# Patient Record
Sex: Female | Born: 1965
Health system: Southern US, Community
[De-identification: ages and names within clinical notes are randomized; demographics above are authoritative.]

## PROBLEM LIST (undated history)

## (undated) DIAGNOSIS — D219 Benign neoplasm of connective and other soft tissue, unspecified: Secondary | ICD-10-CM

## (undated) DIAGNOSIS — K581 Irritable bowel syndrome with constipation: Secondary | ICD-10-CM

## (undated) DIAGNOSIS — IMO0001 Reserved for inherently not codable concepts without codable children: Secondary | ICD-10-CM

## (undated) DIAGNOSIS — F329 Major depressive disorder, single episode, unspecified: Secondary | ICD-10-CM

## (undated) DIAGNOSIS — J45991 Cough variant asthma: Secondary | ICD-10-CM

## (undated) DIAGNOSIS — N838 Other noninflammatory disorders of ovary, fallopian tube and broad ligament: Secondary | ICD-10-CM

## (undated) DIAGNOSIS — F411 Generalized anxiety disorder: Secondary | ICD-10-CM

## (undated) DIAGNOSIS — R102 Pelvic and perineal pain unspecified side: Secondary | ICD-10-CM

## (undated) DIAGNOSIS — D860 Sarcoidosis of lung: Secondary | ICD-10-CM

## (undated) DIAGNOSIS — K219 Gastro-esophageal reflux disease without esophagitis: Secondary | ICD-10-CM

## (undated) DIAGNOSIS — K589 Irritable bowel syndrome without diarrhea: Secondary | ICD-10-CM

## (undated) DIAGNOSIS — Z86018 Personal history of other benign neoplasm: Secondary | ICD-10-CM

## (undated) DIAGNOSIS — J45909 Unspecified asthma, uncomplicated: Secondary | ICD-10-CM

## (undated) DIAGNOSIS — F988 Other specified behavioral and emotional disorders with onset usually occurring in childhood and adolescence: Secondary | ICD-10-CM

## (undated) HISTORY — DX: Benign neoplasm of connective and other soft tissue, unspecified: D21.9

## (undated) HISTORY — PX: WISDOM TOOTH EXTRACTION: SHX21

---

## 1998-11-30 ENCOUNTER — Other Ambulatory Visit: Admission: RE | Admit: 1998-11-30 | Discharge: 1998-11-30 | Payer: Self-pay | Admitting: *Deleted

## 1999-01-17 ENCOUNTER — Emergency Department (HOSPITAL_COMMUNITY): Admission: EM | Admit: 1999-01-17 | Discharge: 1999-01-17 | Payer: Self-pay | Admitting: Emergency Medicine

## 1999-01-17 ENCOUNTER — Encounter: Payer: Self-pay | Admitting: Emergency Medicine

## 1999-12-20 ENCOUNTER — Other Ambulatory Visit: Admission: RE | Admit: 1999-12-20 | Discharge: 1999-12-20 | Payer: Self-pay | Admitting: *Deleted

## 2000-01-30 ENCOUNTER — Other Ambulatory Visit: Admission: RE | Admit: 2000-01-30 | Discharge: 2000-01-30 | Payer: Self-pay | Admitting: Obstetrics and Gynecology

## 2000-01-30 ENCOUNTER — Encounter (INDEPENDENT_AMBULATORY_CARE_PROVIDER_SITE_OTHER): Payer: Self-pay

## 2000-05-29 ENCOUNTER — Other Ambulatory Visit: Admission: RE | Admit: 2000-05-29 | Discharge: 2000-05-29 | Payer: Self-pay | Admitting: *Deleted

## 2000-07-03 ENCOUNTER — Encounter: Payer: Self-pay | Admitting: *Deleted

## 2000-07-03 ENCOUNTER — Encounter: Admission: RE | Admit: 2000-07-03 | Discharge: 2000-07-03 | Payer: Self-pay | Admitting: *Deleted

## 2000-11-03 ENCOUNTER — Other Ambulatory Visit: Admission: RE | Admit: 2000-11-03 | Discharge: 2000-11-03 | Payer: Self-pay | Admitting: Obstetrics and Gynecology

## 2006-02-07 ENCOUNTER — Emergency Department (HOSPITAL_COMMUNITY): Admission: EM | Admit: 2006-02-07 | Discharge: 2006-02-07 | Payer: Self-pay | Admitting: Family Medicine

## 2006-09-20 ENCOUNTER — Emergency Department (HOSPITAL_COMMUNITY): Admission: EM | Admit: 2006-09-20 | Discharge: 2006-09-20 | Payer: Self-pay | Admitting: Emergency Medicine

## 2012-04-01 DIAGNOSIS — D860 Sarcoidosis of lung: Secondary | ICD-10-CM

## 2012-04-01 HISTORY — DX: Sarcoidosis of lung: D86.0

## 2012-08-30 DIAGNOSIS — Z87898 Personal history of other specified conditions: Secondary | ICD-10-CM

## 2012-08-30 HISTORY — DX: Personal history of other specified conditions: Z87.898

## 2012-09-25 ENCOUNTER — Emergency Department (HOSPITAL_COMMUNITY)
Admission: EM | Admit: 2012-09-25 | Discharge: 2012-09-25 | Disposition: A | Discharge status: Home / Self Care | Payer: 59 | Attending: Emergency Medicine | Admitting: Emergency Medicine

## 2012-09-25 ENCOUNTER — Encounter (HOSPITAL_COMMUNITY): Payer: Self-pay | Admitting: *Deleted

## 2012-09-25 DIAGNOSIS — R339 Retention of urine, unspecified: Secondary | ICD-10-CM | POA: Insufficient documentation

## 2012-09-25 DIAGNOSIS — Z3202 Encounter for pregnancy test, result negative: Secondary | ICD-10-CM | POA: Insufficient documentation

## 2012-09-25 DIAGNOSIS — Z79899 Other long term (current) drug therapy: Secondary | ICD-10-CM | POA: Insufficient documentation

## 2012-09-25 DIAGNOSIS — J45909 Unspecified asthma, uncomplicated: Secondary | ICD-10-CM | POA: Insufficient documentation

## 2012-09-25 HISTORY — DX: Unspecified asthma, uncomplicated: J45.909

## 2012-09-25 LAB — URINALYSIS, ROUTINE W REFLEX MICROSCOPIC
Bilirubin Urine: NEGATIVE
Ketones, ur: NEGATIVE mg/dL
Leukocytes, UA: NEGATIVE
Nitrite: NEGATIVE
Urobilinogen, UA: 0.2 mg/dL (ref 0.0–1.0)
pH: 6 (ref 5.0–8.0)

## 2012-09-25 NOTE — ED Provider Notes (Signed)
History    CSN: [id number] Arrival date & time 09/25/12  1916  First MD Initiated Contact with Patient 09/25/12 1921     Chief Complaint  Patient presents with  . Urinary Retention   (Consider location/radiation/quality/duration/timing/severity/associated sxs/prior Treatment) HPI Comments: Pt presents with acute onset of urinary retention which started at 4 PM this afternoon when trying to urinate - she has never had this problem in the past, has no new medications and no dysuria / fever / n/v. She has no back pain.  Sx are constant, nothing makes better or worse.  Denies being pregnant - has never been pregnant.  The history is provided by the patient and the spouse.   Past Medical History  Diagnosis Date  . Asthma    History reviewed. No pertinent past surgical history. No family history on file. History  Substance Use Topics  . Smoking status: Never Smoker   . Smokeless tobacco: Not on file  . Alcohol Use: Not on file     Comment: occassional   OB History   Grav Para Term Preterm Abortions TAB SAB Ect Mult Living                 Review of Systems  All other systems reviewed and are negative.    Allergies  Excedrin back &  Home Medications   Current Outpatient Rx  Name  Route  Sig  Dispense  Refill  . albuterol (PROVENTIL HFA) 108 (90 BASE) MCG/ACT inhaler   Inhalation   Inhale 2 puffs into the lungs every 6 (six) hours as needed for wheezing or shortness of breath.         . Calcium Carbonate-Vitamin D (CALCIUM 600 + D PO)   Oral   Take 1 tablet by mouth daily.         . Multiple Vitamin (MULTIVITAMIN WITH MINERALS) TABS   Oral   Take 1 tablet by mouth daily.         . Phenazopyridine HCl (AZO TABS PO)   Oral   Take 1 tablet by mouth daily as needed (FOR URINARY PAIN RELIEF).          BP 118/66  Pulse 79  Temp(Src) 98.7 F (37.1 C) (Oral)  Resp 20  Ht 5\' 6"  (1.676 m)  Wt 125 lb (56.7 kg)  BMI 20.19 kg/m2  SpO2 99%  LMP  09/18/2012 Physical Exam  Nursing note and vitals reviewed. Constitutional: She appears well-developed and well-nourished.  Uncomfortable appearing  HENT:  Head: Normocephalic and atraumatic.  Mouth/Throat: Oropharynx is clear and moist. No oropharyngeal exudate.  Eyes: Conjunctivae and EOM are normal. Pupils are equal, round, and reactive to light. Right eye exhibits no discharge. Left eye exhibits no discharge. No scleral icterus.  Neck: Normal range of motion. Neck supple. No JVD present. No thyromegaly present.  Cardiovascular: Normal rate, regular rhythm, normal heart sounds and intact distal pulses.  Exam reveals no gallop and no friction rub.   No murmur heard. Pulmonary/Chest: Effort normal and breath sounds normal. No respiratory distress. She has no wheezes. She has no rales.  Abdominal: Soft. Bowel sounds are normal. She exhibits distension. She exhibits no mass. There is tenderness.  Pt has distended abdomen which is in the SP area which is ttp but no guarding or peritoneal signs.  Musculoskeletal: Normal range of motion. She exhibits no edema and no tenderness.  Lymphadenopathy:    She has no cervical adenopathy.  Neurological: She is alert. Coordination normal.  Skin: Skin is warm and dry. No rash noted. No erythema.  Psychiatric: She has a normal mood and affect. Her behavior is normal.    ED Course  Procedures (including critical care time) Labs Reviewed  URINALYSIS, ROUTINE W REFLEX MICROSCOPIC - Abnormal; Notable for the following:    Hgb urine dipstick MODERATE (*)    All other components within normal limits  PREGNANCY, URINE  URINE MICROSCOPIC-ADD ON   No results found. 1. Urinary retention     MDM  Acute urinary retention Foley uA Recheck  No obvious source on exam, r/o UTI  of clear Urine returned on foley placement - with chaperone present, perineum was inspected,  No obvious urethral obstruction.  Foley in place - pt given urology f/u  instruction and foley care instrucions, expressed understanding, mild hematuria likely from palcement - nurse X 3 attempts pe nurse report to me.  Vida Roller, MD 09/25/12 2056

## 2012-09-25 NOTE — ED Notes (Signed)
Unable to urinate

## 2012-09-29 ENCOUNTER — Other Ambulatory Visit (HOSPITAL_COMMUNITY): Payer: Self-pay | Admitting: Urology

## 2012-09-29 ENCOUNTER — Ambulatory Visit (HOSPITAL_COMMUNITY)
Admission: RE | Admit: 2012-09-29 | Discharge: 2012-09-29 | Disposition: A | Discharge status: Home / Self Care | Payer: 59 | Source: Ambulatory Visit | Attending: Urology | Admitting: Urology

## 2012-09-29 DIAGNOSIS — R339 Retention of urine, unspecified: Secondary | ICD-10-CM

## 2012-09-29 DIAGNOSIS — R109 Unspecified abdominal pain: Secondary | ICD-10-CM | POA: Insufficient documentation

## 2012-09-29 DIAGNOSIS — D259 Leiomyoma of uterus, unspecified: Secondary | ICD-10-CM | POA: Insufficient documentation

## 2012-09-29 DIAGNOSIS — N83209 Unspecified ovarian cyst, unspecified side: Secondary | ICD-10-CM | POA: Insufficient documentation

## 2012-09-29 DIAGNOSIS — R911 Solitary pulmonary nodule: Secondary | ICD-10-CM | POA: Insufficient documentation

## 2012-09-29 DIAGNOSIS — N1330 Unspecified hydronephrosis: Secondary | ICD-10-CM | POA: Insufficient documentation

## 2012-09-29 MED ORDER — IOHEXOL 300 MG/ML  SOLN
125.0000 mL | Freq: Once | INTRAMUSCULAR | Status: AC | PRN
  Administered 2012-09-29: 125 mL via INTRAVENOUS

## 2012-10-01 ENCOUNTER — Other Ambulatory Visit (HOSPITAL_COMMUNITY)
Admission: RE | Admit: 2012-10-01 | Discharge: 2012-10-01 | Disposition: A | Discharge status: Home / Self Care | Payer: 59 | Source: Ambulatory Visit | Attending: Obstetrics and Gynecology | Admitting: Obstetrics and Gynecology

## 2012-10-01 ENCOUNTER — Ambulatory Visit (INDEPENDENT_AMBULATORY_CARE_PROVIDER_SITE_OTHER): Payer: 59 | Admitting: Obstetrics and Gynecology

## 2012-10-01 ENCOUNTER — Encounter: Payer: Self-pay | Admitting: Obstetrics and Gynecology

## 2012-10-01 DIAGNOSIS — Z01419 Encounter for gynecological examination (general) (routine) without abnormal findings: Secondary | ICD-10-CM

## 2012-10-01 DIAGNOSIS — R339 Retention of urine, unspecified: Secondary | ICD-10-CM

## 2012-10-01 DIAGNOSIS — Z1151 Encounter for screening for human papillomavirus (HPV): Secondary | ICD-10-CM | POA: Insufficient documentation

## 2012-10-01 DIAGNOSIS — N854 Malposition of uterus: Secondary | ICD-10-CM

## 2012-10-01 DIAGNOSIS — D259 Leiomyoma of uterus, unspecified: Secondary | ICD-10-CM

## 2012-10-01 MED ORDER — LORAZEPAM 0.5 MG PO TABS: 0.5000 mg | ORAL_TABLET | Freq: Three times a day (TID) | ORAL | Status: DC

## 2012-10-01 NOTE — Progress Notes (Signed)
   [facility] Visit  Patient name: Kathleen Key MRN [id number]  Date of birth: September 23, 1965  CC & HPI:  Kathleen Key is a 47 y.o. female presenting today for referral for fibroids, urinary retention.Exam shows acute uterine retroversion, with the cervix so anterior as to be producing urinary retention. The uterus is impacted in a severely retroverted position, and I will have her come back later today after taking some pain meds, for an attempt to shift the position of the utereus.  Will use paracervical block and tenaculum.  ROS:  No pap in over 5 yrs. Needed today  or soon  Pertinent History Reviewed:  Medical & Surgical Hx:  Reviewed: Significant for benign Medications: Reviewed & Updated - see associated section Social History: Reviewed -  reports that she has never smoked. She does not have any smokeless tobacco history on file.  Objective Findings:  Vitals: LMP 09/18/2012 LMP 09/18/2012   Physical Examination: General appearance - alert, well appearing, and in no distress, oriented to person, place, and time, in mild to moderate distress and chronically ill appearing Eyes - pupils equal and reactive, extraocular eye movements intact Mouth - mucous membranes moist, pharynx normal without lesions Neck - supple, no significant adenopathy Abdomen - soft, nontender, nondistended, no masses or organomegaly Suprapubic pain from uterine retroversion Pelvic - normal external genitalia, vulva, vagina, cervix, uterus and adnexa, UTERUS: retroverted, enlarged to 12-14 week's size, fixed    Assessment & Plan:   Uterine fibroids, uterine retroversion,  Have pt retur n today 3 pm for attempt at flipping uterus out of retroflweed position Obtain Pap If unsuccessful, leave foley in And schedule surgery

## 2012-10-05 ENCOUNTER — Telehealth: Payer: Self-pay | Admitting: Obstetrics and Gynecology

## 2012-10-06 ENCOUNTER — Telehealth: Payer: Self-pay | Admitting: Obstetrics and Gynecology

## 2012-10-06 ENCOUNTER — Telehealth: Payer: Self-pay | Admitting: *Deleted

## 2012-10-06 NOTE — Telephone Encounter (Signed)
Pt informed per Dr. Emelda Fear okay to return to work. Pt verbalized understanding. Pt does have an appt this week.

## 2012-10-06 NOTE — Telephone Encounter (Signed)
Pt state work # is best # to reach her at.

## 2012-10-06 NOTE — Telephone Encounter (Signed)
Appointment scheduled this week for endometrial biopsy per Dr. Emelda Fear.

## 2012-10-08 ENCOUNTER — Encounter (HOSPITAL_COMMUNITY): Payer: Self-pay

## 2012-10-08 ENCOUNTER — Ambulatory Visit (INDEPENDENT_AMBULATORY_CARE_PROVIDER_SITE_OTHER): Payer: 59 | Admitting: Obstetrics and Gynecology

## 2012-10-08 ENCOUNTER — Encounter (HOSPITAL_COMMUNITY)
Admission: RE | Admit: 2012-10-08 | Discharge: 2012-10-08 | Disposition: A | Discharge status: Home / Self Care | Payer: 59 | Source: Ambulatory Visit | Attending: Obstetrics and Gynecology | Admitting: Obstetrics and Gynecology

## 2012-10-08 ENCOUNTER — Encounter: Payer: Self-pay | Admitting: Obstetrics and Gynecology

## 2012-10-08 VITALS — BP 130/78 | Ht 66.0 in | Wt 123.0 lb

## 2012-10-08 DIAGNOSIS — Z3202 Encounter for pregnancy test, result negative: Secondary | ICD-10-CM

## 2012-10-08 DIAGNOSIS — N854 Malposition of uterus: Secondary | ICD-10-CM

## 2012-10-08 DIAGNOSIS — Z029 Encounter for administrative examinations, unspecified: Secondary | ICD-10-CM

## 2012-10-08 HISTORY — DX: Irritable bowel syndrome without diarrhea: K58.9

## 2012-10-08 LAB — COMPREHENSIVE METABOLIC PANEL
AST: 16 U/L (ref 0–37)
BUN: 14 mg/dL (ref 6–23)
CO2: 28 mEq/L (ref 19–32)
Calcium: 9.2 mg/dL (ref 8.4–10.5)
Chloride: 102 mEq/L (ref 96–112)
Creatinine, Ser: 0.57 mg/dL (ref 0.50–1.10)
GFR calc Af Amer: >90 mL/min (ref >90)
GFR calc non Af Amer: >90 mL/min (ref >90)
Glucose, Bld: 114 mg/dL — ABNORMAL HIGH (ref 70–99)
Total Bilirubin: 0.3 mg/dL (ref 0.3–1.2)

## 2012-10-08 LAB — CBC
HCT: 40.3 % (ref 36.0–46.0)
Hemoglobin: 13.6 g/dL (ref 12.0–15.0)
MCH: 31.8 pg (ref 26.0–34.0)
MCV: 94.2 fL (ref 78.0–100.0)
Platelets: 310 10*3/uL (ref 150–400)
RBC: 4.28 MIL/uL (ref 3.87–5.11)
WBC: 5.8 10*3/uL (ref 4.0–10.5)

## 2012-10-08 LAB — URINALYSIS, ROUTINE W REFLEX MICROSCOPIC
Bilirubin Urine: NEGATIVE
Glucose, UA: NEGATIVE mg/dL
Protein, ur: NEGATIVE mg/dL

## 2012-10-08 LAB — URINE MICROSCOPIC-ADD ON

## 2012-10-08 NOTE — Progress Notes (Signed)
Patient ID: GERTRUE WILLETTE, female   DOB: 05-17-1965, 47 y.o.   MRN: [id number]    [facility] Visit  Patient name: Kathleen Key MRN [id number]  Date of birth: June 24, 1965  CC & HPI:  SHAUNITA SENEY is a 47 y.o. female presenting today for endometrial biopsy and preop Pt here today for Endometrial Biopsy for possible surgery due to Uterine Fibroids.  UNable to complete endometrial biopsy, as the uterus has again resumed it's severely retroverted, retroflexed position, and cervix is inaccessible on speculum exam for endometrial biopsy. ROS:  Still able to void,  Pertinent History Reviewed:  Medical & Surgical Hx:  Reviewed: Significant for cesarean section Medications: Reviewed & Updated - see associated section Social History: Reviewed -  reports that she has never smoked. She does not have any smokeless tobacco history on file.  Objective Findings:  Vitals: BP 130/78  Ht 5\' 6"  (1.676 m)  Wt 123 lb (55.792 kg)  BMI 19.86 kg/m2  LMP 10/08/2012  Physical Examination: General appearance - oriented to person, place, and time, overweight and in mild to moderate distress Mental status - alert, oriented to person, place, and time, normal mood, behavior, speech, dress, motor activity, and thought processes Eyes - pupils equal and reactive, extraocular eye movements intact Neck - supple, no significant adenopathy Chest - clear to auscultation, no wheezes, rales or rhonchi, symmetric air entry Heart - normal rate, regular rhythm, normal S1, S2, no murmurs, rubs, clicks or gallops, S4 present Abdomen - soft, nontender, nondistended, no masses or organomegaly tenderness noted suprapubic Pelvic - VULVA: normal appearing vulva with no masses, tenderness or lesions, VAGINA: normal appearing vagina with normal color and discharge, no lesions, vagina is markedly oriented anteriorly due to the large fibroid uterus that is posteriorly filling the pelvis in a retroverted position, CERVIX: normal  appearing cervix without discharge or lesions, distorted anteriorly unable to do endometrial biopsy due to the marked retroverted uterus and anteverted cervix., UTERUS: enlarged to 12 weeks week's size, ADNEXA: unable to identify any enlargement of uterus, exam limited by uterine retroversion Extremities - peripheral pulses normal, no pedal edema, no clubbing or cyanosis, Homan's sign negative bilaterally   Assessment & Plan:   Uterine retroversion,  Uterine fibroids Plan  Surgery options discussed , will proceed to Total abdominal hysterectomy, with removal of cervix, and bilateral salpingectomy

## 2012-10-08 NOTE — Patient Instructions (Addendum)
Kathleen Key  10/08/2012   Your procedure is scheduled on:  10/13/12  Report to [facility] at 06:15 AM.  Call this number if you have problems the morning of surgery: [phone]   Remember:   Do not eat food or drink liquids after midnight.   Take these medicines the morning of surgery with A SIP OF WATER: Oxycodone and Lorazepam if needed. Use your Albuterol inhaler before coming for surgery.   Do not wear jewelry, make-up or nail polish.  Do not wear lotions, powders, or perfumes.   Do not shave 48 hours prior to surgery. Men may shave face and neck.  Do not bring valuables to the hospital.  [facility] is not responsible for any belongings or valuables.  Contacts, dentures or bridgework may not be worn into surgery.  Leave suitcase in the car. After surgery it may be brought to your room.  For patients admitted to the hospital, checkout time is 11:00 AM the day of discharge.   Patients discharged the day of surgery will not be allowed to drive home.    Special Instructions: Shower using CHG 2 nights before surgery and the night before surgery.  If you shower the day of surgery use CHG.  Use special wash - you have one bottle of CHG for all showers.  You should use approximately 1/3 of the bottle for each shower.   Please read over the following fact sheets that you were given: Pain Booklet, Surgical Site Infection Prevention, Anesthesia Post-op Instructions and Care and Recovery After Surgery    Hysterectomy Information  A hysterectomy is a procedure where your uterus is surgically removed. It will no longer be possible to have menstrual periods or to become pregnant. The tubes and ovaries can be removed (bilateral salpingo-oopherectomy) during this surgery as well.  REASONS FOR A HYSTERECTOMY  Persistent, abnormal bleeding.  Lasting (chronic) pelvic pain or infection.  The lining of the uterus (endometrium) starts growing outside the uterus (endometriosis).  The endometrium  starts growing in the muscle of the uterus (adenomyosis).  The uterus falls down into the vagina (pelvic organ prolapse).  Symptomatic uterine fibroids.  Precancerous cells.  Cervical cancer or uterine cancer. TYPES OF HYSTERECTOMIES  Supracervical hysterectomy. This type removes the top part of the uterus, but not the cervix.  Total hysterectomy. This type removes the uterus and cervix.  Radical hysterectomy. This type removes the uterus, cervix, and the fibrous tissue that holds the uterus in place in the pelvis (parametrium). WAYS A HYSTERECTOMY CAN BE PERFORMED  Abdominal hysterectomy. A large surgical cut (incision) is made in the abdomen. The uterus is removed through this incision.  Vaginal hysterectomy. An incision is made in the vagina. The uterus is removed through this incision. There are no abdominal incisions.  Conventional laparoscopic hysterectomy. A thin, lighted tube with a camera (laparoscope) is inserted into 3 or 4 small incisions in the abdomen. The uterus is cut into small pieces. The small pieces are removed through the incisions, or they are removed through the vagina.  Laparoscopic assisted vaginal hysterectomy (LAVH). Three or four small incisions are made in the abdomen. Part of the surgery is performed laparoscopically and part vaginally. The uterus is removed through the vagina.  Robot-assisted laparoscopic hysterectomy. A laparoscope is inserted into 3 or 4 small incisions in the abdomen. A computer-controlled device is used to give the surgeon a 3D image. This allows for more precise movements of surgical instruments. The uterus is cut into small  pieces and removed through the incisions or removed through the vagina. RISKS OF HYSTERECTOMY   Bleeding and risk of blood transfusion. Tell your caregiver if you do not want to receive any blood products.  Blood clots in the legs or lung.  Infection.  Injury to surrounding organs.  Anesthesia problems or  side effects.  Conversion to an abdominal hysterectomy. WHAT TO EXPECT AFTER A HYSTERECTOMY  You will be given pain medicine.  You will need to have someone with you for the first 3 to 5 days after you go home.  You will need to follow up with your surgeon in 2 to 4 weeks after surgery to evaluate your progress.  You may have early menopause symptoms like hot flashes, night sweats, and insomnia.  If you had a hysterectomy for a problem that was not a cancer or a condition that could lead to cancer, then you no longer need Pap tests. However, even if you no longer need a Pap test, a regular exam is a good idea to make sure no other problems are starting. Document Released: 09/11/2000 Document Revised: 06/10/2011 Document Reviewed: 10/27/2010 [facility] Patient Information 2014 [place], [place].    PATIENT INSTRUCTIONS POST-ANESTHESIA  IMMEDIATELY FOLLOWING SURGERY:  Do not drive or operate machinery for the first twenty four hours after surgery.  Do not make any important decisions for twenty four hours after surgery or while taking narcotic pain medications or sedatives.  If you develop intractable nausea and vomiting or a severe headache please notify your doctor immediately.  FOLLOW-UP:  Please make an appointment with your surgeon as instructed. You do not need to follow up with anesthesia unless specifically instructed to do so.  WOUND CARE INSTRUCTIONS (if applicable):  Keep a dry clean dressing on the anesthesia/puncture wound site if there is drainage.  Once the wound has quit draining you may leave it open to air.  Generally you should leave the bandage intact for twenty four hours unless there is drainage.  If the epidural site drains for more than 36-48 hours please call the anesthesia department.  QUESTIONS?:  Please feel free to call your physician or the hospital operator if you have any questions, and they will be happy to assist you.

## 2012-10-10 LAB — TYPE AND SCREEN: ABO/RH(D): O POS

## 2012-10-12 NOTE — H&P (Signed)
   [facility] Visit  Patient name: Kathleen Key MRN [id number]  Date of birth: 07-Jul-1965  CC & HPI:  Kathleen Key is a 47 y.o. female presenting today for endometrial biopsy and preop Pt here today for Endometrial Biopsy for possible surgery due to Uterine Fibroids.  UNable to complete endometrial biopsy, as the uterus has again resumed it's severely retroverted, retroflexed position, and cervix is inaccessible on speculum exam for endometrial biopsy. ROS:  Still able to void,  Pertinent History Reviewed:  Medical & Surgical Hx:  Reviewed: Significant for cesarean section Medications: Reviewed & Updated - see associated section Social History: Reviewed -  reports that she has never smoked. She does not have any smokeless tobacco history on file.  Objective Findings:  Vitals: BP 130/78  Ht 5\' 6"  (1.676 m)  Wt 123 lb (55.792 kg)  BMI 19.86 kg/m2  LMP 10/08/2012  Physical Examination: General appearance - oriented to person, place, and time, overweight and in mild to moderate distress Mental status - alert, oriented to person, place, and time, normal mood, behavior, speech, dress, motor activity, and thought processes Eyes - pupils equal and reactive, extraocular eye movements intact Neck - supple, no significant adenopathy Chest - clear to auscultation, no wheezes, rales or rhonchi, symmetric air entry Heart - normal rate, regular rhythm, normal S1, S2, no murmurs, rubs, clicks or gallops, S4 present Abdomen - soft, nontender, nondistended, no masses or organomegaly tenderness noted suprapubic Pelvic - VULVA: normal appearing vulva with no masses, tenderness or lesions, VAGINA: normal appearing vagina with normal color and discharge, no lesions, vagina is markedly oriented anteriorly due to the large fibroid uterus that is posteriorly filling the pelvis in a retroverted position, CERVIX: normal appearing cervix without discharge or lesions, distorted anteriorly unable to do  endometrial biopsy due to the marked retroverted uterus and anteverted cervix., UTERUS: enlarged to 12 weeks week's size, ADNEXA: unable to identify any enlargement of uterus, exam limited by uterine retroversion Extremities - peripheral pulses normal, no pedal edema, no clubbing or cyanosis, Homan's sign negative bilaterally   Assessment & Plan:   Uterine retroversion,  Uterine fibroids Plan  Surgery options discussed , will proceed to Total abdominal hysterectomy, with removal of cervix, and bilateral salpingectomy

## 2012-10-13 ENCOUNTER — Encounter (HOSPITAL_COMMUNITY)
Admission: RE | Disposition: A | Discharge status: Home / Self Care | Payer: Self-pay | Source: Ambulatory Visit | Attending: Obstetrics and Gynecology

## 2012-10-13 ENCOUNTER — Encounter (HOSPITAL_COMMUNITY): Payer: Self-pay | Admitting: Anesthesiology

## 2012-10-13 ENCOUNTER — Encounter (HOSPITAL_COMMUNITY): Payer: Self-pay | Admitting: *Deleted

## 2012-10-13 ENCOUNTER — Ambulatory Visit (HOSPITAL_COMMUNITY): Payer: 59 | Admitting: Anesthesiology

## 2012-10-13 ENCOUNTER — Inpatient Hospital Stay (HOSPITAL_COMMUNITY)
Admission: RE | Admit: 2012-10-13 | Discharge: 2012-10-15 | DRG: 743 | Disposition: A | Discharge status: Home / Self Care | Payer: 59 | Source: Ambulatory Visit | Attending: Obstetrics and Gynecology | Admitting: Obstetrics and Gynecology

## 2012-10-13 DIAGNOSIS — N838 Other noninflammatory disorders of ovary, fallopian tube and broad ligament: Secondary | ICD-10-CM

## 2012-10-13 DIAGNOSIS — N809 Endometriosis, unspecified: Secondary | ICD-10-CM | POA: Diagnosis present

## 2012-10-13 DIAGNOSIS — L299 Pruritus, unspecified: Secondary | ICD-10-CM | POA: Diagnosis present

## 2012-10-13 DIAGNOSIS — D251 Intramural leiomyoma of uterus: Secondary | ICD-10-CM

## 2012-10-13 DIAGNOSIS — D259 Leiomyoma of uterus, unspecified: Principal | ICD-10-CM | POA: Diagnosis present

## 2012-10-13 DIAGNOSIS — T40605A Adverse effect of unspecified narcotics, initial encounter: Secondary | ICD-10-CM | POA: Diagnosis present

## 2012-10-13 DIAGNOSIS — R109 Unspecified abdominal pain: Secondary | ICD-10-CM

## 2012-10-13 DIAGNOSIS — T8140XA Infection following a procedure, unspecified, initial encounter: Secondary | ICD-10-CM

## 2012-10-13 DIAGNOSIS — N854 Malposition of uterus: Secondary | ICD-10-CM | POA: Diagnosis present

## 2012-10-13 DIAGNOSIS — N993 Prolapse of vaginal vault after hysterectomy: Secondary | ICD-10-CM

## 2012-10-13 HISTORY — PX: BILATERAL SALPINGECTOMY: SHX5743

## 2012-10-13 HISTORY — PX: ABDOMINAL HYSTERECTOMY: SHX81

## 2012-10-13 SURGERY — HYSTERECTOMY, ABDOMINAL
Anesthesia: General | Site: Abdomen | Wound class: Clean Contaminated

## 2012-10-13 MED ORDER — HYDROMORPHONE 0.3 MG/ML IV SOLN
INTRAVENOUS | Status: DC
  Administered 2012-10-14: 0.9 mg via INTRAVENOUS
  Filled 2012-10-13: qty 25

## 2012-10-13 MED ORDER — SODIUM CHLORIDE 0.9 % IJ SOLN: 9.0000 mL | INTRAMUSCULAR | Status: DC | PRN

## 2012-10-13 MED ORDER — MIDAZOLAM HCL 2 MG/2ML IJ SOLN
INTRAMUSCULAR | Status: AC
  Filled 2012-10-13: qty 2

## 2012-10-13 MED ORDER — NALOXONE HCL 0.4 MG/ML IJ SOLN: 0.4000 mg | INTRAMUSCULAR | Status: DC | PRN

## 2012-10-13 MED ORDER — LORAZEPAM 0.5 MG PO TABS: 0.5000 mg | ORAL_TABLET | Freq: Three times a day (TID) | ORAL | Status: DC

## 2012-10-13 MED ORDER — ROCURONIUM BROMIDE 50 MG/5ML IV SOLN
INTRAVENOUS | Status: AC
  Filled 2012-10-13: qty 1

## 2012-10-13 MED ORDER — LORAZEPAM 0.5 MG PO TABS
0.5000 mg | ORAL_TABLET | Freq: Three times a day (TID) | ORAL | Status: DC | PRN
  Administered 2012-10-14: 0.5 mg via ORAL
  Filled 2012-10-13: qty 1

## 2012-10-13 MED ORDER — SCOPOLAMINE 1 MG/3DAYS TD PT72
1.0000 | MEDICATED_PATCH | TRANSDERMAL | Status: DC
  Administered 2012-10-13: 1.5 mg via TRANSDERMAL

## 2012-10-13 MED ORDER — GLYCOPYRROLATE 0.2 MG/ML IJ SOLN
INTRAMUSCULAR | Status: DC | PRN
  Administered 2012-10-13: .5 mg via INTRAVENOUS

## 2012-10-13 MED ORDER — DEXAMETHASONE SODIUM PHOSPHATE 4 MG/ML IJ SOLN
INTRAMUSCULAR | Status: AC
  Filled 2012-10-13: qty 1

## 2012-10-13 MED ORDER — DEXAMETHASONE SODIUM PHOSPHATE 4 MG/ML IJ SOLN
4.0000 mg | Freq: Once | INTRAMUSCULAR | Status: AC
  Administered 2012-10-13: 4 mg via INTRAVENOUS

## 2012-10-13 MED ORDER — FENTANYL CITRATE 0.05 MG/ML IJ SOLN
25.0000 ug | INTRAMUSCULAR | Status: DC | PRN
  Administered 2012-10-13: 50 ug via INTRAVENOUS
  Administered 2012-10-13 (×2): 25 ug via INTRAVENOUS
  Administered 2012-10-13: 50 ug via INTRAVENOUS
  Administered 2012-10-13: 25 ug via INTRAVENOUS

## 2012-10-13 MED ORDER — TRAMADOL HCL 50 MG PO TABS
50.0000 mg | ORAL_TABLET | Freq: Four times a day (QID) | ORAL | Status: DC | PRN
  Administered 2012-10-13 – 2012-10-15 (×6): 50 mg via ORAL
  Filled 2012-10-13 (×6): qty 1

## 2012-10-13 MED ORDER — DIPHENHYDRAMINE HCL 12.5 MG/5ML PO ELIX
12.5000 mg | ORAL_SOLUTION | Freq: Four times a day (QID) | ORAL | Status: DC | PRN
  Administered 2012-10-14: 12.5 mg via ORAL
  Filled 2012-10-13: qty 5

## 2012-10-13 MED ORDER — ONDANSETRON HCL 4 MG/2ML IJ SOLN
INTRAMUSCULAR | Status: AC
  Filled 2012-10-13: qty 2

## 2012-10-13 MED ORDER — ONDANSETRON HCL 4 MG/2ML IJ SOLN: 4.0000 mg | Freq: Four times a day (QID) | INTRAMUSCULAR | Status: DC | PRN

## 2012-10-13 MED ORDER — KETOROLAC TROMETHAMINE 30 MG/ML IJ SOLN
30.0000 mg | Freq: Once | INTRAMUSCULAR | Status: DC
  Filled 2012-10-13: qty 1

## 2012-10-13 MED ORDER — GLYCOPYRROLATE 0.2 MG/ML IJ SOLN
INTRAMUSCULAR | Status: AC
  Filled 2012-10-13: qty 2

## 2012-10-13 MED ORDER — CEFAZOLIN SODIUM-DEXTROSE 2-3 GM-% IV SOLR
2.0000 g | INTRAVENOUS | Status: AC
  Administered 2012-10-13: 2 g via INTRAVENOUS

## 2012-10-13 MED ORDER — HYDROMORPHONE 0.3 MG/ML IV SOLN
INTRAVENOUS | Status: DC
  Administered 2012-10-13: 13:00:00 via INTRAVENOUS

## 2012-10-13 MED ORDER — SUFENTANIL CITRATE 50 MCG/ML IV SOLN
INTRAVENOUS | Status: DC | PRN
  Administered 2012-10-13: 5 ug via INTRAVENOUS
  Administered 2012-10-13: 10 ug via INTRAVENOUS
  Administered 2012-10-13: 5 ug via INTRAVENOUS
  Administered 2012-10-13: 15 ug via INTRAVENOUS
  Administered 2012-10-13: 5 ug via INTRAVENOUS
  Administered 2012-10-13: 10 ug via INTRAVENOUS

## 2012-10-13 MED ORDER — SODIUM CHLORIDE 0.9 % IV SOLN
INTRAVENOUS | Status: DC
  Administered 2012-10-13: 1000 mL via INTRAVENOUS
  Administered 2012-10-13: 09:00:00 via INTRAVENOUS

## 2012-10-13 MED ORDER — ONDANSETRON HCL 4 MG/2ML IJ SOLN
4.0000 mg | Freq: Once | INTRAMUSCULAR | Status: AC | PRN
  Administered 2012-10-13: 4 mg via INTRAVENOUS

## 2012-10-13 MED ORDER — MIDAZOLAM HCL 5 MG/5ML IJ SOLN
INTRAMUSCULAR | Status: DC | PRN
  Administered 2012-10-13: 2 mg via INTRAVENOUS

## 2012-10-13 MED ORDER — PROMETHAZINE HCL 25 MG/ML IJ SOLN
INTRAMUSCULAR | Status: AC
  Filled 2012-10-13: qty 1

## 2012-10-13 MED ORDER — ALBUTEROL SULFATE HFA 108 (90 BASE) MCG/ACT IN AERS: 2.0000 | INHALATION_SPRAY | Freq: Four times a day (QID) | RESPIRATORY_TRACT | Status: DC | PRN

## 2012-10-13 MED ORDER — KETOROLAC TROMETHAMINE 30 MG/ML IJ SOLN
30.0000 mg | Freq: Four times a day (QID) | INTRAMUSCULAR | Status: AC
  Administered 2012-10-13 – 2012-10-14 (×3): 30 mg via INTRAVENOUS
  Filled 2012-10-13 (×3): qty 1

## 2012-10-13 MED ORDER — DIPHENHYDRAMINE HCL 50 MG/ML IJ SOLN: 12.5000 mg | Freq: Four times a day (QID) | INTRAMUSCULAR | Status: DC | PRN

## 2012-10-13 MED ORDER — SCOPOLAMINE 1 MG/3DAYS TD PT72
MEDICATED_PATCH | TRANSDERMAL | Status: AC
  Filled 2012-10-13: qty 1

## 2012-10-13 MED ORDER — BUPIVACAINE LIPOSOME 1.3 % IJ SUSP
20.0000 mL | Freq: Once | INTRAMUSCULAR | Status: AC
  Administered 2012-10-13: 20 mL
  Filled 2012-10-13: qty 20

## 2012-10-13 MED ORDER — DIPHENHYDRAMINE HCL 50 MG/ML IJ SOLN
12.5000 mg | Freq: Four times a day (QID) | INTRAMUSCULAR | Status: DC | PRN
  Administered 2012-10-13 – 2012-10-14 (×2): 12.5 mg via INTRAVENOUS
  Filled 2012-10-13 (×3): qty 1

## 2012-10-13 MED ORDER — 0.9 % SODIUM CHLORIDE (POUR BTL) OPTIME
TOPICAL | Status: DC | PRN
  Administered 2012-10-13: 2000 mL

## 2012-10-13 MED ORDER — FENTANYL CITRATE 0.05 MG/ML IJ SOLN
INTRAMUSCULAR | Status: AC
  Filled 2012-10-13: qty 2

## 2012-10-13 MED ORDER — CEFAZOLIN SODIUM-DEXTROSE 2-3 GM-% IV SOLR
INTRAVENOUS | Status: AC
  Filled 2012-10-13: qty 50

## 2012-10-13 MED ORDER — KETOROLAC TROMETHAMINE 30 MG/ML IJ SOLN: 30.0000 mg | Freq: Four times a day (QID) | INTRAMUSCULAR | Status: AC

## 2012-10-13 MED ORDER — HYDROMORPHONE 0.3 MG/ML IV SOLN: INTRAVENOUS | Status: DC

## 2012-10-13 MED ORDER — SODIUM CHLORIDE 0.9 % IJ SOLN
INTRAMUSCULAR | Status: AC
  Filled 2012-10-13: qty 10

## 2012-10-13 MED ORDER — PROMETHAZINE HCL 25 MG/ML IJ SOLN
12.5000 mg | INTRAMUSCULAR | Status: DC | PRN
  Administered 2012-10-13: 12.5 mg via INTRAVENOUS

## 2012-10-13 MED ORDER — SUFENTANIL CITRATE 50 MCG/ML IV SOLN
INTRAVENOUS | Status: AC
  Filled 2012-10-13: qty 1

## 2012-10-13 MED ORDER — ONDANSETRON HCL 4 MG/2ML IJ SOLN
4.0000 mg | Freq: Once | INTRAMUSCULAR | Status: AC
  Administered 2012-10-13: 4 mg via INTRAVENOUS

## 2012-10-13 MED ORDER — DOCUSATE SODIUM 100 MG PO CAPS
100.0000 mg | ORAL_CAPSULE | Freq: Two times a day (BID) | ORAL | Status: DC
  Administered 2012-10-13 – 2012-10-15 (×5): 100 mg via ORAL
  Filled 2012-10-13 (×5): qty 1

## 2012-10-13 MED ORDER — ZOLPIDEM TARTRATE 5 MG PO TABS: 5.0000 mg | ORAL_TABLET | Freq: Every evening | ORAL | Status: DC | PRN

## 2012-10-13 MED ORDER — ROCURONIUM BROMIDE 100 MG/10ML IV SOLN
INTRAVENOUS | Status: DC | PRN
  Administered 2012-10-13: 10 mg via INTRAVENOUS
  Administered 2012-10-13: 40 mg via INTRAVENOUS

## 2012-10-13 MED ORDER — PANTOPRAZOLE SODIUM 40 MG IV SOLR
40.0000 mg | Freq: Every day | INTRAVENOUS | Status: DC
  Administered 2012-10-13: 40 mg via INTRAVENOUS
  Filled 2012-10-13: qty 40

## 2012-10-13 MED ORDER — NEOSTIGMINE METHYLSULFATE 1 MG/ML IJ SOLN
INTRAMUSCULAR | Status: DC | PRN
  Administered 2012-10-13: 2 mg via INTRAVENOUS

## 2012-10-13 MED ORDER — MIDAZOLAM HCL 2 MG/2ML IJ SOLN
1.0000 mg | INTRAMUSCULAR | Status: DC | PRN
  Administered 2012-10-13 (×2): 2 mg via INTRAVENOUS

## 2012-10-13 MED ORDER — HYDROMORPHONE 0.3 MG/ML IV SOLN
INTRAVENOUS | Status: AC
  Filled 2012-10-13: qty 25

## 2012-10-13 MED ORDER — KETOROLAC TROMETHAMINE 30 MG/ML IJ SOLN
INTRAMUSCULAR | Status: AC
  Filled 2012-10-13: qty 1

## 2012-10-13 MED ORDER — PROPOFOL 10 MG/ML IV EMUL
INTRAVENOUS | Status: AC
  Filled 2012-10-13: qty 20

## 2012-10-13 MED ORDER — KCL IN DEXTROSE-NACL 20-5-0.45 MEQ/L-%-% IV SOLN
INTRAVENOUS | Status: DC
  Administered 2012-10-13 – 2012-10-14 (×3): via INTRAVENOUS

## 2012-10-13 MED ORDER — ONDANSETRON HCL 4 MG PO TABS: 4.0000 mg | ORAL_TABLET | Freq: Four times a day (QID) | ORAL | Status: DC | PRN

## 2012-10-13 MED ORDER — PROPOFOL 10 MG/ML IV BOLUS
INTRAVENOUS | Status: DC | PRN
  Administered 2012-10-13: 140 mg via INTRAVENOUS

## 2012-10-13 MED ORDER — DIPHENHYDRAMINE HCL 12.5 MG/5ML PO ELIX: 12.5000 mg | ORAL_SOLUTION | Freq: Four times a day (QID) | ORAL | Status: DC | PRN

## 2012-10-13 SURGICAL SUPPLY — 40 items
BAG HAMPER (MISCELLANEOUS) ×3 IMPLANT
BENZOIN TINCTURE PRP APPL 2/3 (GAUZE/BANDAGES/DRESSINGS) ×3 IMPLANT
CLOTH BEACON ORANGE TIMEOUT ST (SAFETY) ×3 IMPLANT
COVER LIGHT HANDLE STERIS (MISCELLANEOUS) ×6 IMPLANT
DRAPE WARM FLUID 44X44 (DRAPE) ×3 IMPLANT
DRESSING TELFA 8X3 (GAUZE/BANDAGES/DRESSINGS) ×3 IMPLANT
ELECT REM PT RETURN 9FT ADLT (ELECTROSURGICAL) ×3
ELECTRODE REM PT RTRN 9FT ADLT (ELECTROSURGICAL) ×2 IMPLANT
FORMALIN 10 PREFIL 480ML (MISCELLANEOUS) ×3 IMPLANT
GLOVE BIOGEL PI IND STRL 7.0 (GLOVE) ×8 IMPLANT
GLOVE BIOGEL PI INDICATOR 7.0 (GLOVE) ×4
GLOVE ECLIPSE 9.0 STRL (GLOVE) ×3 IMPLANT
GLOVE EXAM NITRILE LRG STRL (GLOVE) ×3 IMPLANT
GLOVE INDICATOR STER SZ 9 (GLOVE) ×3 IMPLANT
GLOVE SS BIOGEL STRL SZ 6.5 (GLOVE) ×4 IMPLANT
GLOVE SUPERSENSE BIOGEL SZ 6.5 (GLOVE) ×2
GOWN STRL REIN 3XL LVL4 (GOWN DISPOSABLE) ×3 IMPLANT
GOWN STRL REIN XL XLG (GOWN DISPOSABLE) ×9 IMPLANT
INST SET MAJOR GENERAL (KITS) ×3 IMPLANT
KIT ROOM TURNOVER APOR (KITS) ×3 IMPLANT
MANIFOLD NEPTUNE II (INSTRUMENTS) ×3 IMPLANT
NEEDLE HYPO 18GX1.5 BLUNT FILL (NEEDLE) ×3 IMPLANT
NEEDLE HYPO 21X1.5 SAFETY (NEEDLE) ×3 IMPLANT
NS IRRIG 1000ML POUR BTL (IV SOLUTION) ×6 IMPLANT
PACK ABDOMINAL MAJOR (CUSTOM PROCEDURE TRAY) ×3 IMPLANT
RETRACTOR WND ALEXIS 25 LRG (MISCELLANEOUS) ×2 IMPLANT
RTRCTR WOUND ALEXIS 25CM LRG (MISCELLANEOUS) ×3
SET BASIN LINEN APH (SET/KITS/TRAYS/PACK) ×3 IMPLANT
SOL PREP PROV IODINE SCRUB 4OZ (MISCELLANEOUS) ×3 IMPLANT
SPONGE GAUZE 4X4 12PLY (GAUZE/BANDAGES/DRESSINGS) ×3 IMPLANT
STRIP CLOSURE SKIN 1/2X4 (GAUZE/BANDAGES/DRESSINGS) ×3 IMPLANT
SUT CHROMIC 0 CT 1 (SUTURE) ×36 IMPLANT
SUT CHROMIC 2 0 CT 1 (SUTURE) ×3 IMPLANT
SUT PLAIN CT 1/2CIR 2-0 27IN (SUTURE) ×6 IMPLANT
SUT VIC AB 0 CT1 27 (SUTURE) ×1
SUT VIC AB 0 CT1 27XBRD ANTBC (SUTURE) ×2 IMPLANT
SUT VICRYL 4 0 KS 27 (SUTURE) ×6 IMPLANT
SYR 20CC LL (SYRINGE) ×3 IMPLANT
TOWEL BLUE STERILE X RAY DET (MISCELLANEOUS) ×3 IMPLANT
TRAY FOLEY CATH 16FR SILVER (SET/KITS/TRAYS/PACK) ×3 IMPLANT

## 2012-10-13 NOTE — Anesthesia Preprocedure Evaluation (Signed)
Anesthesia Evaluation  Patient identified by MRN, date of birth, ID band Patient awake    Reviewed: Allergy & Precautions, H&P , NPO status , Patient's Chart, lab work & pertinent test results  Airway Mallampati: I TM Distance: >3 FB Neck ROM: Full    Dental  (+) Teeth Intact   Pulmonary asthma (rarely needs inhaler) ,  breath sounds clear to auscultation        Cardiovascular negative cardio ROS  Rhythm:Regular Rate:Normal     Neuro/Psych    GI/Hepatic GERD-  Medicated,  Endo/Other    Renal/GU      Musculoskeletal   Abdominal   Peds  Hematology   Anesthesia Other Findings   Reproductive/Obstetrics                           Anesthesia Physical Anesthesia Plan  ASA: II  Anesthesia Plan: General   Post-op Pain Management:    Induction: Intravenous, Rapid sequence and Cricoid pressure planned  Airway Management Planned: Oral ETT  Additional Equipment:   Intra-op Plan:   Post-operative Plan: Extubation in OR  Informed Consent: I have reviewed the patients History and Physical, chart, labs and discussed the procedure including the risks, benefits and alternatives for the proposed anesthesia with the patient or authorized representative who has indicated his/her understanding and acceptance.     Plan Discussed with:   Anesthesia Plan Comments:         Anesthesia Quick Evaluation

## 2012-10-13 NOTE — Brief Op Note (Signed)
10/13/2012  10:15 AM  PATIENT:  Kathleen Key  47 y.o. female  PRE-OPERATIVE DIAGNOSIS:  uterine fibroids 14 week size  POST-OPERATIVE DIAGNOSIS:  uterine fibroids 14-16 weeks  PROCEDURE:  Procedure(s): HYSTERECTOMY ABDOMINAL (N/A) BILATERAL SALPINGECTOMY (Bilateral)  SURGEON:  Surgeon(s) and Role:    * Tilda Burrow, MD - Primary  PHYSICIAN ASSISTANT:   ASSISTANTS: holly protzek RN   ANESTHESIA:   local and general   EBL:  Total I/O In: 2300 [I.V.:2300] Out: 175 [Urine:75; Blood:100]  BLOOD ADMINISTERED:none  DRAINS: Urinary Catheter (Foley)   LOCAL MEDICATIONS USED:  Amount: 20 ml  Exparel  SPECIMEN:  Source of Specimen:  Uterus, cervix, bilateral tubes, with right tube marked with suture  DISPOSITION OF SPECIMEN:  PATHOLOGY  COUNTS:  YES  TOURNIQUET:  * No tourniquets in log *  DICTATION: .Dragon Dictation  PLAN OF CARE: Admit to inpatient   PATIENT DISPOSITION:  PACU - hemodynamically stable.   Delay start of Pharmacological VTE agent (>24hrs) due to surgical blood loss or risk of bleeding: not applicable

## 2012-10-13 NOTE — Anesthesia Postprocedure Evaluation (Signed)
  Anesthesia Post-op Note  Patient: Kathleen Key  Procedure(s) Performed: Procedure(s): HYSTERECTOMY ABDOMINAL (N/A) BILATERAL SALPINGECTOMY (Bilateral)  Patient Location: PACU  Anesthesia Type:General  Level of Consciousness: awake, alert , oriented and patient cooperative  Airway and Oxygen Therapy: Patient Spontanous Breathing  Post-op Pain: 4 /10, mild  Post-op Assessment: Post-op Vital signs reviewed, Patient's Cardiovascular Status Stable, Respiratory Function Stable, Patent Airway, No signs of Nausea or vomiting and Pain level controlled  Post-op Vital Signs: Reviewed and stable  Complications: No apparent anesthesia complications

## 2012-10-13 NOTE — Progress Notes (Signed)
Patient heart rate drop to 47,and respiratory rate 7,patient is on Dilaudid PCA, Dr Emelda Fear notified.Orders received,and given will continue to monitor patient.Family at bedside.No c/o pain or discomfort noted at this time.

## 2012-10-13 NOTE — Plan of Care (Signed)
Problem: Phase I Progression Outcomes Goal: Pain controlled with appropriate interventions Outcome: Completed/Met Date Met:  10/13/12 Patient on PCA dilaudid dose only.

## 2012-10-13 NOTE — Interval H&P Note (Signed)
History and Physical Interval Note:  10/13/2012 7:27 AM  Kathleen Key  has presented today for surgery, with the diagnosis of uterine fibroids  The various methods of treatment have been discussed with the patient and family. After consideration of risks, benefits and other options for treatment, the patient has consented to  Procedure(s): HYSTERECTOMY ABDOMINAL (N/A) BILATERAL SALPINGECTOMY (Bilateral) as a surgical intervention .  The patient's history has been reviewed, patient examined, no change in status, stable for surgery.  I have reviewed the patient's chart and labs.  Questions were answered to the patient's satisfaction.     Kathleen Key

## 2012-10-13 NOTE — Op Note (Signed)
  PATIENT:  Kathleen Key  47 y.o. female  PRE-OPERATIVE DIAGNOSIS:  uterine fibroids 14 week size  POST-OPERATIVE DIAGNOSIS:  uterine fibroids 14-16 weeks  PROCEDURE:  Procedure(s): HYSTERECTOMY ABDOMINAL (N/A) BILATERAL SALPINGECTOMY (Bilateral)  SURGEON:  Surgeon(s) and Role:    * Tilda Burrow, MD - Primary  Details of procedure: Patient was taken to the operating room prepped and draped for lower abdominal surgery with Foley catheter in place. Timeout was conducted and surgical procedure confirmed by operative team. Ancef was administered 2 g intravenously. Pfannenstiel type incision was opened into the abdominal cavity. It was noted the bladder was very high on the anterior abdominal wall, and the uterus was retroverted retroflexed and impacted into the pelvis. We were essentially looking at the cervix. Uterus could be flipped anterior into the incision and exteriorized. Round ligaments were taken down with 0 chromic suture transected and bladder flap developed. Utero-ovarian ligament fallopian tube and upper broad ligament could be isolated clamped cut and suture ligated using Kelly clamps 0 chromic suture. Uterine vessels were skeletonized at this point, and doubly clamped with curved Heaney clamp on the left side, Kelly clamp placed to control backbleeding, transected and doubly ligated with 0 chromic. Upper cardinal ligament was then clamped with straight Heaney clamp, transected and suture ligated. The opposite side was treated similarly. the uterine fundus was amputated off of the lower uterine segment. Cervix could be palpated, and we marched down the sides of the cardinal ligaments clamping cutting and suture ligating until a stab incision could be made into the anterior cervicovaginal fornix and the cervix amputated off of the vaginal cuff, being careful to leave as much of the paracervical ring and supporting tissues as possible. On the posterior cuff the vagina was quite mobile but  there was a thick cuff of paracervical ring tissues present. A small area of endometriosis have been noted on the back of the lower uterine segment and this was removed with the lower uterine segment specimen. Due  the laxity of the vaginal apex support, a Coker clamp was placed on the posterior rim of paracervical tissues, and 2 figure-of-eight sutures placed in a sagittal fashion on the edge of the vaginal cuff in such a way reduce the distance between the insertions of the uterosacral ligaments and this had been effective creating a pouch in the vaginal apex and improving vaginal cuff support. Aldridge stitch was placed at each lateral vaginal angle and then the remainder of the cuff closed with figure-of-eight sutures of 0 chromic. Irrigation confirmed hemostasis. Peritoneum was loosely reapproximated over the cuff and the denuded pedicles. Throughout the case we had inspected the retroperitoneum and identified the ureters and they were well out of the surgical field. Active peristalsis was noted with no change in ureteral peristalsis or diameter. Urine remained clear. Anterior peritoneum was then closed after removal or laparotomy equipment and correct sponge count. The fascia was closed using running 0 Vicryl. Subcutaneous Heaney tissues were reapproximated using interrupted 20 plain x5 sutures then subcuticular 4-0 Vicryl skin closure performed and Exparel injected x40 cc, 20 cc of a solution and 20 cc of saline Steri-Strips were applied and patient went to recovery in stable condition

## 2012-10-13 NOTE — Anesthesia Procedure Notes (Signed)
Procedure Name: Intubation Date/Time: 10/13/2012 7:50 AM Performed by: Despina Hidden Pre-anesthesia Checklist: Emergency Drugs available, Suction available, Patient being monitored and Patient identified Patient Re-evaluated:Patient Re-evaluated prior to inductionOxygen Delivery Method: Circle system utilized Preoxygenation: Pre-oxygenation with 100% oxygen Intubation Type: IV induction and Cricoid Pressure applied Ventilation: Mask ventilation without difficulty and Oral airway inserted - appropriate to patient size Laryngoscope Size: 3 and Mac Grade View: Grade I Tube type: Oral Tube size: 7.0 mm Number of attempts: 1 Airway Equipment and Method: Stylet Placement Confirmation: ETT inserted through vocal cords under direct vision,  positive ETCO2 and breath sounds checked- equal and bilateral Secured at: 21 cm Tube secured with: Tape Dental Injury: Teeth and Oropharynx as per pre-operative assessment

## 2012-10-13 NOTE — Transfer of Care (Signed)
Immediate Anesthesia Transfer of Care Note  Patient: Kathleen Key  Procedure(s) Performed: Procedure(s): HYSTERECTOMY ABDOMINAL (N/A) BILATERAL SALPINGECTOMY (Bilateral)  Patient Location: PACU  Anesthesia Type:General  Level of Consciousness: awake and patient cooperative  Airway & Oxygen Therapy: Patient Spontanous Breathing and Patient connected to face mask oxygen  Post-op Assessment: Report given to PACU RN, Post -op Vital signs reviewed and stable and Patient moving all extremities  Post vital signs: Reviewed and stable  Complications: No apparent anesthesia complications

## 2012-10-14 ENCOUNTER — Encounter (HOSPITAL_COMMUNITY): Payer: Self-pay | Admitting: Obstetrics and Gynecology

## 2012-10-14 LAB — CBC
HCT: 33 % — ABNORMAL LOW (ref 36.0–46.0)
Hemoglobin: 11.1 g/dL — ABNORMAL LOW (ref 12.0–15.0)
MCH: 32 pg (ref 26.0–34.0)
MCHC: 33.6 g/dL (ref 30.0–36.0)
MCV: 95.1 fL (ref 78.0–100.0)
Platelets: 265 10*3/uL (ref 150–400)
RBC: 3.47 MIL/uL — ABNORMAL LOW (ref 3.87–5.11)
RDW: 13.4 % (ref 11.5–15.5)
WBC: 8.3 10*3/uL (ref 4.0–10.5)

## 2012-10-14 LAB — BASIC METABOLIC PANEL
BUN: 6 mg/dL (ref 6–23)
CO2: 27 mEq/L (ref 19–32)
Calcium: 7.9 mg/dL — ABNORMAL LOW (ref 8.4–10.5)
Chloride: 106 mEq/L (ref 96–112)
Creatinine, Ser: 0.56 mg/dL (ref 0.50–1.10)
GFR calc Af Amer: >90 mL/min (ref >90)
GFR calc non Af Amer: >90 mL/min (ref >90)
Glucose, Bld: 115 mg/dL — ABNORMAL HIGH (ref 70–99)
Potassium: 3.8 mEq/L (ref 3.5–5.1)
Sodium: 137 mEq/L (ref 135–145)

## 2012-10-14 MED ORDER — PANTOPRAZOLE SODIUM 40 MG PO TBEC
40.0000 mg | DELAYED_RELEASE_TABLET | Freq: Every day | ORAL | Status: DC
  Administered 2012-10-14: 40 mg via ORAL
  Filled 2012-10-14: qty 1

## 2012-10-14 MED ORDER — METHYLPREDNISOLONE SODIUM SUCC 125 MG IJ SOLR
125.0000 mg | Freq: Once | INTRAMUSCULAR | Status: AC
  Administered 2012-10-14: 125 mg via INTRAVENOUS
  Filled 2012-10-14: qty 2

## 2012-10-14 MED ORDER — HYDROMORPHONE HCL PF 1 MG/ML IJ SOLN
1.0000 mg | INTRAMUSCULAR | Status: DC | PRN
  Administered 2012-10-14 – 2012-10-15 (×2): 1 mg via INTRAVENOUS
  Filled 2012-10-14 (×3): qty 1

## 2012-10-14 NOTE — Anesthesia Postprocedure Evaluation (Signed)
  Anesthesia Post-op Note  Patient: Kathleen Key  Procedure(s) Performed: Procedure(s): HYSTERECTOMY ABDOMINAL (N/A) BILATERAL SALPINGECTOMY (Bilateral)  Patient Location:Room 219  Anesthesia Type:General  Level of Consciousness: awake, alert , oriented and patient cooperative  Airway and Oxygen Therapy: Patient Spontanous Breathing  Post-op Pain: mild  Post-op Assessment: Post-op Vital signs reviewed, Patient's Cardiovascular Status Stable, Respiratory Function Stable, Patent Airway, No signs of Nausea or vomiting, Adequate PO intake and Pain level controlled  Post-op Vital Signs: Reviewed and stable  Complications: No apparent anesthesia complications

## 2012-10-14 NOTE — Progress Notes (Signed)
Patient complaining of itching.  No rash or problems breathing noted.  Benadryl and lotion given.  Will continue to monitor

## 2012-10-14 NOTE — Progress Notes (Signed)
Patient believes that the Toradol may be causing her itching.  She has a allergy to Excedrin and does not take Asprin due to her asthma.  AM dose of Toradol not given.  Will continue to monitor.

## 2012-10-14 NOTE — Progress Notes (Signed)
Ambulated in hallway without difficulty x's 1 assist. Patient says " I feel sore".Over all patient did very well. Family at bedside.

## 2012-10-14 NOTE — Progress Notes (Signed)
UR chart review completed.  

## 2012-10-14 NOTE — Plan of Care (Signed)
Problem: Phase I Progression Outcomes Goal: Other Phase I Outcomes/Goals Outcome: Adequate for Discharge Dressing change to surgical site, old bloody drainage noted steri strips intact,no redness noted.

## 2012-10-14 NOTE — Progress Notes (Signed)
The patient is receiving Protonix by the intravenous route.  Based on criteria approved by the Pharmacy and Therapeutics Committee and the Medical Executive Committee, the medication is being converted to the equivalent oral dose form.  These criteria include: -No Active GI bleeding -Able to tolerate diet of full liquids (or better) or tube feeding OR able to tolerate other medications by the oral or enteral route  If you have any questions about this conversion, please contact the Pharmacy Department (ext [phone]).  Thank you.  Kathleen Key, [facility] 10/14/2012 11:34 AM

## 2012-10-14 NOTE — Progress Notes (Signed)
1 Day Post-Op Procedure(s) (LRB): HYSTERECTOMY ABDOMINAL (N/A) BILATERAL SALPINGECTOMY (Bilateral)  Subjective: Patient reports minimal pain, c/o itching, not sure if PCA vs other meds. I suspect PCA .    Objective: I have reviewed patient's vital signs and labs. CBC    Component Value Date/Time   WBC 8.3 10/14/2012 0514   RBC 3.47* 10/14/2012 0514   HGB 11.1* 10/14/2012 0514   HCT 33.0* 10/14/2012 0514   PLT 265 10/14/2012 0514   MCV 95.1 10/14/2012 0514   MCH 32.0 10/14/2012 0514   MCHC 33.6 10/14/2012 0514   RDW 13.4 10/14/2012 0514      General: alert and pruritic GI: soft, non-tender; bowel sounds normal; no masses,  no organomegaly and incision: clean, dry, intact and dressing with light drainage, s/p local anesthetic.  Assessment: s/p Procedure(s): HYSTERECTOMY ABDOMINAL (N/A) BILATERAL SALPINGECTOMY (Bilateral): stable, progressing well and pruritis due to Dilaudid PCA likely  Plan: Advance diet Discontinue IV fluids d/c foley, add benadryl  LOS: 1 day    Kizzy Olafson V 10/14/2012, 11:00 AM

## 2012-10-15 ENCOUNTER — Other Ambulatory Visit: Payer: Self-pay | Admitting: *Deleted

## 2012-10-15 ENCOUNTER — Telehealth: Payer: Self-pay | Admitting: *Deleted

## 2012-10-15 DIAGNOSIS — T8140XA Infection following a procedure, unspecified, initial encounter: Secondary | ICD-10-CM

## 2012-10-15 MED ORDER — FLUCONAZOLE 150 MG PO TABS: 150.0000 mg | ORAL_TABLET | Freq: Once | ORAL | Status: DC

## 2012-10-15 NOTE — Progress Notes (Signed)
Patient discharged home today instructions were given on follow up visits,and discharge medications. Instructions given on Hysterectomy after care along with a handout,patient ,and family verbalized understanding. Vital signs stable.Patient to pick up  prescriptions from pharmacy of choice. Dressing changed to surgical site prior to discharge.Accompanied by staff to an awaiting vehicle.

## 2012-10-15 NOTE — Telephone Encounter (Signed)
Verified prescription received at [facility] per Dr. Emelda Fear verbal order.

## 2012-10-15 NOTE — Discharge Summary (Signed)
Gynecology Physician Discharge Summary  Patient ID: Kathleen Key MRN: [id number] DOB/AGE: 08/13/1965 47 y.o.  Admit date: 10/13/2012 Discharge date: 10/15/2012  Preoperative Diagnoses: Uterine fibroids 14 weeks acute uterine uterine retroflexion  Procedures: Procedure(s) (LRB): HYSTERECTOMY ABDOMINAL (N/A) BILATERAL SALPINGECTOMY (Bilateral)  Consults: None  Significant Diagnostic Studies: Preoperative hemoglobin   Recent Labs Lab 10/08/12 1430 10/14/12 0514  WBC 5.8 8.3  HGB 13.6 11.1*  HCT 40.3 33.0*  PLT 310 265     Hospital Course:  Kathleen Key is a 47 y.o. No obstetric history on file.  admitted for scheduled surgery.  She underwent the procedures as mentioned above, her operation was uncomplicated. For further details about surgery, please refer to the operative report. Patient had an uncomplicated postoperative course. By time of discharge, her pain was controlled on oral pain medications; she was ambulating, voiding without difficulty, tolerating regular diet and passing flatus. She was deemed stable for discharge to home.   pathology revealed benign fibroid tumors Disch arge Exam: Blood pressure 128/81, pulse 58, temperature 97.4 F (36.3 C), temperature source Oral, resp. rate 18, height 5\' 6"  (1.676 m), weight 123 lb (55.792 kg), last menstrual period 10/08/2012, SpO2 99.00%. General appearance: alert, cooperative and mild distress GI: soft, non-tender; bowel sounds normal; no masses,  no organomegaly  Discharged Condition: good  Disposition: 01-Home or Self Care  Discharge Orders   Future Appointments Provider Department Dept Phone   10/28/2012 11:00 AM Tilda Burrow, MD [facility] [phone]   Future Orders Complete By Expires     Call MD for:  severe uncontrolled pain  As directed     Call MD for:  temperature >100.4  As directed     Diet - low sodium heart healthy  As directed     Discharge instructions  As directed     Comments:      General Gynecological Post-Operative Instructions You may expect to feel dizzy, weak, and drowsy for as long as 24 hours after receiving the medicine that made you sleep (anesthetic). The following information pertains to your recovery period for the first 24 hours following surgery.  Do not drive a car, ride a bicycle, participate in physical activities, or take public transportation until you are done taking narcotic pain medicines or as directed by your caregiver.  Do not drink alcohol or take tranquilizers.  Do not take medicine that has not been prescribed by your caregiver.  Do not sign important papers or make important decisions while on narcotic pain medicines.  Have a responsible person with you.  CARE OF INCISION  Keep incision clean and dry. Take showers instead of baths until your caregiver gives you permission to take baths. Check with your caregiver if you have tubes coming from the wound site.  Avoid heavy lifting (more than 10 pounds/4.5 kilograms), pushing, or pulling x 2wk.  Avoid activities that may risk injury to your surgical site.  Only take over-the-counter or prescription medicines for pain, discomfort, or fever as directed by your caregiver. Do not take aspirin. It can make you bleed. Take medicines (antibiotics) that kill germs as directed.  Call the office or go to the MAU if:  You feel sick to your stomach (nauseous).  You start to throw up (vomit).  You have trouble eating or drinking.  You have an oral temperature above 100.4.  You have constipation that is not helped by adjusting diet or increasing fluid intake. Pain medicines are a common cause of constipation.  SEEK IMMEDIATE MEDICAL CARE IF:  You have persistent dizziness.  You have difficulty breathing or a congested sounding (croupy) cough.  You have an oral temperature above 102.5, not controlled by medicine.  There is increasing pain or tenderness near or in the surgical site.  ExitCare Patient  Information 2011 [place], [place].    Increase activity slowly  As directed     Leave dressing on - Keep it clean, dry, and intact until clinic visit  As directed     Scheduling Instructions:      May remove strips in one week if they are beginning to come lose. May shower with strips in place.    Sexual Activity Restrictions  As directed     Comments:      None x 6 wk        Medication List         docusate sodium 100 MG capsule  Commonly known as:  COLACE  Take 100 mg by mouth daily. 3 capsules once daily     fexofenadine 180 MG tablet  Commonly known as:  ALLEGRA  Take 180 mg by mouth as needed.     FIBER LAXATIVE PO  Take by mouth daily. 3 capsules once daily     ibuprofen 200 MG tablet  Commonly known as:  ADVIL,MOTRIN  Take 200 mg by mouth as needed for pain.     LORazepam 0.5 MG tablet  Commonly known as:  ATIVAN  Take 1 tablet (0.5 mg total) by mouth every 8 (eight) hours.     multivitamin with minerals Tabs  Take 1 tablet by mouth daily.     oxyCODONE-acetaminophen 5-325 MG per tablet  Commonly known as:  PERCOCET/ROXICET  Take 1 tablet by mouth every 4 (four) hours as needed for pain.     PROVENTIL HFA 108 (90 BASE) MCG/ACT inhaler  Generic drug:  albuterol  Inhale 2 puffs into the lungs every 6 (six) hours as needed for wheezing or shortness of breath.     SOLUBLE FIBER/PROBIOTICS PO  Take 1 tablet by mouth daily.         Signed:  Burnard Bunting

## 2012-10-15 NOTE — Care Management Note (Signed)
    Page 1 of 1   10/15/2012     2:30:18 PM   CARE MANAGEMENT NOTE 10/15/2012  Patient:  Kathleen Key, Kathleen Key   Account Number:  [id number]  Date Initiated:  10/15/2012  Documentation initiated by:  Sharrie Rothman  Subjective/Objective Assessment:   Pt admitted from home s/p abd hysterectomy. Pt lives with her husband and will return home at discharge. Pt has family that will be staying with her for a few days at discharge while husband is working.     Action/Plan:   No CM needs noted. Pt for discharge today.   Anticipated DC Date:  10/15/2012   Anticipated DC Plan:  HOME/SELF CARE      DC Planning Services  CM consult      Choice offered to / List presented to:             Status of service:  Completed, signed off Medicare Important Message given?   (If response is "NO", the following Medicare IM given date fields will be blank) Date Medicare IM given:   Date Additional Medicare IM given:    Discharge Disposition:  HOME/SELF CARE  Per UR Regulation:    If discussed at Long Length of Stay Meetings, dates discussed:    Comments:  10/15/12 1055 Arlyss Queen, RN BSN CM

## 2012-10-16 ENCOUNTER — Telehealth: Payer: Self-pay | Admitting: Obstetrics and Gynecology

## 2012-10-16 NOTE — Telephone Encounter (Signed)
Spoke with pt. Had abd hyst on Tuesday. On 2 different pain meds, Vicodin and Toradol. Pt wanted to know if she could take Ibuprofen in between. Dr. Despina Hidden said no, that would be doubling up on Ibuprofen. Incision not draining. Can she take bandage off and leave off. Dr. Despina Hidden said yes. Pt voiced understanding. JSY

## 2012-10-17 ENCOUNTER — Encounter (HOSPITAL_COMMUNITY): Payer: Self-pay | Admitting: *Deleted

## 2012-10-17 ENCOUNTER — Emergency Department (HOSPITAL_COMMUNITY)
Admission: EM | Admit: 2012-10-17 | Discharge: 2012-10-17 | Disposition: A | Discharge status: Home / Self Care | Payer: 59 | Attending: Emergency Medicine | Admitting: Emergency Medicine

## 2012-10-17 DIAGNOSIS — Z9079 Acquired absence of other genital organ(s): Secondary | ICD-10-CM | POA: Insufficient documentation

## 2012-10-17 DIAGNOSIS — Z90710 Acquired absence of both cervix and uterus: Secondary | ICD-10-CM | POA: Insufficient documentation

## 2012-10-17 DIAGNOSIS — Z8742 Personal history of other diseases of the female genital tract: Secondary | ICD-10-CM | POA: Insufficient documentation

## 2012-10-17 DIAGNOSIS — R109 Unspecified abdominal pain: Secondary | ICD-10-CM

## 2012-10-17 DIAGNOSIS — R112 Nausea with vomiting, unspecified: Secondary | ICD-10-CM | POA: Insufficient documentation

## 2012-10-17 DIAGNOSIS — G8918 Other acute postprocedural pain: Secondary | ICD-10-CM | POA: Insufficient documentation

## 2012-10-17 DIAGNOSIS — Z8719 Personal history of other diseases of the digestive system: Secondary | ICD-10-CM | POA: Insufficient documentation

## 2012-10-17 DIAGNOSIS — R1084 Generalized abdominal pain: Secondary | ICD-10-CM | POA: Insufficient documentation

## 2012-10-17 DIAGNOSIS — K5900 Constipation, unspecified: Secondary | ICD-10-CM | POA: Insufficient documentation

## 2012-10-17 DIAGNOSIS — Z79899 Other long term (current) drug therapy: Secondary | ICD-10-CM | POA: Insufficient documentation

## 2012-10-17 DIAGNOSIS — J45909 Unspecified asthma, uncomplicated: Secondary | ICD-10-CM | POA: Insufficient documentation

## 2012-10-17 MED ORDER — POLYETHYLENE GLYCOL 3350 17 G PO PACK: 17.0000 g | PACK | Freq: Three times a day (TID) | ORAL | Status: DC | PRN

## 2012-10-17 MED ORDER — FLEET ENEMA 7-19 GM/118ML RE ENEM: 1.0000 | ENEMA | RECTAL | Status: DC | PRN

## 2012-10-17 NOTE — ED Notes (Signed)
Pt states she is having lower abdominal pain and 1 episode of vomiting and hasn't had a BM since Tuesday. Pt had a hysterectomy on Tuesday. Pt is concerned because she vomited on her stitches while sitting on the toilet.

## 2012-10-17 NOTE — ED Notes (Signed)
Initial assessment with patient is consistent with triage notes. Pt states she has a HX of constipation and the pain is similar, pt is worried about the integrity of the hysterectomy incision from the surgery on the 15th. No BM since 15th.

## 2012-10-17 NOTE — ED Notes (Signed)
Pt alert & oriented x4, stable gait. Patient given discharge instructions, paperwork & prescription(s). Patient  instructed to stop at the registration desk to finish any additional paperwork. Patient verbalized understanding. Pt left department w/ no further questions. 

## 2012-10-17 NOTE — ED Provider Notes (Signed)
History  This chart was scribed for Raeford Razor, MD, by Candelaria Stagers, ED Scribe. This patient was seen in room APA18/AP[facility]18 and the patient's care was started at 7:44 PM  CSN: [id number] Arrival date & time 10/17/12  [name]  First MD Initiated Contact with Patient 10/17/12 1908     Chief Complaint  Patient presents with  . Abdominal Pain    The history is provided by the patient. No language interpreter was used.   HPI Comments: Kathleen Key is a 47 y.o. female who presents to the Emergency Department complaining of unchanged abdominal pain following a hysterectomy that was done five days ago.  Pt was released from the hospital three days ago.  She reports constipation since the surgery as well.  She is also experiencing nausea and vomiting.  Pt denies vaginal bleeding or dysuria.  Pt has taken oxycodone with some relief of pain.  She has taken colace, miralax, probiotic with no relief of constipation.   Past Medical History  Diagnosis Date  . Asthma   . Fibroids     uterine  . IBS (irritable bowel syndrome)    Past Surgical History  Procedure Laterality Date  . Wisdom tooth extraction      Dr. Keturah Barre office-[facility], [place], [place]  . Abdominal hysterectomy N/A 10/13/2012    Procedure: HYSTERECTOMY ABDOMINAL;  Surgeon: Tilda Burrow, MD;  Location: [facility];  Service: Gynecology;  Laterality: N/A;  . Bilateral salpingectomy Bilateral 10/13/2012    Procedure: BILATERAL SALPINGECTOMY;  Surgeon: Tilda Burrow, MD;  Location: [facility];  Service: Gynecology;  Laterality: Bilateral;   Family History  Problem Relation Age of Onset  . Heart disease Mother     leaky valve  . Hypertension Father     pacemaker  . Fibromyalgia Sister   . Neuropathy Sister    History  Substance Use Topics  . Smoking status: Never Smoker   . Smokeless tobacco: Not on file  . Alcohol Use: Yes     Comment: occassional   OB History   Grav Para Term Preterm Abortions TAB SAB Ect  Mult Living                 Review of Systems  Constitutional: Negative for fever.  Gastrointestinal: Positive for nausea, vomiting, abdominal pain and constipation.  All other systems reviewed and are negative.    Allergies  Dilaudid; Excedrin back &; and Percocet  Home Medications   Current Outpatient Rx  Name  Route  Sig  Dispense  Refill  . albuterol (PROVENTIL HFA) 108 (90 BASE) MCG/ACT inhaler   Inhalation   Inhale 2 puffs into the lungs every 6 (six) hours as needed for wheezing or shortness of breath.         . Calcium Polycarbophil (FIBER LAXATIVE PO)   Oral   Take by mouth daily. 3 capsules once daily         . docusate sodium (COLACE) 100 MG capsule   Oral   Take 100 mg by mouth daily. 3 capsules once daily         . fexofenadine (ALLEGRA) 180 MG tablet   Oral   Take 180 mg by mouth as needed.         . fluconazole (DIFLUCAN) 150 MG tablet   Oral   Take 1 tablet (150 mg total) by mouth once.   2 tablet   0     Take 1 tablet now and one in 3 days.   [name]  ibuprofen (ADVIL,MOTRIN) 200 MG tablet   Oral   Take 200 mg by mouth as needed for pain.         [name] LORazepam (ATIVAN) 0.5 MG tablet   Oral   Take 1 tablet (0.5 mg total) by mouth every 8 (eight) hours.   20 tablet   0   . Multiple Vitamin (MULTIVITAMIN WITH MINERALS) TABS   Oral   Take 1 tablet by mouth daily.         [name] oxyCODONE-acetaminophen (PERCOCET/ROXICET) 5-325 MG per tablet   Oral   Take 1 tablet by mouth every 4 (four) hours as needed for pain.         . Probiotic Product (SOLUBLE FIBER/PROBIOTICS PO)   Oral   Take 1 tablet by mouth daily.          BP 125/74  Pulse 64  Temp(Src) 97.6 F (36.4 C) (Oral)  Resp 17  Ht 5\' 6"  (1.676 m)  Wt 123 lb (55.792 kg)  BMI 19.86 kg/m2  SpO2 100%  LMP 10/08/2012 Physical Exam  Nursing note and vitals reviewed. Constitutional: She is oriented to person, place, and time. She appears well-developed and well-nourished. No  distress.  HENT:  Head: Normocephalic and atraumatic.  Eyes: EOM are normal.  Neck: Neck supple. No tracheal deviation present.  Cardiovascular: Normal rate.   Pulmonary/Chest: Effort normal. No respiratory distress.  Abdominal: She exhibits no distension. There is tenderness (diffuse tenderness appropriate for surgery).  Sutures clean dry and intact.   Musculoskeletal: Normal range of motion.  Neurological: She is alert and oriented to person, place, and time.  Skin: Skin is warm and dry.  Psychiatric: She has a normal mood and affect. Her behavior is normal.    ED Course  Procedures   DIAGNOSTIC STUDIES: Oxygen Saturation is 100% on room air, normal by my interpretation.    COORDINATION OF CARE:  7:50 PM Discussed course of care with pt which includes continued use of miralax and percocet.  Pt understands and agrees.   Labs Reviewed - No data to display No results found. 1. Abdominal pain   2. Constipated     MDM  47 year old female with pain and constipation after recent surgery. Constipation is likely multifactorial because of the surgery itself, medication use and decreased mobility/ambulation. Patient's abdominal/incisional tenderness is appropriate given the timing of her recent surgery. No concerning skin changes noted. Discussed bowel regimen. Return precautions discussed. Followup with her Dr Emelda Fear otherwise.  I personally preformed the services scribed in my presence. The recorded information has been reviewed is accurate. Raeford Razor, MD.    Raeford Razor, MD 10/22/12 2151

## 2012-10-19 ENCOUNTER — Telehealth: Payer: Self-pay | Admitting: *Deleted

## 2012-10-19 MED ORDER — METOCLOPRAMIDE HCL 10 MG PO TABS: 10.0000 mg | ORAL_TABLET | Freq: Three times a day (TID) | ORAL | Status: DC

## 2012-10-19 NOTE — Telephone Encounter (Signed)
Pt calling requesting something for nausea, states had 2 BM yesterday and one this am. Per Dr. Emelda Fear will call in Reglan for nausea, if pt has vomiting to call office to be seen. Pt verbalized understanding.

## 2012-10-20 ENCOUNTER — Ambulatory Visit (INDEPENDENT_AMBULATORY_CARE_PROVIDER_SITE_OTHER): Payer: 59 | Admitting: Obstetrics and Gynecology

## 2012-10-20 ENCOUNTER — Telehealth: Payer: Self-pay | Admitting: Obstetrics and Gynecology

## 2012-10-20 ENCOUNTER — Encounter: Payer: Self-pay | Admitting: Obstetrics and Gynecology

## 2012-10-20 VITALS — BP 130/80 | HR 72 | Temp 97.5°F | Resp 32 | Wt 123.0 lb

## 2012-10-20 DIAGNOSIS — T814XXA Infection following a procedure, initial encounter: Secondary | ICD-10-CM

## 2012-10-20 DIAGNOSIS — R339 Retention of urine, unspecified: Secondary | ICD-10-CM

## 2012-10-20 DIAGNOSIS — Z9889 Other specified postprocedural states: Secondary | ICD-10-CM

## 2012-10-20 DIAGNOSIS — G8918 Other acute postprocedural pain: Secondary | ICD-10-CM

## 2012-10-20 MED ORDER — CIPROFLOXACIN HCL 500 MG PO TABS: 500.0000 mg | ORAL_TABLET | Freq: Two times a day (BID) | ORAL | Status: DC

## 2012-10-20 NOTE — Telephone Encounter (Signed)
Pt had hysterectomy abdominal on 10/13/2012, c/o yellowish discharge from incision site and in "a lot of pain." Pt told to be here at 1:30 to see Dr. Despina Hidden. Dr. Emelda Fear in surgery. Pt verbalized understanding.

## 2012-10-20 NOTE — Patient Instructions (Addendum)
Restart the Toradol Begin the Cipro for possible subclinical infection  F/u 3 days.

## 2012-10-20 NOTE — Progress Notes (Signed)
Patient ID: Kathleen Key, female   DOB: Apr 26, 1965, 47 y.o.   MRN: [id number] Pt work in today due to N/V, dizziness, "alot of abdominal pain." Pt states noticed yellowish drainage from incision site.  Pt had a Abdominal Hysterectomy 10/13/12.

## 2012-10-20 NOTE — Progress Notes (Addendum)
Subjective:     Kathleen Key is a 47 y.o. female who presents to the clinic 1 weeks status post TAH , bilateral salpingectomy for fibroids, 569 gm uterus, benign, and benign salpingectomy bilateral. Diet:       regular with difficulty. Bowel function is: abnormal with patient seen in ED at [facility] on saturday, 4 days postop, for constipation, where she was added Miralax on top of Colace she  was already taking. Pt became nauseated after Miralax, . Pain:     Pt trying to avoid analgesics, and is significantly uncomfortable.  She has had BM x 2 today, and is concerned over the degree of pain, particularly on the patients right side, and the small amount of drainage from the right edge of the incision.  She denies fevers at home. She has not been taking her Toradol for pain due to a fear it might make the constipation worse. Pt is to restart the PO toradol asap.  Review of Systems Pertinent items are noted in HPI.    Objective:    BP 130/80  Wt 123 lb (55.792 kg)  BMI 19.86 kg/m2  LMP 10/08/2012 General:  alert, cooperative, appears stated age, distracted and moderate distress  Abdomen: soft, bowel sounds active, no abnormal masses,   Incision:   healing well, well approximated, no dehiscence, incision well approximated with a 4 mm area at the right edge of the incision that is slightly erythematous, like a partial separation , but no expressible purulence       Pelvic:  Light amount of discharge, slight purulence seemingly wnl for 7 day postop.    Cuff is inctact. Moderately tender, without any swelling to suggest abscess..    Assessment:    Postoperative course complicated by nausea , inadequate pain control without use of toradol, mild constipation over weekend., now resolved. Operative findings again reviewed. Pathology report discussed.    Plan:    1. Continue any current medications. 2. Wound care discussed. 3. Activity restrictions: no climbing running, no driving and no  lifting more than 10 pounds 4. Anticipated return to work: not applicable. 5. Follow up: 3 days for  Recheck, of incision 6. Will check cbc, esr, bmp 7 will restart toradol 8. Will start Cipro 500 bid to r/o supclinical infection..   Labs reviewed with patient. Pt has BM this am in response to dulcolax and feels somewhat improved. Will continue Cipro , though risk of infection seems low. CBC    Component Value Date/Time   WBC 5.4 10/20/2012 1410   RBC 4.41 10/20/2012 1410   HGB 13.6 10/20/2012 1410   HCT 41.8 10/20/2012 1410   PLT 358 10/20/2012 1410   MCV 94.8 10/20/2012 1410   MCH 30.8 10/20/2012 1410   MCHC 32.5 10/20/2012 1410   RDW 13.5 10/20/2012 1410   LYMPHSABS 1.3 10/20/2012 1410   MONOABS 0.6 10/20/2012 1410   EOSABS 0.1 10/20/2012 1410   BASOSABS 0.1 10/20/2012 1410

## 2012-10-21 LAB — BASIC METABOLIC PANEL WITH GFR
Chloride: 100 mEq/L (ref 96–112)
Creat: 0.61 mg/dL (ref 0.50–1.10)
GFR, Est African American: >89 mL/min
GFR, Est Non African American: >89 mL/min

## 2012-10-21 LAB — CBC WITH DIFFERENTIAL/PLATELET
Basophils Absolute: 0.1 10*3/uL (ref 0.0–0.1)
Eosinophils Absolute: 0.1 10*3/uL (ref 0.0–0.7)
Lymphocytes Relative: 24 % (ref 12–46)
Lymphs Abs: 1.3 10*3/uL (ref 0.7–4.0)
MCH: 30.8 pg (ref 26.0–34.0)
Neutrophils Relative %: 63 % (ref 43–77)
Platelets: 358 10*3/uL (ref 150–400)
RBC: 4.41 MIL/uL (ref 3.87–5.11)
WBC: 5.4 10*3/uL (ref 4.0–10.5)

## 2012-10-21 LAB — SEDIMENTATION RATE: Sed Rate: 1 mm/hr (ref 0–22)

## 2012-10-23 ENCOUNTER — Other Ambulatory Visit: Payer: Self-pay | Admitting: Obstetrics and Gynecology

## 2012-10-23 ENCOUNTER — Ambulatory Visit: Payer: 59 | Admitting: Obstetrics and Gynecology

## 2012-10-26 ENCOUNTER — Telehealth: Payer: Self-pay | Admitting: *Deleted

## 2012-10-26 NOTE — Telephone Encounter (Signed)
Pt states "feeling much better" continues to take hydrocodone and tramadol for pain. Requesting something for yeast infection.

## 2012-10-27 NOTE — Telephone Encounter (Signed)
She'll call after hours nurse last night and has a problem taken care of by oral Diflucan per hour nurse protocol

## 2012-10-28 ENCOUNTER — Ambulatory Visit (INDEPENDENT_AMBULATORY_CARE_PROVIDER_SITE_OTHER): Payer: 59 | Admitting: Obstetrics and Gynecology

## 2012-10-28 ENCOUNTER — Telehealth: Payer: Self-pay | Admitting: Obstetrics and Gynecology

## 2012-10-28 ENCOUNTER — Encounter: Payer: Self-pay | Admitting: Obstetrics and Gynecology

## 2012-10-28 VITALS — BP 104/60 | Ht 66.0 in | Wt 116.0 lb

## 2012-10-28 DIAGNOSIS — Z09 Encounter for follow-up examination after completed treatment for conditions other than malignant neoplasm: Secondary | ICD-10-CM

## 2012-10-28 NOTE — Progress Notes (Signed)
Subjective:     Kathleen Key is a 47 y.o. female who presents to the clinic 2 weeks status post total abdominal hysterectomy for fibroids. Diet:       regular without difficulty. Bowel function is: normal. Pain:     Pain is controlled with current analgesics. Medications being used: prescription NSAID's including hydrocodone 1/2 pill and narcotic analgesics including .[name]    Review of Systems     Objective:    BP 104/60  Ht 5\' 6"  (1.676 m)  Wt 116 lb (52.617 kg)  BMI 18.73 kg/m2  LMP 10/08/2012 General:  alert and cooperative  Abdomen: soft, bowel sounds active, non-tender  Incision:   healing well, no drainage, no erythema, no hernia, no seroma, no swelling, no dehiscence, incision well approximated       Pelvic: defer    Assessment:    Doing well postoperatively. Operative findings again reviewed. Pathology report discussed.    Plan:    1. Continue any current medications. 2. Wound care discussed. 3. Activity restrictions: rtw at Aug 13 at 4 wk 4. Anticipated return to work: 2-3 weeks. 5. Follow up: 4 week for  Final chk.

## 2012-10-28 NOTE — Patient Instructions (Signed)
May RTW Aug 13

## 2012-10-30 ENCOUNTER — Telehealth: Payer: Self-pay | Admitting: Obstetrics and Gynecology

## 2012-11-04 ENCOUNTER — Encounter: Payer: Self-pay | Admitting: Obstetrics and Gynecology

## 2012-11-25 ENCOUNTER — Ambulatory Visit (INDEPENDENT_AMBULATORY_CARE_PROVIDER_SITE_OTHER): Payer: 59 | Admitting: Obstetrics and Gynecology

## 2012-11-25 ENCOUNTER — Encounter: Payer: Self-pay | Admitting: Obstetrics and Gynecology

## 2012-11-25 VITALS — BP 110/70 | Ht 66.0 in | Wt 121.0 lb

## 2012-11-25 DIAGNOSIS — Z9889 Other specified postprocedural states: Secondary | ICD-10-CM

## 2012-11-25 NOTE — Patient Instructions (Signed)
Thank you for letting us serve.

## 2012-11-25 NOTE — Progress Notes (Signed)
Subjective:     Kathleen Key is a 47 y.o. female who presents to the clinic 6 weeks status post total abdominal hysterectomy for fibroids. Diet:       regular without difficulty. Bowel function is: requires benefiber. Pain:     Pain is controlled with current analgesics. Medications being used: ibuprofen (OTC).    Review of Systems Pertinent items are noted in HPI.    Objective:    BP 110/70  Ht 5\' 6"  (1.676 m)  Wt 121 lb (54.885 kg)  BMI 19.54 kg/m2  LMP 10/08/2012 General:  alert, cooperative and no distress  Abdomen: soft, bowel sounds active, non-tender  Incision:   healing well, no drainage, no erythema, no hernia, no seroma, no swelling, no dehiscence, incision well approximated       Pelvic: cuff granulation 2 mm excised from cuff under topical spray anesth.    Assessment:    Doing well postoperatively. Operative findings again reviewed. Pathology report discussed.    Plan:    1. Continue any current medications. 2. Wound care discussed. 3. Activity restrictions: full activities , including sex,(cautiously) 4. Anticipated return to work: not applicable. 5. Follow up: prn year. for  probs.

## 2012-12-02 ENCOUNTER — Other Ambulatory Visit: Payer: Self-pay | Admitting: Obstetrics and Gynecology

## 2012-12-02 ENCOUNTER — Telehealth: Payer: Self-pay | Admitting: Obstetrics and Gynecology

## 2012-12-02 ENCOUNTER — Ambulatory Visit (INDEPENDENT_AMBULATORY_CARE_PROVIDER_SITE_OTHER): Payer: 59 | Admitting: Internal Medicine

## 2012-12-02 ENCOUNTER — Encounter: Payer: Self-pay | Admitting: Internal Medicine

## 2012-12-02 VITALS — BP 114/76 | HR 87 | Ht 66.0 in | Wt 121.0 lb

## 2012-12-02 DIAGNOSIS — R918 Other nonspecific abnormal finding of lung field: Secondary | ICD-10-CM

## 2012-12-02 NOTE — Progress Notes (Signed)
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 Hosp this summer for hysterectomy/ fibroids. CT abd/pelvis noted multiple small L lung nodules. No TB exposure and PPD always negative. No prior history of lung nodules. Father had smoked. Some history of asthma and bronchitis without pneumonia. No routine cough, phlegm, night sweats, swollen glands. CT abd/pelvis 09/29/12 IMPRESSION:  1. Significantly enlarged, fibroid uterus compressing the right  ureter, causing minimal right hydronephrosis.  2. 2.2 cm right ovarian dermoid and adjacent 3.2 cm cyst.  3. Multiple small, noncalcified nodules in the left lower lobe. If  the patient is at high risk for bronchogenic carcinoma, follow-up  chest CT at 6-12 months is recommended. If the patient is at low  risk for bronchogenic carcinoma, follow-up chest CT at 12 months is  recommended. This recommendation follows the consensus statement:  Guidelines for Management of Small Pulmonary Nodules Detected on CT  Scans: A Statement from the [employer] as published in  Radiology 2005; 237:395-400.  Original Report Authenticated By: Beckie Salts, M.D.  Prior to Admission medications   Medication Sig Start Date End Date Taking? Authorizing Provider  Calcium Polycarbophil (FIBER LAXATIVE PO) Take by mouth daily. 3 capsules once daily   Yes Historical Provider, MD  docusate sodium (COLACE) 100 MG capsule Take 200 mg by mouth daily.    Yes Historical Provider, MD  fexofenadine-pseudoephedrine (ALLEGRA-D) 60-120 MG per tablet Take 1 tablet by mouth 2 (two) times daily.   Yes Historical Provider, MD  ibuprofen (ADVIL,MOTRIN) 200 MG tablet Take 200 mg by mouth as needed for pain.   Yes Historical Provider, MD  Magnesium 500 MG CAPS Take 1 capsule by mouth 2 (two) times daily.   Yes Historical Provider, MD  Multiple Vitamin (MULTIVITAMIN WITH MINERALS) TABS Take 1 tablet by mouth daily.   Yes Historical Provider, MD   Probiotic Product (SOLUBLE FIBER/PROBIOTICS PO) Take 1 tablet by mouth daily.   Yes Historical Provider, MD  albuterol (PROVENTIL HFA) 108 (90 BASE) MCG/ACT inhaler Inhale 2 puffs into the lungs every 6 (six) hours as needed for wheezing or shortness of breath.    Historical Provider, MD  HYDROcodone-acetaminophen (NORCO) 7.5-325 MG per tablet Take 1 tablet by mouth every 6 (six) hours as needed for pain.    Historical Provider, MD  LORazepam (ATIVAN) 0.5 MG tablet TAKE 1 TABLET EVERY 8 HOURS 12/02/12   Tilda Burrow, MD  metoCLOPramide (REGLAN) 10 MG tablet Take 1 tablet (10 mg total) by mouth 3 (three) times daily before meals. 10/19/12   Tilda Burrow, MD  polyethylene glycol [facility] / Ethelene Hal) packet Take 17 g by mouth 3 (three) times daily as needed. 10/17/12   Raeford Razor, MD   Past Medical History  Diagnosis Date  . Asthma   . Fibroids     uterine  . IBS (irritable bowel syndrome)    Past Surgical History  Procedure Laterality Date  . Wisdom tooth extraction      Dr. Keturah Barre office-[facility], [place], [place]  . Abdominal hysterectomy N/A 10/13/2012    Procedure: HYSTERECTOMY ABDOMINAL;  Surgeon: Tilda Burrow, MD;  Location: [facility];  Service: Gynecology;  Laterality: N/A;  . Bilateral salpingectomy Bilateral 10/13/2012    Procedure: BILATERAL SALPINGECTOMY;  Surgeon: Tilda Burrow, MD;  Location: [facility];  Service: Gynecology;  Laterality: Bilateral;   Family History  Problem Relation Age of Onset  . Heart disease Mother     leaky valve  .  Hypertension Father     pacemaker  . Fibromyalgia Sister   . Neuropathy Sister    History   Social History  . Marital Status: Married    Spouse Name: N/A    Number of Children: N/A  . Years of Education: N/A   Occupational History  . Not on file.   Social History Main Topics  . Smoking status: Never Smoker   . Smokeless tobacco: Never Used  . Alcohol Use: Yes     Comment: occassional  . Drug Use: No  .  Sexual Activity: Not Currently    Birth Control/ Protection: Surgical   Other Topics Concern  . Not on file   Social History Narrative  . No narrative on file   ROS-see HPI Constitutional:   No-   weight loss, night sweats, fevers, chills, fatigue, lassitude. HEENT:   + headaches, difficulty swallowing, tooth/dental problems, sore throat,      + sneezing, itching, ear ache, nasal congestion, post nasal drip,  CV:  No-   chest pain, orthopnea, PND, swelling in lower extremities, anasarca, dizziness, palpitations Resp: + shortness of breath with exertion or at rest.              No-   productive cough,  + non-productive cough,  No- coughing up of blood.              No-   change in color of mucus.  No- wheezing.   Skin: No-   rash or lesions. GI:  No-   heartburn, indigestion, abdominal pain, nausea, vomiting, diarrhea,                 change in bowel habits, loss of appetite GU: No-   dysuria, change in color of urine, no urgency or frequency.  No- flank pain. MS:  No-   joint pain or swelling.  No- decreased range of motion.  No- back pain. Neuro-     nothing unusual Psych:  No- change in mood or affect. No depression + anxiety.  No memory loss.  OBJ- Physical Exam General- Alert, Oriented, Affect-appropriate, Distress- +crying-father had CVA 2 days ago Skin- rash-none, lesions- none, excoriation- none Lymphadenopathy- none Head- atraumatic            Eyes- Gross vision intact, PERRLA, conjunctivae and secretions clear            Ears- Hearing, canals-normal            Nose- Clear, no-Septal dev, mucus, polyps, erosion, perforation             Throat- Mallampati II , mucosa clear , drainage- none, tonsils- atrophic Neck- flexible , trachea midline, no stridor , thyroid nl, carotid no bruit Chest - symmetrical excursion , unlabored           Heart/CV- RRR , no murmur , no gallop  , no rub, nl s1 s2                           - JVD- none , edema- none, stasis changes- none, varices-  none           Lung- clear to P&A, wheeze- none, cough- none , dullness-none, rub- none           Chest wall-  Abd- tender-no, distended-no, bowel sounds-present, HSM- no Br/ Gen/ Rectal- Not done, not indicated Extrem- cyanosis- none, clubbing, none, atrophy- none, strength- nl Neuro- grossly intact to observation

## 2012-12-02 NOTE — Telephone Encounter (Signed)
Left message x 1. JSY 

## 2012-12-02 NOTE — Telephone Encounter (Signed)
Spoke with pt. Pt is requesting Lorazepam. Pt states you have prescribed this before for her. Please send to [facility]. Thanks!!!!

## 2012-12-02 NOTE — Patient Instructions (Addendum)
I think these are benign lung nodules, but we will want to follow them.   Plan- CXR in 3 months. See me with f/u non-contrast Chest CT in 6 months We will order these for you

## 2012-12-09 ENCOUNTER — Encounter: Payer: Self-pay | Admitting: Internal Medicine

## 2012-12-09 DIAGNOSIS — D869 Sarcoidosis, unspecified: Secondary | ICD-10-CM | POA: Insufficient documentation

## 2012-12-09 NOTE — Assessment & Plan Note (Signed)
Nonspecific small nodules are likely old granulomas.  Plan-chest x-ray 3 months, CT chest in 6 months

## 2013-05-28 ENCOUNTER — Telehealth: Payer: Self-pay | Admitting: Internal Medicine

## 2013-05-28 NOTE — Telephone Encounter (Signed)
Spoke with Jakeline. She reports she had message from [facility] that they need medical records from [place]. I called rose from CT and she is not showing they need anything from [place]. Pt is aware. Nothing further needed

## 2013-06-01 ENCOUNTER — Ambulatory Visit (INDEPENDENT_AMBULATORY_CARE_PROVIDER_SITE_OTHER)
Admission: RE | Admit: 2013-06-01 | Discharge: 2013-06-01 | Disposition: A | Discharge status: Home / Self Care | Payer: 59 | Source: Ambulatory Visit | Attending: Internal Medicine | Admitting: Internal Medicine

## 2013-06-01 ENCOUNTER — Telehealth: Payer: Self-pay | Admitting: Internal Medicine

## 2013-06-01 ENCOUNTER — Other Ambulatory Visit: Payer: Self-pay | Admitting: Internal Medicine

## 2013-06-01 DIAGNOSIS — R918 Other nonspecific abnormal finding of lung field: Secondary | ICD-10-CM

## 2013-06-01 NOTE — Telephone Encounter (Signed)
Landi asked I review her CT chest today, done for f/u of lung nodules noted at CT abd in Sept, 2014. Bilateral small nodules are seen, and note of a RUL infiltrate. She denies acute illness, fever, purulent. Imp - some of nodules appear to have central calcification, indicating chronic stability. There are probably nonspecific granulomas. Plan- lab for ACE level and Quant T B gold.

## 2013-06-02 ENCOUNTER — Other Ambulatory Visit: Payer: 59

## 2013-06-02 DIAGNOSIS — R918 Other nonspecific abnormal finding of lung field: Secondary | ICD-10-CM

## 2013-06-03 LAB — ANGIOTENSIN CONVERTING ENZYME: Angiotensin-Converting Enzyme: 76 U/L — ABNORMAL HIGH (ref 8–52)

## 2013-06-04 ENCOUNTER — Other Ambulatory Visit: Payer: Self-pay | Admitting: Internal Medicine

## 2013-06-04 DIAGNOSIS — R918 Other nonspecific abnormal finding of lung field: Secondary | ICD-10-CM

## 2013-06-07 LAB — QUANTIFERON TB GOLD ASSAY (BLOOD)
INTERFERON GAMMA RELEASE ASSAY: NEGATIVE
QUANTIFERON TB AG MINUS NIL: 0 [IU]/mL
Quantiferon Nil Value: 0.03 IU/mL
TB AG VALUE: 0.02 [IU]/mL

## 2013-06-08 ENCOUNTER — Telehealth: Payer: Self-pay | Admitting: Internal Medicine

## 2013-06-08 NOTE — Telephone Encounter (Signed)
Kathleen Key had CT abdomen 11/2012, showing incidental lung nodules in lower zones. F/U CT chest done 05/2012 was reviewed with her, showing scattered lung nodules, some with central calcification. She is asymptomatic- specifically w/o weight loss, routine cough, enlarged nodes, fever. Labs now available indicate Quantiferon TB Gold assay negative for TB exposure, ACE level elevated 76, consistent with sarcoid.  Imp- Asymptomatic sarcoid with lung nodules.  Plan- Follow at long intervals. She is comfortable with a conservative approach.

## 2013-11-29 ENCOUNTER — Ambulatory Visit (INDEPENDENT_AMBULATORY_CARE_PROVIDER_SITE_OTHER): Payer: 59 | Admitting: Obstetrics and Gynecology

## 2013-11-29 ENCOUNTER — Encounter: Payer: Self-pay | Admitting: Obstetrics and Gynecology

## 2013-11-29 VITALS — BP 118/78 | Ht 66.0 in | Wt 125.6 lb

## 2013-11-29 DIAGNOSIS — Z01419 Encounter for gynecological examination (general) (routine) without abnormal findings: Secondary | ICD-10-CM | POA: Insufficient documentation

## 2013-11-29 NOTE — Patient Instructions (Addendum)
Lubricants:  ° °[place]  °Feminease  °Gynemoistrin  ° °

## 2013-11-29 NOTE — Progress Notes (Signed)
This chart was scribed by Ludger Nutting, Medical Scribe, for Dr. Mallory Shirk on 11/29/13 at 9:11 AM. This chart was reviewed by Dr. Mallory Shirk for accuracy.  Assessment:  Annual Gyn Exam  s/p hyst with removal of cervix Plan:  1. return q 2 yr. Til 50 for gyn check 2.    Annual mammogram advised Subjective:  Kathleen Key is a 48 y.o. female No obstetric history on file. who presents for annual exam. Patient's last menstrual period was 10/08/2012. The patient has no complaints today. She denies family history of breast, cervical, or ovarian cancer.   The following portions of the patient's history were reviewed and updated as appropriate: allergies, current medications, past family history, past medical history, past social history, past surgical history and problem list. Past Medical History  Diagnosis Date  . Asthma   . Fibroids     uterine  . IBS (irritable bowel syndrome)     Past Surgical History  Procedure Laterality Date  . Wisdom tooth extraction      Dr. Georgina Snell office-[facility], [place], [place]  . Abdominal hysterectomy N/A 10/13/2012    Procedure: HYSTERECTOMY ABDOMINAL;  Surgeon: Jonnie Kind, MD;  Location: [facility];  Service: Gynecology;  Laterality: N/A;  . Bilateral salpingectomy Bilateral 10/13/2012    Procedure: BILATERAL SALPINGECTOMY;  Surgeon: Jonnie Kind, MD;  Location: [facility];  Service: Gynecology;  Laterality: Bilateral;    Current outpatient prescriptions:albuterol (PROVENTIL HFA) 108 (90 BASE) MCG/ACT inhaler, Inhale 2 puffs into the lungs every 6 (six) hours as needed for wheezing or shortness of breath., Disp: , Rfl: ;  B Complex Vitamins (B-COMPLEX/B-12) TABS, Take 1 tablet by mouth daily., Disp: , Rfl: ;  Biotin 10 MG TABS, Take 1 tablet by mouth daily., Disp: , Rfl:  Calcium Polycarbophil (FIBER LAXATIVE PO), Take by mouth daily. 3 capsules once daily, Disp: , Rfl: ;  docusate sodium (COLACE) 100 MG capsule, Take 200 mg by mouth daily. ,  Disp: , Rfl: ;  fexofenadine-pseudoephedrine (ALLEGRA-D) 60-120 MG per tablet, Take 1 tablet by mouth 2 (two) times daily., Disp: , Rfl: ;  ibuprofen (ADVIL,MOTRIN) 200 MG tablet, Take 200 mg by mouth as needed for pain., Disp: , Rfl:  polyethylene glycol (MIRALAX / GLYCOLAX) packet, Take 17 g by mouth 3 (three) times daily as needed., Disp: 14 each, Rfl: 0;  Probiotic Product (SOLUBLE FIBER/PROBIOTICS PO), Take 1 tablet by mouth daily., Disp: , Rfl: ;  HYDROcodone-acetaminophen (NORCO) 7.5-325 MG per tablet, Take 1 tablet by mouth every 6 (six) hours as needed for pain., Disp: , Rfl: ;  LORazepam (ATIVAN) 0.5 MG tablet, TAKE 1 TABLET EVERY 8 HOURS, Disp: 20 tablet, Rfl: 1 Magnesium 500 MG CAPS, Take 1 capsule by mouth 2 (two) times daily., Disp: , Rfl: ;  metoCLOPramide (REGLAN) 10 MG tablet, Take 1 tablet (10 mg total) by mouth 3 (three) times daily before meals., Disp: 20 tablet, Rfl: 0;  Multiple Vitamin (MULTIVITAMIN WITH MINERALS) TABS, Take 1 tablet by mouth daily., Disp: , Rfl:   Review of Systems Constitutional: negative Gastrointestinal: negative except constipation, unchanged with Murelax.  Genitourinary: negative  Objective:  BP 118/78  Ht 5\' 6"  (1.676 m)  Wt 125 lb 9.6 oz (56.972 kg)  BMI 20.28 kg/m2  LMP 10/08/2012   BMI: Body mass index is 20.28 kg/(m^2).  General Appearance: Alert, appropriate appearance for age. No acute distress HEENT: Grossly normal Neck / Thyroid:  Cardiovascular: RRR; normal S1, S2, no murmur Lungs: CTA bilaterally  Back: No CVAT Breast Exam: No dimpling, nipple retraction or discharge. No masses or nodes., Normal to inspection, Normal breast tissue bilaterally and No masses or nodes.No dimpling, nipple retraction or discharge. Gastrointestinal: Soft, non-tender, no masses or organomegaly Pelvic Exam: External genitalia: normal general appearance Vaginal: normal mucosa without prolapse or lesions, normal without tenderness, induration or masses and good  support.  Cervix: removed surgically Adnexa: normal bimanual exam Uterus: removed surgically Rectovaginal: normal rectal, no masses and guaiac negative stool obtained Lymphatic Exam: Non-palpable nodes in neck, clavicular, axillary, or inguinal regions  Skin: no rash or abnormalities Neurologic: Normal gait and speech, no tremor  Psychiatric: Alert and oriented, appropriate affect.  Urinalysis:Not done  Mallory Shirk. MD Pgr [phone] 9:24 AM

## 2013-12-02 ENCOUNTER — Ambulatory Visit (INDEPENDENT_AMBULATORY_CARE_PROVIDER_SITE_OTHER)
Admission: RE | Admit: 2013-12-02 | Discharge: 2013-12-02 | Disposition: A | Discharge status: Home / Self Care | Payer: 59 | Source: Ambulatory Visit | Attending: Internal Medicine | Admitting: Internal Medicine

## 2013-12-02 DIAGNOSIS — R918 Other nonspecific abnormal finding of lung field: Secondary | ICD-10-CM

## 2014-02-11 ENCOUNTER — Telehealth: Payer: Self-pay | Admitting: Internal Medicine

## 2014-02-11 MED ORDER — PANTOPRAZOLE SODIUM 40 MG PO TBEC: 40.0000 mg | DELAYED_RELEASE_TABLET | Freq: Every day | ORAL | Status: DC

## 2014-02-11 NOTE — Telephone Encounter (Signed)
Pt aware of Rx sent to pharmacy

## 2014-02-17 ENCOUNTER — Telehealth: Payer: Self-pay | Admitting: Internal Medicine

## 2014-02-17 MED ORDER — BENZONATATE 200 MG PO CAPS: 200.0000 mg | ORAL_CAPSULE | Freq: Three times a day (TID) | ORAL | Status: DC | PRN

## 2014-02-17 MED ORDER — AZITHROMYCIN 250 MG PO TABS: ORAL_TABLET | ORAL | Status: DC

## 2014-02-17 NOTE — Telephone Encounter (Signed)
URI- given Z pak and tessalon

## 2014-02-18 ENCOUNTER — Other Ambulatory Visit: Payer: Self-pay | Admitting: Internal Medicine

## 2014-02-18 MED ORDER — PROMETHAZINE-CODEINE 6.25-10 MG/5ML PO SYRP: ORAL_SOLUTION | ORAL | Status: DC

## 2014-02-23 ENCOUNTER — Encounter: Payer: Self-pay | Admitting: Internal Medicine

## 2014-02-23 ENCOUNTER — Ambulatory Visit (INDEPENDENT_AMBULATORY_CARE_PROVIDER_SITE_OTHER): Payer: 59 | Admitting: Internal Medicine

## 2014-02-23 ENCOUNTER — Encounter (INDEPENDENT_AMBULATORY_CARE_PROVIDER_SITE_OTHER): Payer: Self-pay

## 2014-02-23 VITALS — BP 116/60 | HR 67 | Ht 65.0 in | Wt 122.6 lb

## 2014-02-23 DIAGNOSIS — R918 Other nonspecific abnormal finding of lung field: Secondary | ICD-10-CM

## 2014-02-23 DIAGNOSIS — J45991 Cough variant asthma: Secondary | ICD-10-CM | POA: Insufficient documentation

## 2014-02-23 NOTE — Assessment & Plan Note (Signed)
Favor the nodules and elevated ACE level to indicate Sarcoid. Atypical AFB or some malignancy seen as less likely. Partial improvement favors an inflammatory process. Plan- repeat CT chest 1 year- 2016

## 2014-02-23 NOTE — Assessment & Plan Note (Signed)
Normal spirometry, improvement with bronchodilator. Plan- maintenance inhaler that she is encouraged to use routinely:    Breo samples for trial, instead of Advair

## 2014-02-23 NOTE — Patient Instructions (Addendum)
Office spirometry    Dx asthma mild intermittent complicated- done  Sample x 2 Breo Ellipta 100    1 puff then rinse mouth, once daily- maintenance  Ok to use otc cough syrup like Delsym, decongestant like Sudafed-PE, throat lozenges  Ok to use the Dymista regularly 1-2 puffs each nostril once daily at bedtime

## 2014-02-23 NOTE — Progress Notes (Signed)
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 Hosp this summer for hysterectomy/ fibroids. CT abd/pelvis noted multiple small L lung nodules. No TB exposure and PPD always negative. No prior history of lung nodules. Father had smoked. Some history of asthma and bronchitis without pneumonia. No routine cough, phlegm, night sweats, swollen glands. CT abd/pelvis 09/29/12 IMPRESSION:  1. Significantly enlarged, fibroid uterus compressing the right  ureter, causing minimal right hydronephrosis.  2. 2.2 cm right ovarian dermoid and adjacent 3.2 cm cyst.  3. Multiple small, noncalcified nodules in the left lower lobe. If  the patient is at high risk for bronchogenic carcinoma, follow-up  chest CT at 6-12 months is recommended. If the patient is at low  risk for bronchogenic carcinoma, follow-up chest CT at 12 months is  recommended. This recommendation follows the consensus statement:  Guidelines for Management of Small Pulmonary Nodules Detected on CT  Scans: A Statement from the [place] as published in  Radiology 2005; 237:395-400.  Original Report Authenticated By: Claudie Revering, M.D.  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 FOLLOWS FOR: has had more flare ups recently with cough and wheezing like symptoms Recent cold- nasal congestion, dry cough. Usually no wheeze. Not using Advair regularly.Rescue inhaler does help. Minimal SOB, but she gets anxious when cough persists. Has had frequent reflux but controlled now w Protonix. ACE level 76 9/ 2014 c/w sarcoid Lung nodules somewhat improved on last CT, with f/u pending in one year. Office spirometry 02/23/14-  ROS-see HPI Constitutional:   No-   weight loss, night sweats, fevers, chills, fatigue, lassitude. HEENT:   + headaches, difficulty swallowing, tooth/dental problems, sore throat,      No-  sneezing, itching, ear ache, +nasal congestion, post nasal drip,  CV:  No-   chest pain, orthopnea, PND, swelling in lower extremities, anasarca, dizziness, palpitations Resp: + shortness of breath with exertion or at rest.              No-   productive cough,  + non-productive cough,  No- coughing up of blood.              No-   change in color of mucus.  No- wheezing.   Skin: No-   rash or lesions. GI:  No-   heartburn, indigestion, abdominal pain, nausea, vomiting,  GU:  MS:  No c/o Neuro-     nothing unusual Psych:  No- change in mood or affect. No depression + anxiety.  No memory loss.  OBJ- Physical Exam General- Alert, Oriented, Affect-appropriate, Distress- none Skin- rash-none, lesions- none, excoriation- none Lymphadenopathy- none Head- atraumatic            Eyes- Gross vision intact, PERRLA, conjunctivae and secretions clear            Ears- Hearing, canals-normal            Nose- Clear, no-Septal dev, mucus, polyps, erosion, perforation             Throat- Mallampati II , mucosa clear , drainage- none, tonsils- atrophic Neck- flexible , trachea midline, no stridor , thyroid nl, carotid no bruit Chest - symmetrical excursion , unlabored           Heart/CV- RRR , no murmur , no gallop  , no rub, nl s1 s2                           -  JVD- none , edema- none, stasis changes- none, varices- none           Lung- clear to P&A, wheeze- none, cough+dry , dullness-none, rub- none           Chest wall-  Abd-  Br/ Gen/ Rectal- Not done, not indicated Extrem- cyanosis- none, clubbing, none, atrophy- none, strength- nl Neuro- grossly intact to observation

## 2014-11-23 ENCOUNTER — Other Ambulatory Visit: Payer: Self-pay | Admitting: Pulmonary Disease

## 2014-11-23 DIAGNOSIS — R0600 Dyspnea, unspecified: Secondary | ICD-10-CM

## 2015-01-04 ENCOUNTER — Other Ambulatory Visit: Payer: Self-pay

## 2015-01-04 MED ORDER — PROMETHAZINE-CODEINE 6.25-10 MG/5ML PO SYRP: 5.0000 mL | ORAL_SOLUTION | Freq: Four times a day (QID) | ORAL | Status: DC | PRN

## 2015-05-09 ENCOUNTER — Other Ambulatory Visit: Payer: Self-pay | Admitting: Internal Medicine

## 2015-05-09 DIAGNOSIS — J45909 Unspecified asthma, uncomplicated: Secondary | ICD-10-CM | POA: Diagnosis not present

## 2015-05-09 DIAGNOSIS — Z0000 Encounter for general adult medical examination without abnormal findings: Secondary | ICD-10-CM | POA: Diagnosis not present

## 2015-05-09 DIAGNOSIS — Z23 Encounter for immunization: Secondary | ICD-10-CM | POA: Diagnosis not present

## 2015-05-09 DIAGNOSIS — N6320 Unspecified lump in the left breast, unspecified quadrant: Secondary | ICD-10-CM

## 2015-05-09 DIAGNOSIS — M545 Low back pain: Secondary | ICD-10-CM | POA: Diagnosis not present

## 2015-05-09 DIAGNOSIS — K589 Irritable bowel syndrome without diarrhea: Secondary | ICD-10-CM | POA: Diagnosis not present

## 2015-05-09 DIAGNOSIS — K219 Gastro-esophageal reflux disease without esophagitis: Secondary | ICD-10-CM | POA: Diagnosis not present

## 2015-05-17 ENCOUNTER — Other Ambulatory Visit: Payer: 59

## 2015-05-31 ENCOUNTER — Ambulatory Visit
Admission: RE | Admit: 2015-05-31 | Discharge: 2015-05-31 | Disposition: A | Discharge status: Home / Self Care | Payer: 59 | Source: Ambulatory Visit | Attending: Internal Medicine | Admitting: Internal Medicine

## 2015-05-31 ENCOUNTER — Other Ambulatory Visit: Payer: Self-pay | Admitting: Internal Medicine

## 2015-05-31 DIAGNOSIS — N6489 Other specified disorders of breast: Secondary | ICD-10-CM | POA: Diagnosis not present

## 2015-05-31 DIAGNOSIS — N6320 Unspecified lump in the left breast, unspecified quadrant: Secondary | ICD-10-CM

## 2015-05-31 DIAGNOSIS — R928 Other abnormal and inconclusive findings on diagnostic imaging of breast: Secondary | ICD-10-CM

## 2015-05-31 HISTORY — PX: BREAST EXCISIONAL BIOPSY: SUR124

## 2015-06-07 ENCOUNTER — Ambulatory Visit
Admission: RE | Admit: 2015-06-07 | Discharge: 2015-06-07 | Disposition: A | Discharge status: Home / Self Care | Payer: 59 | Source: Ambulatory Visit | Attending: Internal Medicine | Admitting: Internal Medicine

## 2015-06-07 ENCOUNTER — Other Ambulatory Visit: Payer: Self-pay | Admitting: Internal Medicine

## 2015-06-07 DIAGNOSIS — R921 Mammographic calcification found on diagnostic imaging of breast: Secondary | ICD-10-CM | POA: Diagnosis not present

## 2015-06-07 DIAGNOSIS — R928 Other abnormal and inconclusive findings on diagnostic imaging of breast: Secondary | ICD-10-CM

## 2015-06-07 DIAGNOSIS — D242 Benign neoplasm of left breast: Secondary | ICD-10-CM | POA: Diagnosis not present

## 2015-06-14 ENCOUNTER — Telehealth: Payer: Self-pay | Admitting: Internal Medicine

## 2015-06-14 DIAGNOSIS — R918 Other nonspecific abnormal finding of lung field: Secondary | ICD-10-CM

## 2015-06-14 NOTE — Telephone Encounter (Signed)
Cough onset after eating. Suspects possibly a reflux event. Takes protonix and probiotic. Treated with neb xop. Chest is clear with quiet breath sounds, slight cough on deep breath. Known lung nodules being followed for possible MAIC Plan- neb done. Continue routine meds and reflux precautions.  Schedule f/u non-contrast CT for dx lung nodules

## 2015-06-14 NOTE — Addendum Note (Signed)
Addended by: Clayborne Dana C on: 06/14/2015 12:56 PM   Modules accepted: Orders

## 2015-06-21 MED FILL — LORazepam 0.5 MG TABS: 0.5 | 7 days supply | Qty: 30 | Fill #0

## 2015-06-28 ENCOUNTER — Other Ambulatory Visit: Payer: Self-pay | Admitting: General Surgery

## 2015-06-28 DIAGNOSIS — D242 Benign neoplasm of left breast: Secondary | ICD-10-CM | POA: Diagnosis not present

## 2015-07-03 ENCOUNTER — Ambulatory Visit (INDEPENDENT_AMBULATORY_CARE_PROVIDER_SITE_OTHER)
Admission: RE | Admit: 2015-07-03 | Discharge: 2015-07-03 | Disposition: A | Discharge status: Home / Self Care | Payer: 59 | Source: Ambulatory Visit | Attending: Internal Medicine | Admitting: Internal Medicine

## 2015-07-03 DIAGNOSIS — R918 Other nonspecific abnormal finding of lung field: Secondary | ICD-10-CM | POA: Diagnosis not present

## 2015-07-03 DIAGNOSIS — D869 Sarcoidosis, unspecified: Secondary | ICD-10-CM

## 2015-07-04 ENCOUNTER — Telehealth: Payer: Self-pay | Admitting: Internal Medicine

## 2015-07-04 ENCOUNTER — Other Ambulatory Visit: Payer: Self-pay | Admitting: General Surgery

## 2015-07-04 DIAGNOSIS — D242 Benign neoplasm of left breast: Secondary | ICD-10-CM

## 2015-07-04 DIAGNOSIS — D869 Sarcoidosis, unspecified: Secondary | ICD-10-CM

## 2015-07-04 MED ORDER — PREDNISONE 10 MG PO TABS: ORAL_TABLET | ORAL | Status: DC

## 2015-07-04 NOTE — Telephone Encounter (Signed)
I called Dr Lyndon Code, Pathologist, to confirm the breast nodule is definitely not sarcoid in the breast.

## 2015-07-04 NOTE — Assessment & Plan Note (Signed)
See phone note

## 2015-07-04 NOTE — Telephone Encounter (Signed)
CT chest shows progressive upper lobe reticular-nodular disease c/w sarcoid. ACE was elevated previously. Discussed prednisone and side-effects. Hx prior mood changes with prednisone for asthma. We are deliberately starting low to see how she does.  Plan- 15 mg daily x 7 days. Then 10 mg daily.   Rx sent to [facility] with me in one month w CXR and ACE

## 2015-07-11 ENCOUNTER — Telehealth: Payer: Self-pay | Admitting: Internal Medicine

## 2015-07-11 MED ORDER — HYDROXYCHLOROQUINE SULFATE 200 MG PO TABS: 400.0000 mg | ORAL_TABLET | Freq: Every day | ORAL | Status: DC

## 2015-07-11 NOTE — Telephone Encounter (Signed)
She again expresses concern that prednisone will cause unacceptable mood changes despit access to ativan. Discussed alternatives- suggest trial first of plaquenil. Monitor progression in hopes of spontaneous remission eventually.  Printed Rx Plaquenil 200 mg, # 60,  2 daily, ref x 2       She will start after lumpectomy.

## 2015-07-20 ENCOUNTER — Encounter (HOSPITAL_BASED_OUTPATIENT_CLINIC_OR_DEPARTMENT_OTHER): Payer: Self-pay | Admitting: *Deleted

## 2015-07-27 ENCOUNTER — Ambulatory Visit
Admission: RE | Admit: 2015-07-27 | Discharge: 2015-07-27 | Disposition: A | Discharge status: Home / Self Care | Payer: 59 | Source: Ambulatory Visit | Attending: General Surgery | Admitting: General Surgery

## 2015-07-27 DIAGNOSIS — D242 Benign neoplasm of left breast: Secondary | ICD-10-CM | POA: Diagnosis not present

## 2015-07-28 ENCOUNTER — Ambulatory Visit
Admission: RE | Admit: 2015-07-28 | Discharge: 2015-07-28 | Disposition: A | Discharge status: Home / Self Care | Payer: 59 | Source: Ambulatory Visit | Attending: General Surgery | Admitting: General Surgery

## 2015-07-28 ENCOUNTER — Ambulatory Visit (HOSPITAL_BASED_OUTPATIENT_CLINIC_OR_DEPARTMENT_OTHER): Payer: 59 | Admitting: Anesthesiology

## 2015-07-28 ENCOUNTER — Ambulatory Visit (HOSPITAL_BASED_OUTPATIENT_CLINIC_OR_DEPARTMENT_OTHER)
Admission: RE | Admit: 2015-07-28 | Discharge: 2015-07-28 | Disposition: A | Discharge status: Home / Self Care | Payer: 59 | Source: Ambulatory Visit | Attending: General Surgery | Admitting: General Surgery

## 2015-07-28 ENCOUNTER — Encounter (HOSPITAL_BASED_OUTPATIENT_CLINIC_OR_DEPARTMENT_OTHER): Payer: Self-pay | Admitting: *Deleted

## 2015-07-28 ENCOUNTER — Encounter (HOSPITAL_BASED_OUTPATIENT_CLINIC_OR_DEPARTMENT_OTHER)
Admission: RE | Disposition: A | Discharge status: Home / Self Care | Payer: Self-pay | Source: Ambulatory Visit | Attending: General Surgery

## 2015-07-28 DIAGNOSIS — J45909 Unspecified asthma, uncomplicated: Secondary | ICD-10-CM | POA: Diagnosis not present

## 2015-07-28 DIAGNOSIS — Z791 Long term (current) use of non-steroidal anti-inflammatories (NSAID): Secondary | ICD-10-CM | POA: Diagnosis not present

## 2015-07-28 DIAGNOSIS — D242 Benign neoplasm of left breast: Secondary | ICD-10-CM | POA: Insufficient documentation

## 2015-07-28 DIAGNOSIS — Z79899 Other long term (current) drug therapy: Secondary | ICD-10-CM | POA: Diagnosis not present

## 2015-07-28 HISTORY — DX: Reserved for inherently not codable concepts without codable children: IMO0001

## 2015-07-28 HISTORY — PX: BREAST LUMPECTOMY WITH RADIOACTIVE SEED LOCALIZATION: SHX6424

## 2015-07-28 HISTORY — DX: Sarcoidosis of lung: D86.0

## 2015-07-28 SURGERY — BREAST LUMPECTOMY WITH RADIOACTIVE SEED LOCALIZATION
Anesthesia: General | Site: Breast | Laterality: Left

## 2015-07-28 MED ORDER — MIDAZOLAM HCL 2 MG/2ML IJ SOLN
1.0000 mg | INTRAMUSCULAR | Status: DC | PRN
  Administered 2015-07-28: 2 mg via INTRAVENOUS

## 2015-07-28 MED ORDER — LIDOCAINE HCL (CARDIAC) 20 MG/ML IV SOLN
INTRAVENOUS | Status: DC | PRN
  Administered 2015-07-28: 50 mg via INTRAVENOUS

## 2015-07-28 MED ORDER — PROPOFOL 10 MG/ML IV BOLUS
INTRAVENOUS | Status: DC | PRN
  Administered 2015-07-28: 140 mg via INTRAVENOUS

## 2015-07-28 MED ORDER — BUPIVACAINE-EPINEPHRINE (PF) 0.25% -1:200000 IJ SOLN
INTRAMUSCULAR | Status: DC | PRN
  Administered 2015-07-28: 10 mL

## 2015-07-28 MED ORDER — HYDROCODONE-ACETAMINOPHEN 5-325 MG PO TABS: 1.0000 | ORAL_TABLET | Freq: Four times a day (QID) | ORAL | Status: DC | PRN

## 2015-07-28 MED ORDER — LACTATED RINGERS IV SOLN
INTRAVENOUS | Status: DC
  Administered 2015-07-28 (×2): via INTRAVENOUS

## 2015-07-28 MED ORDER — FENTANYL CITRATE (PF) 100 MCG/2ML IJ SOLN
50.0000 ug | INTRAMUSCULAR | Status: DC | PRN
  Administered 2015-07-28: 100 ug via INTRAVENOUS
  Administered 2015-07-28: 50 ug via INTRAVENOUS

## 2015-07-28 MED ORDER — GLYCOPYRROLATE 0.2 MG/ML IJ SOLN
0.2000 mg | Freq: Once | INTRAMUSCULAR | Status: AC | PRN
  Administered 2015-07-28: 0.2 mg via INTRAVENOUS

## 2015-07-28 MED ORDER — MIDAZOLAM HCL 2 MG/2ML IJ SOLN
INTRAMUSCULAR | Status: AC
  Filled 2015-07-28: qty 2

## 2015-07-28 MED ORDER — ONDANSETRON HCL 4 MG/2ML IJ SOLN
INTRAMUSCULAR | Status: DC | PRN
  Administered 2015-07-28: 4 mg via INTRAVENOUS

## 2015-07-28 MED ORDER — FENTANYL CITRATE (PF) 100 MCG/2ML IJ SOLN: 25.0000 ug | INTRAMUSCULAR | Status: DC | PRN

## 2015-07-28 MED ORDER — SCOPOLAMINE 1 MG/3DAYS TD PT72: 1.0000 | MEDICATED_PATCH | Freq: Once | TRANSDERMAL | Status: DC | PRN

## 2015-07-28 MED ORDER — ONDANSETRON HCL 4 MG/2ML IJ SOLN
INTRAMUSCULAR | Status: AC
Start: 2015-07-28 — End: 2015-07-28
  Filled 2015-07-28: qty 2

## 2015-07-28 MED ORDER — PROPOFOL 10 MG/ML IV BOLUS
INTRAVENOUS | Status: AC
Start: 2015-07-28 — End: 2015-07-28
  Filled 2015-07-28: qty 20

## 2015-07-28 MED ORDER — CHLORHEXIDINE GLUCONATE 4 % EX LIQD: 1.0000 "application " | Freq: Once | CUTANEOUS | Status: DC

## 2015-07-28 MED ORDER — FENTANYL CITRATE (PF) 100 MCG/2ML IJ SOLN
INTRAMUSCULAR | Status: AC
  Filled 2015-07-28: qty 2

## 2015-07-28 MED ORDER — CEFAZOLIN SODIUM-DEXTROSE 2-4 GM/100ML-% IV SOLN
INTRAVENOUS | Status: AC
  Filled 2015-07-28: qty 100

## 2015-07-28 MED ORDER — DEXAMETHASONE SODIUM PHOSPHATE 4 MG/ML IJ SOLN
INTRAMUSCULAR | Status: DC | PRN
  Administered 2015-07-28: 10 mg via INTRAVENOUS

## 2015-07-28 MED ORDER — DEXAMETHASONE SODIUM PHOSPHATE 10 MG/ML IJ SOLN
INTRAMUSCULAR | Status: AC
Start: 2015-07-28 — End: 2015-07-28
  Filled 2015-07-28: qty 1

## 2015-07-28 MED ORDER — CEFAZOLIN SODIUM-DEXTROSE 2-4 GM/100ML-% IV SOLN
2.0000 g | INTRAVENOUS | Status: AC
  Administered 2015-07-28: 2 g via INTRAVENOUS

## 2015-07-28 MED ORDER — GLYCOPYRROLATE 0.2 MG/ML IV SOSY
PREFILLED_SYRINGE | INTRAVENOUS | Status: AC
  Filled 2015-07-28: qty 3

## 2015-07-28 SURGICAL SUPPLY — 42 items
APPLIER CLIP 9.375 MED OPEN (MISCELLANEOUS)
BLADE SURG 15 STRL LF DISP TIS (BLADE) ×1 IMPLANT
BLADE SURG 15 STRL SS (BLADE) ×2
CANISTER SUC SOCK COL 7IN (MISCELLANEOUS) IMPLANT
CANISTER SUCT 1200ML W/VALVE (MISCELLANEOUS) IMPLANT
CHLORAPREP W/TINT 26ML (MISCELLANEOUS) ×3 IMPLANT
CLIP APPLIE 9.375 MED OPEN (MISCELLANEOUS) IMPLANT
COVER BACK TABLE 60X90IN (DRAPES) ×3 IMPLANT
COVER MAYO STAND STRL (DRAPES) ×3 IMPLANT
COVER PROBE W GEL 5X96 (DRAPES) ×3 IMPLANT
DECANTER SPIKE VIAL GLASS SM (MISCELLANEOUS) IMPLANT
DEVICE DUBIN W/COMP PLATE 8390 (MISCELLANEOUS) ×3 IMPLANT
DRAPE LAPAROSCOPIC ABDOMINAL (DRAPES) ×3 IMPLANT
DRAPE UTILITY XL STRL (DRAPES) ×3 IMPLANT
ELECT COATED BLADE 2.86 ST (ELECTRODE) ×3 IMPLANT
ELECT REM PT RETURN 9FT ADLT (ELECTROSURGICAL) ×3
ELECTRODE REM PT RTRN 9FT ADLT (ELECTROSURGICAL) ×1 IMPLANT
GLOVE BIO SURGEON STRL SZ 6.5 (GLOVE) ×2 IMPLANT
GLOVE BIO SURGEON STRL SZ7.5 (GLOVE) ×6 IMPLANT
GLOVE BIO SURGEONS STRL SZ 6.5 (GLOVE) ×1
GLOVE BIOGEL PI IND STRL 7.0 (GLOVE) ×1 IMPLANT
GLOVE BIOGEL PI INDICATOR 7.0 (GLOVE) ×2
GLOVE EXAM NITRILE EXT CUFF MD (GLOVE) ×3 IMPLANT
GOWN STRL REUS W/ TWL LRG LVL3 (GOWN DISPOSABLE) ×2 IMPLANT
GOWN STRL REUS W/TWL LRG LVL3 (GOWN DISPOSABLE) ×4
KIT MARKER MARGIN INK (KITS) ×3 IMPLANT
LIQUID BAND (GAUZE/BANDAGES/DRESSINGS) ×3 IMPLANT
NEEDLE HYPO 25X1 1.5 SAFETY (NEEDLE) ×3 IMPLANT
NS IRRIG 1000ML POUR BTL (IV SOLUTION) ×3 IMPLANT
PACK BASIN DAY SURGERY FS (CUSTOM PROCEDURE TRAY) ×3 IMPLANT
PENCIL BUTTON HOLSTER BLD 10FT (ELECTRODE) ×3 IMPLANT
SLEEVE SCD COMPRESS KNEE MED (MISCELLANEOUS) ×3 IMPLANT
SPONGE LAP 18X18 X RAY DECT (DISPOSABLE) ×3 IMPLANT
SUT MON AB 4-0 PC3 18 (SUTURE) ×3 IMPLANT
SUT SILK 2 0 SH (SUTURE) IMPLANT
SUT VICRYL 3-0 CR8 SH (SUTURE) ×3 IMPLANT
SYR CONTROL 10ML LL (SYRINGE) ×3 IMPLANT
TOWEL OR 17X24 6PK STRL BLUE (TOWEL DISPOSABLE) ×3 IMPLANT
TOWEL OR NON WOVEN STRL DISP B (DISPOSABLE) IMPLANT
TUBE CONNECTING 20'X1/4 (TUBING)
TUBE CONNECTING 20X1/4 (TUBING) IMPLANT
YANKAUER SUCT BULB TIP NO VENT (SUCTIONS) IMPLANT

## 2015-07-28 NOTE — H&P (Signed)
Kathleen Key  Location: [facility] Patient #: [id number] DOB: 10-08-1965 Married / Language: English / Race: White Female   History of Present Illness Patient words: breast ca.  The patient is a 50 year old female who presents with a breast mass. We are asked to see the patient in consultation by Dr. Lovey Newcomer to evaluate her for some abnormal calcifications in the lateral left breast. The patient is a 50 year old white female who recently went for a routine screening mammogram. At that time she was found to have a 3 cm area of abnormal calcification in the outer left breast. This was biopsied and came back as an intraductal papilloma with calcification. She denied any breast pain or discharge from the nipple.   Other Problems  Asthma Other disease, cancer, significant illness  Past Surgical History  Breast Biopsy Left. Hysterectomy (not due to cancer) - Partial  Diagnostic Studies History  Colonoscopy never Mammogram within last year  Medication History Albuterol Sulfate HFA (108 (90 Base)MCG/ACT Aerosol Soln, Inhalation) Active. Biotin (1MG  Capsule, Oral) Active. Multivitamin Adult (Oral) Active. Naproxen Sodium (220MG  Capsule, Oral) Active. Probiotic & Acidophilus Ex St (Oral) Active. Pantoprazole Sodium (40MG  Tablet DR, Oral) Active. Medications Reconciled  Social History Alcohol use Occasional alcohol use. No caffeine use No drug use Tobacco use Never smoker.  Family History  Arthritis Father, Mother, Sister. Cerebrovascular Accident Father. Heart Disease Father. Heart disease in female family member before age 46 Heart disease in female family member before age 46 Hypertension Father, Mother. Respiratory Condition Father.  Pregnancy / Birth History  Age at menarche 41 years. Gravida 0 Irregular periods Para 0    Review of Systems  General Not Present- Appetite Loss, Chills, Fatigue, Fever, Night Sweats,  Weight Gain and Weight Loss. Skin Not Present- Change in Wart/Mole, Dryness, Hives, Jaundice, New Lesions, Non-Healing Wounds, Rash and Ulcer. HEENT Not Present- Earache, Hearing Loss, Hoarseness, Nose Bleed, Oral Ulcers, Ringing in the Ears, Seasonal Allergies, Sinus Pain, Sore Throat, Visual Disturbances, Wears glasses/contact lenses and Yellow Eyes. Breast Present- Breast Pain. Not Present- Breast Mass, Nipple Discharge and Skin Changes. Cardiovascular Not Present- Chest Pain, Difficulty Breathing Lying Down, Leg Cramps, Palpitations, Rapid Heart Rate, Shortness of Breath and Swelling of Extremities. Gastrointestinal Present- Abdominal Pain, Constipation and Indigestion. Not Present- Bloating, Bloody Stool, Change in Bowel Habits, Chronic diarrhea, Difficulty Swallowing, Excessive gas, Gets full quickly at meals, Hemorrhoids, Nausea, Rectal Pain and Vomiting. Female Genitourinary Not Present- Frequency, Nocturia, Painful Urination, Pelvic Pain and Urgency. Musculoskeletal Not Present- Back Pain, Joint Pain, Joint Stiffness, Muscle Pain, Muscle Weakness and Swelling of Extremities. Neurological Not Present- Decreased Memory, Fainting, Headaches, Numbness, Seizures, Tingling, Tremor, Trouble walking and Weakness. Psychiatric Not Present- Anxiety, Bipolar, Change in Sleep Pattern, Depression, Fearful and Frequent crying. Endocrine Not Present- Cold Intolerance, Excessive Hunger, Hair Changes, Heat Intolerance, Hot flashes and New Diabetes. Hematology Not Present- Easy Bruising, Excessive bleeding, Gland problems, HIV and Persistent Infections.  Vitals Weight: 119 lb Height: 65in Body Surface Area: 1.59 m Body Mass Index: 19.8 kg/m  Temp.: 97.49F(Temporal)  Pulse: 81 (Regular)  BP: 120/75 (Sitting, Left Arm, Standard)       Physical Exam General Mental Status-Alert. General Appearance-Consistent with stated age. Hydration-Well hydrated. Voice-Normal.  Head and  Neck Head-normocephalic, atraumatic with no lesions or palpable masses. Trachea-midline. Thyroid Gland Characteristics - normal size and consistency.  Eye Eyeball - Bilateral-Extraocular movements intact. Sclera/Conjunctiva - Bilateral-No scleral icterus.  Chest and Lung Exam Chest and lung exam reveals -quiet,  even and easy respiratory effort with no use of accessory muscles and on auscultation, normal breath sounds, no adventitious sounds and normal vocal resonance. Inspection Chest Wall - Normal. Back - normal.  Breast Note: There is a palpable firmness in the 12:00 subareolar left breast which corresponds to the needle entry site. There is no other palpable mass in either breast. There is no palpable axillary, supraclavicular, or cervical lymphadenopathy except for one small mobile palpable node in the left axilla   Cardiovascular Cardiovascular examination reveals -normal heart sounds, regular rate and rhythm with no murmurs and normal pedal pulses bilaterally.  Abdomen Inspection Inspection of the abdomen reveals - No Hernias. Skin - Scar - no surgical scars. Palpation/Percussion Palpation and Percussion of the abdomen reveal - Soft, Non Tender, No Rebound tenderness, No Rigidity (guarding) and No hepatosplenomegaly. Auscultation Auscultation of the abdomen reveals - Bowel sounds normal.  Neurologic Neurologic evaluation reveals -alert and oriented x 3 with no impairment of recent or remote memory. Mental Status-Normal.  Musculoskeletal Normal Exam - Left-Upper Extremity Strength Normal and Lower Extremity Strength Normal. Normal Exam - Right-Upper Extremity Strength Normal and Lower Extremity Strength Normal.  Lymphatic Head & Neck  General Head & Neck Lymphatics: Bilateral - Description - Normal. Axillary  General Axillary Region: Bilateral - Description - Normal. Tenderness - Non Tender. Femoral & Inguinal  Generalized Femoral & Inguinal  Lymphatics: Bilateral - Description - Normal. Tenderness - Non Tender.    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 This was biopsied and came back as an intraductal papilloma. Because of its abnormal appearance the recommendation is to have this area excised. I have discussed with her in detail the risks and benefits of the operation to do this as well as some of the technical aspects and she understands and wishes to proceed. I will plan for a left breast radioactive seed localized lumpectomy Current Plans Pt Education - Breast Diseases: discussed with patient and provided information.   Signed by Luella Cook, MD

## 2015-07-28 NOTE — Anesthesia Procedure Notes (Signed)
Procedure Name: LMA Insertion Date/Time: 07/28/2015 1:28 PM Performed by: Lyndee Leo Pre-anesthesia Checklist: Patient identified, Emergency Drugs available, Suction available and Patient being monitored Patient Re-evaluated:Patient Re-evaluated prior to inductionOxygen Delivery Method: Circle System Utilized Preoxygenation: Pre-oxygenation with 100% oxygen Intubation Type: IV induction Ventilation: Mask ventilation without difficulty LMA: LMA inserted LMA Size: 3.0 Number of attempts: 1 Airway Equipment and Method: Bite block Placement Confirmation: positive ETCO2 Tube secured with: Tape Dental Injury: Teeth and Oropharynx as per pre-operative assessment

## 2015-07-28 NOTE — Op Note (Signed)
07/28/2015  2:12 PM  PATIENT:  [name]  50 y.o. female  PRE-OPERATIVE DIAGNOSIS:  Left breast papilloma   POST-OPERATIVE DIAGNOSIS:  Left breast papilloma   PROCEDURE:  Procedure(s): LEFT BREAST LUMPECTOMY WITH RADIOACTIVE SEED LOCALIZATION (Left)  SURGEON:  Surgeon(s) and Role:    * Jovita Kussmaul, MD - Primary  PHYSICIAN ASSISTANT:   ASSISTANTS: none   ANESTHESIA:   general  EBL:  Total I/O In: 1000 [I.V.:1000] Out: -   BLOOD ADMINISTERED:none  DRAINS: none   LOCAL MEDICATIONS USED:  MARCAINE     SPECIMEN:  Source of Specimen:  left breast tissue  DISPOSITION OF SPECIMEN:  PATHOLOGY  COUNTS:  YES  TOURNIQUET:  * No tourniquets in log *  DICTATION: .Dragon Dictation   After informed consent was obtained the patient was brought to the operating room and placed in the supine position on the operating room table. After adequate induction of general anesthesia the patient's left breast was prepped with ChloraPrep, allowed to dry, and draped in usual sterile manner. A timeout was performed. Previously an I-125 seed was placed in the outer subareolar region of the left breast to mark an area of an intraductal papilloma. The neoprobe was sent to I-125. The area of radioactivity was readily identified at the edge of the areola in the upper outer aspect of the left breast. A curvilinear incision was made with a 15 blade knife at the edge of the areola. The incision was carried through the skin and subcutaneous tissue sharply with electrocautery. While checking the area of the radioactive seed frequently with the neoprobe a circular portion of breast tissue was excised sharply around the radioactive seed. Once the tissue was removed it was oriented with the appropriate paint color. A specimen radiograph was obtained that showed the clip in seed to be near the center of the specimen. The specimen was then sent to pathology for further evaluation. Hemostasis was achieved using the  Bovie electrocautery. The wound was infiltrated with quarter percent Marcaine and irrigated with saline. The deep layer of the wound was closed with interrupted 3-0 Vicryl stitches. The skin was then closed with interrupted 4-0 Monocryl subcuticular stitches. Dermabond dressings were applied. The patient tolerated the procedure well. At the end of the case all needle sponge and instrument counts were correct. The patient was then awakened and taken to recovery in stable condition.  PLAN OF CARE: Discharge to home after PACU  PATIENT DISPOSITION:  PACU - hemodynamically stable.   Delay start of Pharmacological VTE agent (>24hrs) due to surgical blood loss or risk of bleeding: not applicable

## 2015-07-28 NOTE — Discharge Instructions (Signed)

## 2015-07-28 NOTE — Anesthesia Postprocedure Evaluation (Signed)
Anesthesia Post Note  Patient: Kathleen Key  Procedure(s) Performed: Procedure(s) (LRB): LEFT BREAST LUMPECTOMY WITH RADIOACTIVE SEED LOCALIZATION (Left)  Patient location during evaluation: PACU Anesthesia Type: General Level of consciousness: awake Pain management: pain level controlled Vital Signs Assessment: post-procedure vital signs reviewed and stable Respiratory status: spontaneous breathing Cardiovascular status: stable Anesthetic complications: no    Last Vitals:  Filed Vitals:   07/28/15 1445 07/28/15 1515  BP: 122/71 121/96  Pulse: 119 64  Temp:  36.1 C  Resp: 19 14    Last Pain:  Filed Vitals:   07/28/15 1516  PainSc: 3                  EDWARDS,Roarke Marciano

## 2015-07-28 NOTE — Interval H&P Note (Signed)
History and Physical Interval Note:  07/28/2015 1:12 PM  Kathleen Key  has presented today for surgery, with the diagnosis of Left breast papilloma   The various methods of treatment have been discussed with the patient and family. After consideration of risks, benefits and other options for treatment, the patient has consented to  Procedure(s): LEFT BREAST LUMPECTOMY WITH RADIOACTIVE SEED LOCALIZATION (Left) as a surgical intervention .  The patient's history has been reviewed, patient examined, no change in status, stable for surgery.  I have reviewed the patient's chart and labs.  Questions were answered to the patient's satisfaction.     TOTH III,PAUL S

## 2015-07-28 NOTE — Anesthesia Preprocedure Evaluation (Signed)
Anesthesia Evaluation  Patient identified by MRN, date of birth, ID band Patient awake    Reviewed: Allergy & Precautions, NPO status , Patient's Chart, lab work & pertinent test results  Airway Mallampati: II  TM Distance: >3 FB Neck ROM: Full    Dental   Pulmonary shortness of breath, asthma ,    breath sounds clear to auscultation       Cardiovascular negative cardio ROS   Rhythm:Regular Rate:Normal     Neuro/Psych    GI/Hepatic negative GI ROS, Neg liver ROS,   Endo/Other  negative endocrine ROS  Renal/GU negative Renal ROS     Musculoskeletal   Abdominal   Peds  Hematology   Anesthesia Other Findings   Reproductive/Obstetrics                             Anesthesia Physical Anesthesia Plan  ASA: II  Anesthesia Plan: General   Post-op Pain Management:    Induction: Intravenous  Airway Management Planned: LMA  Additional Equipment:   Intra-op Plan:   Post-operative Plan: Extubation in OR  Informed Consent: I have reviewed the patients History and Physical, chart, labs and discussed the procedure including the risks, benefits and alternatives for the proposed anesthesia with the patient or authorized representative who has indicated his/her understanding and acceptance.   Dental advisory given  Plan Discussed with: CRNA and Anesthesiologist  Anesthesia Plan Comments:         Anesthesia Quick Evaluation

## 2015-07-28 NOTE — Transfer of Care (Signed)
Immediate Anesthesia Transfer of Care Note  Patient: Kathleen Key  Procedure(s) Performed: Procedure(s): LEFT BREAST LUMPECTOMY WITH RADIOACTIVE SEED LOCALIZATION (Left)  Patient Location: PACU  Anesthesia Type:General  Level of Consciousness: awake, sedated and patient cooperative  Airway & Oxygen Therapy: Patient Spontanous Breathing and Patient connected to face mask oxygen  Post-op Assessment: Report given to RN and Post -op Vital signs reviewed and stable  Post vital signs: Reviewed and stable  Last Vitals:  Filed Vitals:   07/28/15 1153  BP: 119/68  Pulse: 71  Temp: 36.7 C  Resp: 18    Last Pain: There were no vitals filed for this visit.    Patients Stated Pain Goal: 0 ([phone])  Complications: No apparent anesthesia complications

## 2015-07-31 ENCOUNTER — Encounter (HOSPITAL_BASED_OUTPATIENT_CLINIC_OR_DEPARTMENT_OTHER): Payer: Self-pay | Admitting: General Surgery

## 2015-09-05 ENCOUNTER — Encounter: Payer: Self-pay | Admitting: Internal Medicine

## 2015-09-05 NOTE — Progress Notes (Signed)
09/05/2015-documentation note. CT scan had suggested slowly progressive sarcoid changes in chest. No biopsy. She was reluctant to take prednisone because of potential for mood changes. She has now successfully completed lumpectomy for benign nodule in breast and is going to start Plaquenil 400 mg daily.

## 2015-09-07 ENCOUNTER — Telehealth: Payer: Self-pay | Admitting: Internal Medicine

## 2015-09-07 MED ORDER — LEVALBUTEROL HCL 0.63 MG/3ML IN NEBU: 0.6300 mg | INHALATION_SOLUTION | Freq: Four times a day (QID) | RESPIRATORY_TRACT | Status: DC | PRN

## 2015-09-07 MED ORDER — HYDROXYCHLOROQUINE SULFATE 200 MG PO TABS: ORAL_TABLET | ORAL | Status: DC

## 2015-09-07 MED FILL — LEVALBUTEROL 0.63 MG/3 ML S: 0.63 | 5 days supply | Qty: 72 | Fill #0

## 2015-09-07 MED FILL — HYDROXYCHLOROQUINE 200 MG T: 200 | 30 days supply | Qty: 60 | Fill #0

## 2015-09-07 NOTE — Telephone Encounter (Signed)
Needs plaquenil script rewritten for Rx of presumptive sarcoid- lung nodules and elevated ACE  Needs script for neb solution. Intolerant albuterol stimulation, asks xopenex

## 2015-10-25 ENCOUNTER — Telehealth: Payer: Self-pay | Admitting: Internal Medicine

## 2015-10-25 MED ORDER — FLUTICASONE FUROATE-VILANTEROL 100-25 MCG/INH IN AEPB: 1.0000 | INHALATION_SPRAY | Freq: Every day | RESPIRATORY_TRACT | 11 refills | Status: DC

## 2015-10-25 MED ORDER — FLUTICASONE FUROATE-VILANTEROL 100-25 MCG/INH IN AEPB: 1.0000 | INHALATION_SPRAY | Freq: Every day | RESPIRATORY_TRACT | 0 refills | Status: DC

## 2015-10-25 MED ORDER — FLUTICASONE FUROATE-VILANTEROL 100-25 MCG/INH IN AEPB: 1.0000 | INHALATION_SPRAY | Freq: Every day | RESPIRATORY_TRACT | Status: DC

## 2015-10-25 MED FILL — BREO ELLIPTA 100-25 MCG INH: 100-25 | 30 days supply | Qty: 60 | Fill #0

## 2015-10-25 NOTE — Telephone Encounter (Signed)
Rx printed for patient- I called RX in to [facility] patient pharmacy and updated Rx in EPIC.

## 2015-10-25 NOTE — Addendum Note (Signed)
Addended by: Clayborne Dana C on: 10/25/2015 10:54 AM   Modules accepted: Orders

## 2015-10-25 NOTE — Telephone Encounter (Signed)
Per CY-okay to give 1 sample of Breo and send 1 year Rx to pharmacy for patient. Pt is aware and requests the Rx be sent to [facility]. Nothing more needed at this time.

## 2015-12-13 MED FILL — BREO ELLIPTA 100-25 MCG INH: 100-25 | 30 days supply | Qty: 60 | Fill #1

## 2016-01-03 MED FILL — HYDROXYCHLOROQUINE 200 MG T: 200 | 30 days supply | Qty: 60 | Fill #1

## 2016-01-19 MED FILL — BREO ELLIPTA 100-25 MCG INH: 100-25 | 30 days supply | Qty: 60 | Fill #2

## 2016-03-01 MED FILL — BREO ELLIPTA 100-25 MCG INH: 100-25 | 30 days supply | Qty: 60 | Fill #3

## 2016-03-20 MED FILL — HYDROXYCHLOROQUINE 200 MG T: 200 | 30 days supply | Qty: 60 | Fill #2

## 2016-04-03 MED FILL — BREO ELLIPTA 100-25 MCG INH: 100-25 | 30 days supply | Qty: 60 | Fill #4

## 2016-05-09 MED FILL — BREO ELLIPTA 100-25 MCG INH: 100-25 | 30 days supply | Qty: 60 | Fill #5

## 2016-05-22 MED FILL — HYDROXYCHLOROQUINE 200 MG T: 200 | 30 days supply | Qty: 60 | Fill #3

## 2016-07-02 MED FILL — BREO ELLIPTA 100-25 MCG INH: 100-25 | 30 days supply | Qty: 60 | Fill #6

## 2016-07-24 MED FILL — HYDROXYCHLOROQUINE 200 MG T: 200 | 30 days supply | Qty: 60 | Fill #4

## 2016-08-07 ENCOUNTER — Other Ambulatory Visit (INDEPENDENT_AMBULATORY_CARE_PROVIDER_SITE_OTHER): Payer: 59

## 2016-08-07 ENCOUNTER — Ambulatory Visit (INDEPENDENT_AMBULATORY_CARE_PROVIDER_SITE_OTHER): Payer: 59 | Admitting: Internal Medicine

## 2016-08-07 ENCOUNTER — Encounter: Payer: Self-pay | Admitting: Internal Medicine

## 2016-08-07 VITALS — BP 110/70 | HR 79 | Ht 64.0 in | Wt 120.0 lb

## 2016-08-07 DIAGNOSIS — J45909 Unspecified asthma, uncomplicated: Secondary | ICD-10-CM

## 2016-08-07 DIAGNOSIS — R911 Solitary pulmonary nodule: Secondary | ICD-10-CM

## 2016-08-07 DIAGNOSIS — D869 Sarcoidosis, unspecified: Secondary | ICD-10-CM

## 2016-08-07 DIAGNOSIS — J45991 Cough variant asthma: Secondary | ICD-10-CM

## 2016-08-07 LAB — CBC WITH DIFFERENTIAL/PLATELET
BASOS ABS: 0 10*3/uL (ref 0.0–0.1)
Basophils Relative: 0.8 % (ref 0.0–3.0)
EOS ABS: 0.1 10*3/uL (ref 0.0–0.7)
Eosinophils Relative: 1.2 % (ref 0.0–5.0)
HCT: 41 % (ref 36.0–46.0)
Hemoglobin: 13.7 g/dL (ref 12.0–15.0)
Lymphocytes Relative: 22.8 % (ref 12.0–46.0)
Lymphs Abs: 1.1 10*3/uL (ref 0.7–4.0)
MCHC: 33.5 g/dL (ref 30.0–36.0)
MCV: 96.4 fl (ref 78.0–100.0)
MONO ABS: 0.4 10*3/uL (ref 0.1–1.0)
Monocytes Relative: 8.7 % (ref 3.0–12.0)
NEUTROS ABS: 3.3 10*3/uL (ref 1.4–7.7)
NEUTROS PCT: 66.5 % (ref 43.0–77.0)
PLATELETS: 300 10*3/uL (ref 150.0–400.0)
RBC: 4.25 Mil/uL (ref 3.87–5.11)
RDW: 13.4 % (ref 11.5–15.5)
WBC: 5 10*3/uL (ref 4.0–10.5)

## 2016-08-07 LAB — BASIC METABOLIC PANEL
BUN: 12 mg/dL (ref 6–23)
CHLORIDE: 102 meq/L (ref 96–112)
CO2: 32 mEq/L (ref 19–32)
CREATININE: 0.68 mg/dL (ref 0.40–1.20)
Calcium: 9.3 mg/dL (ref 8.4–10.5)
GFR: 97.71 mL/min (ref >60.00)
Glucose, Bld: 93 mg/dL (ref 70–99)
Potassium: 3.5 mEq/L (ref 3.5–5.1)
Sodium: 138 mEq/L (ref 135–145)

## 2016-08-07 LAB — HEPATIC FUNCTION PANEL
ALK PHOS: 40 U/L (ref 39–117)
ALT: 31 U/L (ref 0–35)
AST: 24 U/L (ref 0–37)
Albumin: 4.2 g/dL (ref 3.5–5.2)
BILIRUBIN DIRECT: 0.1 mg/dL (ref 0.0–0.3)
Total Bilirubin: 0.5 mg/dL (ref 0.2–1.2)
Total Protein: 7.1 g/dL (ref 6.0–8.3)

## 2016-08-07 LAB — ANGIOTENSIN CONVERTING ENZYME: Angiotensin-Converting Enzyme: 81 U/L — ABNORMAL HIGH (ref 9–67)

## 2016-08-07 LAB — NITRIC OXIDE: Nitric Oxide: UNDETERMINED

## 2016-08-07 NOTE — Assessment & Plan Note (Addendum)
Coughing paroxysms produced by using Breo, most often triggered by irritant exposures-strong odors. Often an anxiety component suggesting hyper ventilation. Some relief from nebulizer treatments. Plan

## 2016-08-07 NOTE — Patient Instructions (Signed)
Order- FENO-- dx allergic asthma  Ok to stop plaquenil now to see if that changes how you feel  Order- schedule PFT  Order- lab- CBC w diff, BMET, Hepatic profile, ACE level   Dx sarcoid  Order- schedule CT chest no contrast   Dx lung nodules, sarcoid

## 2016-08-07 NOTE — Assessment & Plan Note (Addendum)
Multiple small nodules originally incidental finding on CT of abdomen subsequently confirmed on CT chest. TB assay negative. Elevated ACE favors sarcoid diagnosis but atypical AFB could also look like this. She has been intolerant of steroid therapy because of marked mood changes so a trial of Plaquenil was initiated. She now questions whether her Plaquenil might be causing her to feel "lifeless" and easily tired. Plan-reassess with labs including update of ACE level, CT chest for comparison. Consider under what circumstances to recommend biopsy/bronchoscopy. Stop Plaquenil for observation to see if this changes how she feels systemically.

## 2016-08-07 NOTE — Progress Notes (Addendum)
09/05/2015-documentation note. CT scan had suggested slowly progressive sarcoid changes in chest. No biopsy. She was reluctant to take prednisone because of potential for mood changes. She has now successfully completed lumpectomy for benign nodule in L breast and is going to start Plaquenil 400 mg daily. ACE 06/02/13 76( 8-52)   Quant TB Gold 06/02/13- 0.2 neg CT chest 07/03/15-Upper lung zone predominant pattern appear perilymphatic nodularity, with irregular consolidation in the posterior segment right upper lobe. Findings appear somewhat progressive from prior exams. Sarcoid is favored.  08/07/16- 34 yoF never smoker followed for lung nodules, asthma/cough, sarcoid based on ACE-no bx FOLLOWS FOR: Pt is having SOB and wheezing as well. Has not had CXR or CT with labs in 1-2 years. Has been on Plaquenil 200 mg every morning for the past year for sarcoid. Marked  mood sensitivity to prednisone. She complains of feeling "lifeless"-comes and goes but more often than not.May be worse on work days, but has at home as well.  Sinus headache today. Increased dyspnea on exertion in the last few days also noted some times at rest. Rare use of rescue inhaler. Daily Breo 100 does help. She remains very sensitive to strong odors which trigger cough and chest tightness with anxiety component. Denies fever, occasional sweats (? Perimenopausal), occasional palpitation. Denies pain, rash, adenopathy. FENO 08/07/16- She was unable to perform- unable to maintain stable airflow  09/11/16-addendum-CT chest 07/2016 showed some improvement in small lung nodules, nothing worse. Still consistent with sarcoid. She had dropped off Plaquenil and will remain off. We planned PFT which so far has not been done.  Prior to Admission medications   Medication Sig Start Date End Date Taking? Authorizing Provider  albuterol (PROVENTIL HFA) 108 (90 BASE) MCG/ACT inhaler Inhale 2 puffs into the lungs every 4 (four) hours as needed.   Yes  [provider]  B Complex Vitamins (B-COMPLEX/B-12) TABS Take 1 tablet by mouth daily.   Yes [provider]  Biotin 10 MG TABS Take 1 tablet by mouth daily.   Yes [provider]  docusate sodium (COLACE) 100 MG capsule Take 200 mg by mouth daily.    Yes [provider]  fluticasone furoate-vilanterol (BREO ELLIPTA) 100-25 MCG/INH AEPB Inhale 1 puff into the lungs daily. 10/25/15  Yes [name] D, MD  hydroxychloroquine (PLAQUENIL) 200 MG tablet 1 twice daaily 09/07/15  Yes Io Dieujuste, Kasandra Knudsen, MD  levalbuterol (XOPENEX) 0.63 MG/3ML nebulizer solution Take 3 mLs (0.63 mg total) by nebulization every 6 (six) hours as needed for wheezing or shortness of breath. 09/07/15  Yes Tanda Morrissey, Tarri Fuller D, MD  LORazepam (ATIVAN) 0.5 MG tablet Take 0.5 mg by mouth every 8 (eight) hours.   Yes [provider]  Magnesium 400 MG CAPS Take by mouth.   Yes [provider]  Melatonin 3 MG CAPS Take by mouth.   Yes [provider]  Multiple Vitamin (MULTIVITAMIN WITH MINERALS) TABS Take 1 tablet by mouth daily.   Yes [provider]  Naproxen Sodium (ALEVE) 220 MG CAPS Take by mouth.   Yes [provider]  polyethylene glycol (MIRALAX / GLYCOLAX) packet Take 17 g by mouth 3 (three) times daily as needed. 10/17/12  Yes Virgel Manifold, MD  Probiotic Product (SOLUBLE FIBER/PROBIOTICS PO) Take 1 tablet by mouth daily.   Yes [provider]  pseudoephedrine (SUDAFED) 30 MG tablet Take 30 mg by mouth every 4 (four) hours as needed for congestion.   Yes [provider]   Past Medical  History:  Diagnosis Date  . Asthma   . Fibroids    uterine  . IBS (irritable bowel syndrome)   . Pulmonary sarcoidosis ([place])   . Shortness of breath dyspnea   soc ROS-see HPI    "+" = pos Constitutional:    weight loss, night sweats, fevers, chills, fatigue, lassitude. HEENT:    headaches, difficulty swallowing, tooth/dental problems, sore  throat,       sneezing, itching, ear ache, +nasal congestion, post nasal drip, snoring CV:    chest pain, orthopnea, PND, swelling in lower extremities, anasarca,                                                         dizziness, palpitations Resp:  + shortness of breath with exertion or at rest.                productive cough,  + non-productive cough, coughing up of blood.              change in color of mucus.  wheezing.   Skin:    rash or lesions. GI:  No-   heartburn, indigestion, abdominal pain, nausea, vomiting, diarrhea,                 change in bowel habits, loss of appetite GU: dysuria, change in color of urine, no urgency or frequency.   flank pain. MS:   joint pain, stiffness, decreased range of motion, back pain. Neuro-     nothing unusual Psych:  change in mood or affect.  depression or anxiety.   memory loss.  OBJ- Physical Exam General- Alert, Oriented, Affect-appropriate, Distress- none acute, + slender Skin- rash-none, lesions- none, excoriation- none Lymphadenopathy- none Head- atraumatic            Eyes- Gross vision intact, PERRLA, conjunctivae and secretions clear            Ears- Hearing, canals-normal            Nose- Clear, no-Septal dev, mucus, polyps, erosion, perforation             Throat- Mallampati II , mucosa clear , drainage- none, tonsils- atrophic Neck- flexible , trachea midline, no stridor , thyroid nl, carotid no bruit Chest - symmetrical excursion , unlabored           Heart/CV- RRR , no murmur , no gallop  , no rub, nl s1 s2                           - JVD- none , edema- none, stasis changes- none, varices- none           Lung- clear to P&A, wheeze- none, cough+ dry, dullness-none, rub- none           Chest wall-  Abd- No HSM Br/ Gen/ Rectal- Not done, not indicated Extrem- cyanosis- none, clubbing, none, atrophy- none, strength- nl Neuro- grossly intact to observation   Past Surgical History:  Procedure Laterality Date  . ABDOMINAL  HYSTERECTOMY N/A 10/13/2012   Procedure: HYSTERECTOMY ABDOMINAL;  Surgeon: Jonnie Kind, MD;  Location: [facility];  Service: Gynecology;  Laterality: N/A;  . BILATERAL SALPINGECTOMY Bilateral 10/13/2012   Procedure: BILATERAL SALPINGECTOMY;  Surgeon: Jonnie Kind, MD;  Location: [facility];  Service: Gynecology;  Laterality: Bilateral;  . BREAST LUMPECTOMY WITH RADIOACTIVE SEED LOCALIZATION Left 07/28/2015   Procedure: LEFT BREAST LUMPECTOMY WITH RADIOACTIVE SEED LOCALIZATION;  Surgeon: Autumn Messing III, MD;  Location: [facility];  Service: General;  Laterality: Left;  . WISDOM TOOTH EXTRACTION     Dr. Georgina Snell office-[facility], [place], [place]   Family History  Problem Relation Age of Onset  . Heart disease Mother     leaky valve  . Asthma Mother   . Hypertension Father     pacemaker  . Stroke Father   . Heart disease Father     pacemaker  . Fibromyalgia Sister   . Neuropathy Sister

## 2016-08-14 ENCOUNTER — Ambulatory Visit (INDEPENDENT_AMBULATORY_CARE_PROVIDER_SITE_OTHER)
Admission: RE | Admit: 2016-08-14 | Discharge: 2016-08-14 | Disposition: A | Discharge status: Home / Self Care | Payer: 59 | Source: Ambulatory Visit | Attending: Internal Medicine | Admitting: Internal Medicine

## 2016-08-14 DIAGNOSIS — R918 Other nonspecific abnormal finding of lung field: Secondary | ICD-10-CM | POA: Diagnosis not present

## 2016-08-14 DIAGNOSIS — R911 Solitary pulmonary nodule: Secondary | ICD-10-CM

## 2016-08-15 MED FILL — BREO ELLIPTA 100-25 MCG INH: 100-25 | 30 days supply | Qty: 60 | Fill #7

## 2016-10-10 MED FILL — BREO ELLIPTA 100-25 MCG INH: 100-25 | 30 days supply | Qty: 60 | Fill #8

## 2016-10-16 ENCOUNTER — Ambulatory Visit: Payer: 59 | Admitting: Internal Medicine

## 2016-10-23 ENCOUNTER — Ambulatory Visit (INDEPENDENT_AMBULATORY_CARE_PROVIDER_SITE_OTHER): Payer: 59 | Admitting: Internal Medicine

## 2016-10-23 ENCOUNTER — Encounter: Payer: Self-pay | Admitting: Internal Medicine

## 2016-10-23 DIAGNOSIS — D869 Sarcoidosis, unspecified: Secondary | ICD-10-CM

## 2016-10-23 DIAGNOSIS — J45991 Cough variant asthma: Secondary | ICD-10-CM

## 2016-10-23 LAB — PULMONARY FUNCTION TEST
DL/VA % PRED: 91 %
DL/VA: 4.5 ml/min/mmHg/L
DLCO COR % PRED: 88 %
DLCO cor: 22.72 ml/min/mmHg
DLCO unc % pred: 91 %
DLCO unc: 23.39 ml/min/mmHg
FEF 25-75 POST: 4 L/s
FEF 25-75 Pre: 3.72 L/sec
FEF2575-%CHANGE-POST: 7 %
FEF2575-%Pred-Post: 140 %
FEF2575-%Pred-Pre: 130 %
FEV1-%Change-Post: 1 %
FEV1-%Pred-Post: 108 %
FEV1-%Pred-Pre: 106 %
FEV1-Post: 3.16 L
FEV1-Pre: 3.11 L
FEV1FVC-%Change-Post: 1 %
FEV1FVC-%Pred-Pre: 104 %
FEV6-%Change-Post: 0 %
FEV6-%PRED-PRE: 104 %
FEV6-%Pred-Post: 103 %
FEV6-PRE: 3.73 L
FEV6-Post: 3.72 L
FEV6FVC-%Pred-Post: 103 %
FEV6FVC-%Pred-Pre: 103 %
FVC-%CHANGE-POST: 0 %
FVC-%PRED-POST: 101 %
FVC-%PRED-PRE: 101 %
FVC-POST: 3.72 L
FVC-PRE: 3.73 L
POST FEV1/FVC RATIO: 85 %
POST FEV6/FVC RATIO: 100 %
PRE FEV1/FVC RATIO: 84 %
Pre FEV6/FVC Ratio: 100 %
RV % pred: 121 %
RV: 2.22 L
TLC % PRED: 113 %
TLC: 5.88 L

## 2016-10-23 NOTE — Progress Notes (Signed)
PFT done today. 

## 2016-10-23 NOTE — Assessment & Plan Note (Signed)
We discussed seasonal/episodic use of maintenance controller and as needed use of rescue inhaler when appropriate. Okay to remain off meds for now.

## 2016-10-23 NOTE — Progress Notes (Signed)
HPI F never smoker followed for lung nodules, asthma/cough, sarcoid based on ACE-no bx ACE 06/02/13 76( 8-52)   Quant TB Gold 06/02/13- 0.2 neg FENO 08/07/16- She was unable to perform- unable to maintain stable airflow PFT 10/23/16-WNL ACE 08/07/16- 81 H (9-67)  BMET, LFTs and CBC were all normal. CT chest 07/2016 showed some improvement in small lung nodules, nothing worse. Still consistent with sarcoid. She had dropped off Plaquenil and will remain off  ----------------------------------------------------------------------------------------  09/05/2015-documentation note. CT scan had suggested slowly progressive sarcoid changes in chest. No biopsy. She was reluctant to take prednisone because of potential for mood changes. She has now successfully completed lumpectomy for benign nodule in L breast and is going to start Plaquenil 400 mg daily. ACE 06/02/13 76( 8-52)   Quant TB Gold 06/02/13- 0.2 neg CT chest 07/03/15-Upper lung zone predominant pattern appear perilymphatic nodularity, with irregular consolidation in the posterior segment right upper lobe. Findings appear somewhat progressive from prior exams. Sarcoid is favored.  08/07/16- 87 yoF never smoker followed for lung nodules, asthma/cough, sarcoid based on ACE-no bx FOLLOWS FOR: Pt is having SOB and wheezing as well. Has not had CXR or CT with labs in 1-2 years. Has been on Plaquenil 200 mg every morning for the past year for sarcoid. Marked  mood sensitivity to prednisone. She complains of feeling "lifeless"-comes and goes but more often than not.May be worse on work days, but has at home as well.  Sinus headache today. Increased dyspnea on exertion in the last few days also noted some times at rest. Rare use of rescue inhaler. Daily Breo 100 does help. She remains very sensitive to strong odors which trigger cough and chest tightness with anxiety component. Denies fever, occasional sweats (? Perimenopausal), occasional palpitation. Denies pain,  rash, adenopathy. FENO 08/07/16- She was unable to perform- unable to maintain stable airflow  09/11/16-addendum-CT chest 07/2016 showed some improvement in small lung nodules, nothing worse. Still consistent with sarcoid. She had dropped off Plaquenil and will remain off. We planned PFT which so far has not been done.  10/23/16- 51 yoF never smoker followed for lung nodules, asthma/cough, sarcoid based on ACE-no bx Pt states her breathing has been well; occassional dry cough that leads to chest tightness; denies chest tightness. Had 1 ashtma flare since last ov. Had PFT and CT performed  Has not been needing rescue or maintenance inhalers and denies wheeze or sleep disturbance from breathing. Occasional coughing episode especially if hot or stressed can become paroxysmal. Denies adenopathy, rash, night sweats concerning for sarcoid. ACE 08/07/16- 81 H (9-67)  BMET, LFTs and CBC were all normal. PFT 10/23/16- WNL  ROS-see HPI    "+" = pos Constitutional:    weight loss, night sweats, fevers, chills, fatigue, lassitude. HEENT:    headaches, difficulty swallowing, tooth/dental problems, sore throat,       sneezing, itching, ear ache, +nasal congestion, post nasal drip, snoring CV:    chest pain, orthopnea, PND, swelling in lower extremities, anasarca,                                                         dizziness, palpitations Resp:  + shortness of breath with exertion or at rest.                productive  cough,  + non-productive cough, coughing up of blood.              change in color of mucus.  wheezing.   Skin:    rash or lesions. GI:  No-   heartburn, indigestion, abdominal pain, nausea, vomiting, diarrhea,                 change in bowel habits, loss of appetite GU: dysuria, change in color of urine, no urgency or frequency.   flank pain. MS:   joint pain, stiffness, decreased range of motion, back pain. Neuro-     nothing unusual Psych:  change in mood or affect.  depression or anxiety.    memory loss.  OBJ- Physical Exam General- Alert, Oriented, Affect-appropriate, Distress- none acute, + slender Skin- rash-none, lesions- none, excoriation- none Lymphadenopathy- none Head- atraumatic            Eyes- Gross vision intact, PERRLA, conjunctivae and secretions clear            Ears- Hearing, canals-normal            Nose- Clear, no-Septal dev, mucus, polyps, erosion, perforation             Throat- Mallampati II , mucosa clear , drainage- none, tonsils- atrophic Neck- flexible , trachea midline, no stridor , thyroid nl, carotid no bruit Chest - symmetrical excursion , unlabored           Heart/CV- RRR , no murmur , no gallop  , no rub, nl s1 s2                           - JVD- none , edema- none, stasis changes- none, varices- none           Lung- clear to P&A, wheeze- none, cough+ dry, dullness-none, rub- none           Chest wall-  Abd- No HSM Br/ Gen/ Rectal- Not done, not indicated Extrem- cyanosis- none, clubbing, none, atrophy- none, strength- nl Neuro- grossly intact to observation

## 2016-10-23 NOTE — Patient Instructions (Addendum)
We can continue to follow the sarcoid issue but let us know if nodes, fever/ night sweats, rash.  Let us know when you need asthma inhalers refilled

## 2016-10-23 NOTE — Assessment & Plan Note (Signed)
CT May/2018 showed some improvement in small lung nodules with nothing progressive, still consistent with sarcoid. Plan-off of therapy. ACE remains elevated but with no other indication of activity. She is reluctant to try prednisone because of mood effects. Anticipate repeat CT probably in 2019.

## 2017-02-26 ENCOUNTER — Telehealth: Payer: Self-pay | Admitting: Internal Medicine

## 2017-02-26 MED ORDER — PREDNISONE 10 MG PO TABS: 10.0000 mg | ORAL_TABLET | Freq: Every day | ORAL | 0 refills | Status: DC

## 2017-02-26 MED FILL — predniSONE 10 MG TABS: 10 | 20 days supply | Qty: 20 | Fill #0

## 2017-02-26 NOTE — Telephone Encounter (Signed)
Pruritic rash volar forearms x 1-2 weeks. Possible neurodermatitis. Not relieved by topical oint. P- prednisone to [facility] pharm

## 2017-04-25 ENCOUNTER — Other Ambulatory Visit: Payer: 59

## 2017-04-25 ENCOUNTER — Ambulatory Visit (INDEPENDENT_AMBULATORY_CARE_PROVIDER_SITE_OTHER): Payer: 59 | Admitting: Internal Medicine

## 2017-04-25 ENCOUNTER — Encounter: Payer: Self-pay | Admitting: Internal Medicine

## 2017-04-25 VITALS — BP 108/80 | HR 82 | Ht 65.0 in | Wt 121.0 lb

## 2017-04-25 DIAGNOSIS — R21 Rash and other nonspecific skin eruption: Secondary | ICD-10-CM | POA: Insufficient documentation

## 2017-04-25 DIAGNOSIS — D869 Sarcoidosis, unspecified: Secondary | ICD-10-CM

## 2017-04-25 DIAGNOSIS — J45991 Cough variant asthma: Secondary | ICD-10-CM

## 2017-04-25 MED ORDER — ALBUTEROL SULFATE HFA 108 (90 BASE) MCG/ACT IN AERS: 2.0000 | INHALATION_SPRAY | Freq: Four times a day (QID) | RESPIRATORY_TRACT | 12 refills | Status: DC | PRN

## 2017-04-25 MED FILL — VENTOLIN HFA 90 MCG INHALER: 108 (90 BAS | 25 days supply | Qty: 18 | Fill #0

## 2017-04-25 NOTE — Assessment & Plan Note (Signed)
Clinically stable.  We will follow with occasional CXR.  Rest of systemic progression is considered unlikely. Plan-ACE level

## 2017-04-25 NOTE — Assessment & Plan Note (Signed)
Occasional paroxysms of dry cough, stable pattern.  Often triggered by stress or environmental odors and relieved by a nebulizer treatment.  No change in pattern or frequency.  She has not felt benefit from a maintenance controller like Breo.

## 2017-04-25 NOTE — Assessment & Plan Note (Signed)
Dry excoriation on volar forearms.  Pattern favors neurodermatitis.  She is reluctant to take prednisone because of mood changes.  Topical therapy is appropriate if needed.

## 2017-04-25 NOTE — Progress Notes (Signed)
HPI F never smoker followed for lung nodules, asthma/cough, sarcoid based on ACE-no bx ACE 06/02/13 78( 8-52)   Quant TB Gold 06/02/13- 0.2 neg FENO 08/07/16- She was unable to perform- unable to maintain stable airflow PFT 10/23/16-WNL ACE 08/07/16- 81 H (9-67)  BMET, LFTs and CBC were all normal. CT chest 07/2016 showed some improvement in small lung nodules, nothing worse. Still consistent with sarcoid. She had dropped off Plaquenil and will remain off  ---------------------------------------------------------------------------------------  10/23/16- 51 yoF never smoker followed for lung nodules, asthma/cough, sarcoid based on ACE-no bx Pt states her breathing has been well; occassional dry cough that leads to chest tightness; denies chest tightness. Had 1 ashtma flare since last ov. Had PFT and CT performed  Has not been needing rescue or maintenance inhalers and denies wheeze or sleep disturbance from breathing. Occasional coughing episode especially if hot or stressed can become paroxysmal. Denies adenopathy, rash, night sweats concerning for sarcoid. ACE 08/07/16- 81 H (9-67)  BMET, LFTs and CBC were all normal. PFT 10/23/16- WNL  04/25/17- 51 yoF never smoker followed for lung nodules, asthma/cough, sarcoid based on ACE-no bx, rash ----Pt doing well overall  xopenex neb,  albuterol hfa Little change in tendency to cough paroxysms with shortness of breath triggered by environmental irritants, strong odors and probably anxiety.  Uses albuterol occasionally as needed, rarely uses nebulizer.  Has not needed maintenance therapy Breo. Has had some episodes of incidental bilateral dry pruritic rash on volar forearms consistent with neurodermatitis.  I gave her low-dose prednisone to try for this but she is not using it. Denies adenopathy, fever, sweats.   ROS-see HPI    "+" = pos Constitutional:    weight loss, night sweats, fevers, chills, fatigue, lassitude. HEENT:    headaches, difficulty swallowing,  tooth/dental problems, sore throat,       sneezing, itching, ear ache, +nasal congestion, post nasal drip, snoring CV:    chest pain, orthopnea, PND, swelling in lower extremities, anasarca,                                                         dizziness, palpitations Resp:  + shortness of breath with exertion or at rest.                productive cough,  + non-productive cough, coughing up of blood.              change in color of mucus.  wheezing.   Skin:    +rash or lesions. GI:  No-   heartburn, indigestion, abdominal pain, nausea, vomiting, diarrhea,                 change in bowel habits, loss of appetite GU: dysuria, change in color of urine, no urgency or frequency.   flank pain. MS:   joint pain, stiffness, decreased range of motion, back pain. Neuro-     nothing unusual Psych:  change in mood or affect.  depression or anxiety.   memory loss.  OBJ- Physical Exam General- Alert, Oriented, Affect-appropriate, Distress- none acute, + slender Skin- rash-none, lesions- none, excoriation- none Lymphadenopathy- none Head- atraumatic            Eyes- Gross vision intact, PERRLA, conjunctivae and secretions clear  Ears- Hearing, canals-normal            Nose- Clear, no-Septal dev, mucus, polyps, erosion, perforation             Throat- Mallampati II , mucosa clear , drainage- none, tonsils- atrophic Neck- flexible , trachea midline, no stridor , thyroid nl, carotid no bruit Chest - symmetrical excursion , unlabored           Heart/CV- RRR , no murmur , no gallop  , no rub, nl s1 s2                           - JVD- none , edema- none, stasis changes- none, varices- none           Lung- clear to P&A, wheeze- none, cough+ dry, dullness-none, rub- none           Chest wall-  Abd- No HSM Br/ Gen/ Rectal- Not done, not indicated Extrem- cyanosis- none, clubbing, none, atrophy- none, strength- nl Neuro- grossly intact to observation

## 2017-04-25 NOTE — Patient Instructions (Addendum)
Order- lab- ACE lkevel    Dx sarcoid  Script sent refilling albuterol rescue inhaler

## 2017-04-26 LAB — ANGIOTENSIN CONVERTING ENZYME: Angiotensin-Converting Enzyme: 100 U/L — ABNORMAL HIGH (ref 9–67)

## 2017-06-03 DIAGNOSIS — H524 Presbyopia: Secondary | ICD-10-CM | POA: Diagnosis not present

## 2017-06-12 ENCOUNTER — Other Ambulatory Visit: Payer: Self-pay | Admitting: Internal Medicine

## 2017-06-12 MED ORDER — LORAZEPAM 0.5 MG PO TABS: 0.5000 mg | ORAL_TABLET | Freq: Three times a day (TID) | ORAL | 5 refills | Status: DC

## 2017-06-12 MED FILL — LORazepam 0.5 MG TABS: 0.5 | 7 days supply | Qty: 30 | Fill #0

## 2017-06-12 NOTE — Telephone Encounter (Signed)
Per CY-okay to refill x 5. Called to pharmacy.

## 2017-06-18 DIAGNOSIS — F321 Major depressive disorder, single episode, moderate: Secondary | ICD-10-CM | POA: Diagnosis not present

## 2017-06-18 MED FILL — CITALOPRAM HBR 20 MG TABLET: 20 | 30 days supply | Qty: 30 | Fill #0

## 2017-07-02 ENCOUNTER — Other Ambulatory Visit: Payer: Self-pay | Admitting: Internal Medicine

## 2017-07-02 DIAGNOSIS — K219 Gastro-esophageal reflux disease without esophagitis: Secondary | ICD-10-CM | POA: Diagnosis not present

## 2017-07-02 DIAGNOSIS — Z139 Encounter for screening, unspecified: Secondary | ICD-10-CM

## 2017-07-02 DIAGNOSIS — F321 Major depressive disorder, single episode, moderate: Secondary | ICD-10-CM | POA: Diagnosis not present

## 2017-07-02 DIAGNOSIS — D860 Sarcoidosis of lung: Secondary | ICD-10-CM | POA: Diagnosis not present

## 2017-07-02 DIAGNOSIS — Z0000 Encounter for general adult medical examination without abnormal findings: Secondary | ICD-10-CM | POA: Diagnosis not present

## 2017-07-02 DIAGNOSIS — J4520 Mild intermittent asthma, uncomplicated: Secondary | ICD-10-CM | POA: Diagnosis not present

## 2017-07-02 DIAGNOSIS — K589 Irritable bowel syndrome without diarrhea: Secondary | ICD-10-CM | POA: Diagnosis not present

## 2017-07-16 MED FILL — SERTRALINE HCL 100 MG TAB: 100 | 90 days supply | Qty: 90 | Fill #0

## 2017-07-23 ENCOUNTER — Ambulatory Visit: Payer: 59

## 2017-08-06 ENCOUNTER — Other Ambulatory Visit: Payer: Self-pay | Admitting: Internal Medicine

## 2017-08-07 MED FILL — AZITHROMYCIN 250 MG TABS: 250 | 5 days supply | Qty: 6 | Fill #0

## 2017-09-02 DIAGNOSIS — F331 Major depressive disorder, recurrent, moderate: Secondary | ICD-10-CM | POA: Diagnosis not present

## 2017-09-02 DIAGNOSIS — F419 Anxiety disorder, unspecified: Secondary | ICD-10-CM | POA: Diagnosis not present

## 2017-09-02 MED FILL — HYDROXYZINE PAM 25 MG CAP: 25 | 30 days supply | Qty: 30 | Fill #0

## 2017-09-03 ENCOUNTER — Ambulatory Visit: Payer: 59

## 2017-09-09 MED FILL — SERTRALINE HCL 100 MG TAB: 100 | 30 days supply | Qty: 45 | Fill #0

## 2017-09-23 DIAGNOSIS — F902 Attention-deficit hyperactivity disorder, combined type: Secondary | ICD-10-CM | POA: Diagnosis not present

## 2017-09-23 DIAGNOSIS — F419 Anxiety disorder, unspecified: Secondary | ICD-10-CM | POA: Diagnosis not present

## 2017-09-23 DIAGNOSIS — F331 Major depressive disorder, recurrent, moderate: Secondary | ICD-10-CM | POA: Diagnosis not present

## 2017-09-23 MED FILL — METHYLPHENIDATE 10 MG TAB: 10 | 30 days supply | Qty: 60 | Fill #0

## 2017-09-30 ENCOUNTER — Ambulatory Visit: Payer: 59 | Admitting: Internal Medicine

## 2017-09-30 DIAGNOSIS — F331 Major depressive disorder, recurrent, moderate: Secondary | ICD-10-CM | POA: Diagnosis not present

## 2017-09-30 DIAGNOSIS — F902 Attention-deficit hyperactivity disorder, combined type: Secondary | ICD-10-CM | POA: Diagnosis not present

## 2017-09-30 DIAGNOSIS — F419 Anxiety disorder, unspecified: Secondary | ICD-10-CM | POA: Diagnosis not present

## 2017-10-15 MED FILL — hydrOXYzine HCL 25 MG TABS: 25 | 30 days supply | Qty: 30 | Fill #0

## 2017-11-04 ENCOUNTER — Ambulatory Visit
Admission: RE | Admit: 2017-11-04 | Discharge: 2017-11-04 | Disposition: A | Discharge status: Home / Self Care | Payer: 59 | Source: Ambulatory Visit | Attending: Internal Medicine | Admitting: Internal Medicine

## 2017-11-04 DIAGNOSIS — Z139 Encounter for screening, unspecified: Secondary | ICD-10-CM

## 2017-11-04 DIAGNOSIS — Z1231 Encounter for screening mammogram for malignant neoplasm of breast: Secondary | ICD-10-CM | POA: Diagnosis not present

## 2017-11-05 DIAGNOSIS — F331 Major depressive disorder, recurrent, moderate: Secondary | ICD-10-CM | POA: Diagnosis not present

## 2017-11-05 DIAGNOSIS — F419 Anxiety disorder, unspecified: Secondary | ICD-10-CM | POA: Diagnosis not present

## 2017-11-05 DIAGNOSIS — F902 Attention-deficit hyperactivity disorder, combined type: Secondary | ICD-10-CM | POA: Diagnosis not present

## 2017-11-05 MED FILL — SERTRALINE HCL 100 MG TAB: 100 | 30 days supply | Qty: 45 | Fill #0

## 2017-11-24 MED FILL — hydrOXYzine HCL 25 MG TABS: 25 | 30 days supply | Qty: 30 | Fill #0

## 2017-11-26 MED FILL — QUILLICHEW ER 30 MG CHER: 30 | 30 days supply | Qty: 30 | Fill #0

## 2017-11-28 ENCOUNTER — Telehealth: Payer: Self-pay | Admitting: Pulmonary Disease

## 2017-11-28 MED ORDER — METHYLPREDNISOLONE 4 MG PO TBPK: ORAL_TABLET | ORAL | 0 refills | Status: DC

## 2017-11-28 MED ORDER — AZITHROMYCIN 250 MG PO TABS: ORAL_TABLET | ORAL | 0 refills | Status: AC

## 2017-11-28 MED FILL — METHYLPREDNISOLONE 4 MG TAB: 4 | 6 days supply | Qty: 21 | Fill #0

## 2017-11-28 MED FILL — AZITHROMYCIN 250 MG TABLET: 250 | 5 days supply | Qty: 6 | Fill #0

## 2017-11-28 NOTE — Telephone Encounter (Signed)
Pt of DrYoung's and [employer] employee...    He follows for hx prob sarcoidosis & asthma, chart reviewed-- uses Xopenex via [facility] & ProventilHFA as needed...    Asked to check pt w/ 3d hx cough, congestion, chills, but no fever; she has been blowing some clear mucus but cough is dry; denies SOB but had some chest tightness requiring Neb treatment this AM; she is aching & has sl sore throat, concern for weekend...     Exam shows Afeb, T=98.2, throat- neg, no adenopathy, clear lungs x dry cough in paroxysms...    Discussed Rx w/ ZPak & Medrol dosepak for now & she will f/u w/ DrYoung next week.Marland KitchenMarland Kitchen

## 2017-11-28 NOTE — Telephone Encounter (Signed)
Per SN >> Zpack and Medrol Dose Pak.  Rx's have been sent in.

## 2017-11-28 NOTE — Telephone Encounter (Signed)
Spoke with pt. States that she is not feeling well. Reports sinus headache, cough, SOB and hoarseness. Having chills but she not running a fever. Symptoms started 3 days ago. Has been taking Sudafed PE, Aleve and Advil Cold and Sinus with minimal relief. Took a breathing treatment this morning with slight relief. Checked the pt's vitals signs >> BP: 110/68, P: 87, O2: 97% RA.  SN - please advise. Thanks.

## 2017-12-02 ENCOUNTER — Telehealth: Payer: Self-pay | Admitting: Internal Medicine

## 2017-12-02 DIAGNOSIS — F902 Attention-deficit hyperactivity disorder, combined type: Secondary | ICD-10-CM | POA: Diagnosis not present

## 2017-12-02 DIAGNOSIS — F331 Major depressive disorder, recurrent, moderate: Secondary | ICD-10-CM | POA: Diagnosis not present

## 2017-12-02 DIAGNOSIS — F419 Anxiety disorder, unspecified: Secondary | ICD-10-CM | POA: Diagnosis not present

## 2017-12-02 MED ORDER — PROMETHAZINE-CODEINE 6.25-10 MG/5ML PO SYRP: ORAL_SOLUTION | ORAL | 0 refills | Status: DC

## 2017-12-02 MED FILL — PROMETHAZINE W/COD SYRUP: 6.25-10 | 10 days supply | Qty: 200 | Fill #0

## 2017-12-02 NOTE — Telephone Encounter (Signed)
Saw Dr Lenna Gilford last week for acute asthmatic bronchitis. Not much improved after prednisone and abx. Sputum not purulent. Plan- neb xop 0.63,    Printed script prometh codeine cough syrup

## 2017-12-09 ENCOUNTER — Telehealth: Payer: Self-pay | Admitting: Internal Medicine

## 2017-12-09 MED ORDER — UMECLIDINIUM-VILANTEROL 62.5-25 MCG/INH IN AEPB
1.0000 | INHALATION_SPRAY | Freq: Every day | RESPIRATORY_TRACT | 0 refills | Status: DC
Start: 2017-12-09 — End: 2018-04-29

## 2017-12-09 NOTE — Telephone Encounter (Signed)
Describes persistent sore throat and some chest congestion, without obvious infection. Plan- throat lozenges and sips for throat. Try sample Anoro inhaler- 1 puff daily

## 2017-12-10 DIAGNOSIS — F331 Major depressive disorder, recurrent, moderate: Secondary | ICD-10-CM | POA: Diagnosis not present

## 2017-12-10 DIAGNOSIS — F902 Attention-deficit hyperactivity disorder, combined type: Secondary | ICD-10-CM | POA: Diagnosis not present

## 2017-12-10 DIAGNOSIS — F419 Anxiety disorder, unspecified: Secondary | ICD-10-CM | POA: Diagnosis not present

## 2017-12-10 MED FILL — SERTRALINE HCL 100 MG TAB: 100 | 30 days supply | Qty: 45 | Fill #1

## 2018-01-06 MED FILL — hydrOXYzine HCL 25 MG TABS: 25 | 30 days supply | Qty: 30 | Fill #1

## 2018-01-12 MED FILL — QUILLICHEW ER 30 MG CHER: 30 | 30 days supply | Qty: 30 | Fill #0

## 2018-01-13 MED FILL — SERTRALINE HCL 100 MG TAB: 100 | 30 days supply | Qty: 45 | Fill #2

## 2018-02-13 MED FILL — SERTRALINE HCL 100 MG TAB: 100 | 30 days supply | Qty: 45 | Fill #0

## 2018-03-20 DIAGNOSIS — R232 Flushing: Secondary | ICD-10-CM | POA: Diagnosis not present

## 2018-03-20 MED FILL — ESTRADIOL 0.5 MG TABLET: 0.5 | 30 days supply | Qty: 30 | Fill #0

## 2018-03-23 MED FILL — SERTRALINE HCL 100 MG TAB: 100 | 30 days supply | Qty: 45 | Fill #1

## 2018-04-08 ENCOUNTER — Other Ambulatory Visit: Payer: Self-pay | Admitting: Internal Medicine

## 2018-04-08 DIAGNOSIS — D869 Sarcoidosis, unspecified: Secondary | ICD-10-CM

## 2018-04-21 ENCOUNTER — Ambulatory Visit
Admission: RE | Admit: 2018-04-21 | Discharge: 2018-04-21 | Disposition: A | Discharge status: Home / Self Care | Payer: 59 | Source: Ambulatory Visit | Attending: Internal Medicine | Admitting: Internal Medicine

## 2018-04-21 DIAGNOSIS — D869 Sarcoidosis, unspecified: Secondary | ICD-10-CM

## 2018-04-27 MED FILL — SERTRALINE HCL 100 MG TAB: 100 | 30 days supply | Qty: 45 | Fill #2

## 2018-04-29 ENCOUNTER — Ambulatory Visit (HOSPITAL_COMMUNITY): Payer: Self-pay | Admitting: Psychiatry

## 2018-04-29 ENCOUNTER — Encounter (HOSPITAL_COMMUNITY): Payer: Self-pay | Admitting: Psychiatry

## 2018-04-29 ENCOUNTER — Ambulatory Visit (INDEPENDENT_AMBULATORY_CARE_PROVIDER_SITE_OTHER): Payer: 59 | Admitting: Psychiatry

## 2018-04-29 VITALS — BP 122/66 | Ht 65.0 in | Wt 121.4 lb

## 2018-04-29 DIAGNOSIS — F321 Major depressive disorder, single episode, moderate: Secondary | ICD-10-CM | POA: Insufficient documentation

## 2018-04-29 DIAGNOSIS — F988 Other specified behavioral and emotional disorders with onset usually occurring in childhood and adolescence: Secondary | ICD-10-CM | POA: Diagnosis not present

## 2018-04-29 DIAGNOSIS — F331 Major depressive disorder, recurrent, moderate: Secondary | ICD-10-CM

## 2018-04-29 DIAGNOSIS — F411 Generalized anxiety disorder: Secondary | ICD-10-CM | POA: Diagnosis not present

## 2018-04-29 MED ORDER — SERTRALINE HCL 100 MG PO TABS: 200.0000 mg | ORAL_TABLET | Freq: Every day | ORAL | 0 refills | Status: DC

## 2018-04-29 MED ORDER — AMPHETAMINE-DEXTROAMPHET ER 20 MG PO CP24: 20.0000 mg | ORAL_CAPSULE | Freq: Every day | ORAL | 0 refills | Status: DC

## 2018-04-29 MED FILL — ADDERALL XR 20 MG CAP SA: 20 | 30 days supply | Qty: 30 | Fill #0

## 2018-04-29 NOTE — Patient Instructions (Signed)
Plan:  1. Please start taking two tablets of sertraline daily for a total of 200 mg daily.  2. Continue to use lorazepam as needed for anxiety and hydroxyzine as needed for sleep.  3. Start a trial of Adderall XR 20 mg - take one capsule in AM with breakfast.

## 2018-04-29 NOTE — Progress Notes (Signed)
Psychiatric Initial Adult Assessment   Patient Identification: Kathleen Key MRN:  [id number] Date of Evaluation:  04/29/2018 Referral Source: self Chief Complaint:  Depression, difficulty focusing, completing tasks. Visit Diagnosis:    ICD-10-CM   1. Moderate episode of recurrent major depressive disorder (HCC) F33.1   2. GAD (generalized anxiety disorder) F41.1   3. ADD (attention deficit disorder) without hyperactivity F98.8     History of Present Illness:  53 yo married female with long (since her 72s) hx of depression and anxiety (generalized type). She has been prescribed antidepressant medications (fluoxetive, venlafaxine)and has been now on sertraline for close to a year. She has a hx of having suicidal thoughts but denies ever attempting suicide. She has never been psychiatrically hospitalized. Kathleen Key additionally has been struggling with problems with focusing, task completion, daydreaming, distractibility since childhood. She was not formally diagnosed in the past but finally she was started on a long acting methylphenidate medication Gurney Maxin) recently. Unclear benefit while taking it for 2 months and it was very expensive. She stopped taking it two months ago and cannot recall what was the dose she was on. Problems with focusing has affected her work greatly and she reports that she was close to losing her job not that long ago.   Patient does not have a hx of mania, psychosis. She does nopt abuse alcohol or street drugs. She is prescribed low 0.5 mg dose of lorazepam for anxiety and takes it very sparingly.  Associated Signs/Symptoms: Depression Symptoms:  depressed mood, insomnia, fatigue, feelings of worthlessness/guilt, difficulty concentrating, impaired memory, anxiety, (Hypo) Manic Symptoms:  Distractibility, Anxiety Symptoms:  Excessive Worry, Psychotic Symptoms:  None PTSD Symptoms: Negative  Past Psychiatric History: see above  Previous Psychotropic  Medications: Yes   Substance Abuse History in the last 12 months:  No.  Consequences of Substance Abuse: NA  Past Medical History:  Past Medical History:  Diagnosis Date  . Asthma   . Fibroids    uterine  . IBS (irritable bowel syndrome)   . Pulmonary sarcoidosis ([place])   . Shortness of breath dyspnea     Past Surgical History:  Procedure Laterality Date  . ABDOMINAL HYSTERECTOMY N/A 10/13/2012   Procedure: HYSTERECTOMY ABDOMINAL;  Surgeon: Jonnie Kind, MD;  Location: [facility];  Service: Gynecology;  Laterality: N/A;  . BILATERAL SALPINGECTOMY Bilateral 10/13/2012   Procedure: BILATERAL SALPINGECTOMY;  Surgeon: Jonnie Kind, MD;  Location: [facility];  Service: Gynecology;  Laterality: Bilateral;  . BREAST LUMPECTOMY WITH RADIOACTIVE SEED LOCALIZATION Left 07/28/2015   Procedure: LEFT BREAST LUMPECTOMY WITH RADIOACTIVE SEED LOCALIZATION;  Surgeon: Autumn Messing III, MD;  Location: [facility];  Service: General;  Laterality: Left;  . WISDOM TOOTH EXTRACTION     Dr. Georgina Snell office-[facility], [place], [place]    Family Psychiatric History: reviewed; additionally maternal nephew and nice have ADHD.  Family History:  Family History  Problem Relation Age of Onset  . Heart disease Mother        leaky valve  . Asthma Mother   . Hypertension Father        pacemaker  . Stroke Father   . Heart disease Father        pacemaker  . Fibromyalgia Sister   . Neuropathy Sister     Social History:   Social History   Socioeconomic History  . Marital status: Married    Spouse name: Not on file  . Number of children: Not on file  . Years of education:  Not on file  . Highest education level: Not on file  Occupational History  . Not on file  Social Needs  . Financial resource strain: Not on file  . Food insecurity:    Worry: Not on file    Inability: Not on file  . Transportation needs:    Medical: Not on file    Non-medical: Not on file  Tobacco Use  .  Smoking status: Never Smoker  . Smokeless tobacco: Never Used  Substance and Sexual Activity  . Alcohol use: Yes    Comment: occassional  . Drug use: No  . Sexual activity: Yes    Birth control/protection: Surgical  Lifestyle  . Physical activity:    Days per week: Not on file    Minutes per session: Not on file  . Stress: Not on file  Relationships  . Social connections:    Talks on phone: Not on file    Gets together: Not on file    Attends religious service: Not on file    Active member of club or organization: Not on file    Attends meetings of clubs or organizations: Not on file    Relationship status: Not on file  Other Topics Concern  . Not on file  Social History Narrative  . Not on file    Additional Social History: Father passed away 3 years ago  Allergies:   Allergies  Allergen Reactions  . Dilaudid [Hydromorphone Hcl] Itching  . Percocet [Oxycodone-Acetaminophen] Itching  . Excedrin Back & [Acetaminophen-Aspirin Buffered] Other (See Comments)    SUGAR-SPIKE, NERVOUSNESS    Metabolic Disorder Labs: No results found for: HGBA1C, MPG No results found for: PROLACTIN No results found for: CHOL, TRIG, HDL, CHOLHDL, VLDL, LDLCALC No results found for: TSH  Therapeutic Level Labs: No results found for: LITHIUM No results found for: CBMZ No results found for: VALPROATE  Current Medications: Current Outpatient Medications  Medication Sig Dispense Refill  . B Complex Vitamins (B-COMPLEX/B-12) TABS Take 1 tablet by mouth daily.    [name] docusate sodium (COLACE) 100 MG capsule Take 200 mg by mouth daily.     [name] levalbuterol (XOPENEX) 0.63 MG/3ML nebulizer solution Take 3 mLs (0.63 mg total) by nebulization every 6 (six) hours as needed for wheezing or shortness of breath. 75 mL 12  . LORazepam (ATIVAN) 0.5 MG tablet Take 1 tablet (0.5 mg total) by mouth every 8 (eight) hours. 30 tablet 5  . Magnesium 400 MG CAPS Take by mouth.    . Melatonin 5 MG CAPS Take by mouth.     . Multiple Vitamin (MULTIVITAMIN WITH MINERALS) TABS Take 1 tablet by mouth daily.    . polyethylene glycol (MIRALAX / GLYCOLAX) packet Take 17 g by mouth 3 (three) times daily as needed. 14 each 0  . pseudoephedrine (SUDAFED) 30 MG tablet Take 30 mg by mouth every 4 (four) hours as needed for congestion.    . sertraline (ZOLOFT) 100 MG tablet Take 2 tablets (200 mg total) by mouth daily for 30 days. 60 tablet 0  . amphetamine-dextroamphetamine (ADDERALL XR) 20 MG 24 hr capsule Take 1 capsule (20 mg total) by mouth daily for 30 days. 30 capsule 0  . estradiol (ESTRACE) 0.5 MG tablet      No current facility-administered medications for this visit.     Musculoskeletal: Strength & Muscle Tone: within normal limits Gait & Station: normal Patient leans: N/A  Psychiatric Specialty Exam: Review of Systems  Constitutional: Negative.   HENT: Negative.  Eyes: Negative.   Respiratory: Negative.   Cardiovascular: Negative.   Gastrointestinal: Negative.   Genitourinary: Negative.   Musculoskeletal: Negative.   Skin: Negative.   Neurological: Negative.   Endo/Heme/Allergies: Negative.   Psychiatric/Behavioral: Positive for depression. The patient is nervous/anxious.     Blood pressure 122/66, height 5\' 5"  (1.651 m), weight 121 lb 6.4 oz (55.1 kg), last menstrual period 10/08/2012.Body mass index is 20.2 kg/m.  General Appearance: Casual and Fairly Groomed  Eye Contact:  Fair  Speech:  Negative  Volume:  Decreased  Mood:  Depressed  Affect:  Congruent and Constricted  Thought Process:  Coherent and Goal Directed  Orientation:  Full (Time, Place, and Person)  Thought Content:  Logical  Suicidal Thoughts:  No  Homicidal Thoughts:  No  Memory:  Immediate;   Fair Recent;   Fair Remote;   Good  Judgement:  Good  Insight:  Fair  Psychomotor Activity:  Normal  Concentration:  Concentration: Fair and Attention Span: Fair  Recall:  [employer] of Knowledge:Good  Language: Good   Akathisia:  Negative  Handed:  Right  AIMS (if indicated):  not done  Assets:  Desire for Improvement Housing Physical Health Social Support Vocational/Educational  ADL's:  Intact  Cognition: WNL  Sleep:  Fair   Screenings:   Assessment and Plan: 53 yo married female with hx of major depression, generalized anxiety and ADD who has been increasingly struggling with task completion, focusing triggering depression which in the past was deep enough to result in suicidal thoughts. She has been benefiting from sertraline but minimally from a trial of methylphenidate.  She would like to try a different medication for ADD and one which she can afford. She is contracting for safety at this time. She takes hydroxyzine for insomnia with decent response.   Plan: We will increase sertraline dose to 200 mg daily. She will continue to take lorazepam as needed for anxiety and hydroxyzine as needed for sleep. We will try medium dose of Adderall XR 20 mg daily. Kathleen Key will complete [facility]  Adult ADHD questionnaire for me and bring it to her next appointment in 4 weeks. We may need to adjust dose of her new stimulant depending on response/tolerability. The plan was discussed with patient. I spend 60 minutes in direct face to face clinical contact with the patient and devoted approximately 50% of this time to explanation of diagnosis, discussion of treatment options and med education.   Stephanie Acre, MD 1/29/20203:07 PM

## 2018-05-14 ENCOUNTER — Telehealth: Payer: Self-pay | Admitting: Internal Medicine

## 2018-05-14 ENCOUNTER — Other Ambulatory Visit: Payer: Self-pay | Admitting: General Surgery

## 2018-05-14 DIAGNOSIS — D869 Sarcoidosis, unspecified: Secondary | ICD-10-CM

## 2018-05-14 NOTE — Telephone Encounter (Signed)
I reviewed the most recent chest CT's with Kathleen Key, from 2020 and 2019. There has been substantial improvement, especially in the upper right lung. This is consistent with improving sarcoid. I recommended one more CT chest in a year. Plan - schedule future CT chest, no contrast, in Feb, 2021, for dx Sarcoid+

## 2018-05-20 ENCOUNTER — Other Ambulatory Visit: Payer: Self-pay

## 2018-05-22 MED FILL — SERTRALINE HCL 100 MG TAB: 100 | 30 days supply | Qty: 60 | Fill #0

## 2018-05-26 ENCOUNTER — Encounter (HOSPITAL_COMMUNITY): Payer: Self-pay | Admitting: Psychiatry

## 2018-05-26 ENCOUNTER — Ambulatory Visit (INDEPENDENT_AMBULATORY_CARE_PROVIDER_SITE_OTHER): Payer: 59 | Admitting: Psychiatry

## 2018-05-26 VITALS — BP 124/67 | HR 78 | Ht 65.0 in | Wt 120.0 lb

## 2018-05-26 DIAGNOSIS — F331 Major depressive disorder, recurrent, moderate: Secondary | ICD-10-CM

## 2018-05-26 DIAGNOSIS — F988 Other specified behavioral and emotional disorders with onset usually occurring in childhood and adolescence: Secondary | ICD-10-CM

## 2018-05-26 DIAGNOSIS — F330 Major depressive disorder, recurrent, mild: Secondary | ICD-10-CM | POA: Insufficient documentation

## 2018-05-26 DIAGNOSIS — F411 Generalized anxiety disorder: Secondary | ICD-10-CM | POA: Diagnosis not present

## 2018-05-26 MED ORDER — SERTRALINE HCL 100 MG PO TABS: 200.0000 mg | ORAL_TABLET | Freq: Every day | ORAL | 2 refills | Status: DC

## 2018-05-26 MED ORDER — AMPHETAMINE-DEXTROAMPHET ER 30 MG PO CP24: 30.0000 mg | ORAL_CAPSULE | Freq: Every day | ORAL | 0 refills | Status: DC

## 2018-05-26 NOTE — Progress Notes (Signed)
BH MD/PA/NP OP Progress Note  05/26/2018 3:07 PM Kathleen Key  MRN:  [id number]  Chief Complaint: Anxiety, difficulty focusing HPI: 53 yo female with hx of MDD/GAD and ADD coming for her first follow up visit. We have increased sertraline to 200 mg to better address anxiety sx and tried Adderall XR 20 ng for ADD (she previously failed mnethylphenidate trial). Mood improved but Mitra did not see any clear benefit from Adderall so far. She has rx for lorazepam for anxiety which she takes very infrequently (Rx is from March 2019) and hydroxyzine for sleep. She has filled out (partially) [place] Symptom Checklist for adult add and the result indicate that she has major problems within Distractibility (71%) and Underactivity (72%) clusters and moderate issues with Impulsivity (58%).   Visit Diagnosis:    ICD-10-CM   1. GAD (generalized anxiety disorder) F41.1   2. ADD (attention deficit disorder) without hyperactivity F98.8   3. Moderate episode of recurrent major depressive disorder ([place]) F33.1     Past Psychiatric History: Please see H&P from initial visit.  Past Medical History:  Past Medical History:  Diagnosis Date  . Asthma   . Fibroids    uterine  . IBS (irritable bowel syndrome)   . Pulmonary sarcoidosis ([place])   . Shortness of breath dyspnea     Past Surgical History:  Procedure Laterality Date  . ABDOMINAL HYSTERECTOMY N/A 10/13/2012   Procedure: HYSTERECTOMY ABDOMINAL;  Surgeon: Jonnie Kind, MD;  Location: [facility];  Service: Gynecology;  Laterality: N/A;  . BILATERAL SALPINGECTOMY Bilateral 10/13/2012   Procedure: BILATERAL SALPINGECTOMY;  Surgeon: Jonnie Kind, MD;  Location: [facility];  Service: Gynecology;  Laterality: Bilateral;  . BREAST LUMPECTOMY WITH RADIOACTIVE SEED LOCALIZATION Left 07/28/2015   Procedure: LEFT BREAST LUMPECTOMY WITH RADIOACTIVE SEED LOCALIZATION;  Surgeon: Autumn Messing III, MD;  Location: [facility];  Service: General;  Laterality: Left;   . WISDOM TOOTH EXTRACTION     Dr. Georgina Snell office-[facility], [place], [place]    Family Psychiatric History: reviewed  Family History:  Family History  Problem Relation Age of Onset  . Heart disease Mother        leaky valve  . Asthma Mother   . Hypertension Father        pacemaker  . Stroke Father   . Heart disease Father        pacemaker  . Fibromyalgia Sister   . Neuropathy Sister     Social History:  Social History   Socioeconomic History  . Marital status: Married    Spouse name: Not on file  . Number of children: Not on file  . Years of education: Not on file  . Highest education level: Not on file  Occupational History  . Not on file  Social Needs  . Financial resource strain: Not on file  . Food insecurity:    Worry: Not on file    Inability: Not on file  . Transportation needs:    Medical: Not on file    Non-medical: Not on file  Tobacco Use  . Smoking status: Never Smoker  . Smokeless tobacco: Never Used  Substance and Sexual Activity  . Alcohol use: Yes    Comment: occassional  . Drug use: No  . Sexual activity: Yes    Birth control/protection: Surgical  Lifestyle  . Physical activity:    Days per week: Not on file    Minutes per session: Not on file  . Stress: Not on  file  Relationships  . Social connections:    Talks on phone: Not on file    Gets together: Not on file    Attends religious service: Not on file    Active member of club or organization: Not on file    Attends meetings of clubs or organizations: Not on file    Relationship status: Not on file  Other Topics Concern  . Not on file  Social History Narrative  . Not on file    Allergies:  Allergies  Allergen Reactions  . Dilaudid [Hydromorphone Hcl] Itching  . Percocet [Oxycodone-Acetaminophen] Itching  . Excedrin Back & [Acetaminophen-Aspirin Buffered] Other (See Comments)    SUGAR-SPIKE, NERVOUSNESS    Metabolic Disorder Labs: No results found for:  HGBA1C, MPG No results found for: PROLACTIN No results found for: CHOL, TRIG, HDL, CHOLHDL, VLDL, LDLCALC No results found for: TSH  Therapeutic Level Labs: No results found for: LITHIUM No results found for: VALPROATE No components found for:  CBMZ  Current Medications: Current Outpatient Medications  Medication Sig Dispense Refill  . amphetamine-dextroamphetamine (ADDERALL XR) 30 MG 24 hr capsule Take 1 capsule (30 mg total) by mouth daily for 30 days. 30 capsule 0  . B Complex Vitamins (B-COMPLEX/B-12) TABS Take 1 tablet by mouth daily.    [name] docusate sodium (COLACE) 100 MG capsule Take 200 mg by mouth daily.     [name] estradiol (ESTRACE) 0.5 MG tablet     . levalbuterol (XOPENEX) 0.63 MG/3ML nebulizer solution Take 3 mLs (0.63 mg total) by nebulization every 6 (six) hours as needed for wheezing or shortness of breath. 75 mL 12  . LORazepam (ATIVAN) 0.5 MG tablet Take 1 tablet (0.5 mg total) by mouth every 8 (eight) hours. 30 tablet 5  . Magnesium 400 MG CAPS Take by mouth.    . Melatonin 5 MG CAPS Take by mouth.    . Multiple Vitamin (MULTIVITAMIN WITH MINERALS) TABS Take 1 tablet by mouth daily.    . polyethylene glycol (MIRALAX / GLYCOLAX) packet Take 17 g by mouth 3 (three) times daily as needed. 14 each 0  . pseudoephedrine (SUDAFED) 30 MG tablet Take 30 mg by mouth every 4 (four) hours as needed for congestion.    . sertraline (ZOLOFT) 100 MG tablet Take 2 tablets (200 mg total) by mouth daily. 60 tablet 2   No current facility-administered medications for this visit.      Musculoskeletal: Strength & Muscle Tone: within normal limits Gait & Station: normal Patient leans: N/A  Psychiatric Specialty Exam: Review of Systems  Constitutional: Negative.   HENT: Negative.   Eyes: Negative.   Respiratory: Negative.   Cardiovascular: Negative.   Gastrointestinal: Negative.   Genitourinary: Negative.   Musculoskeletal: Negative.   Skin: Negative.   Neurological: Negative.    Endo/Heme/Allergies: Negative.   Psychiatric/Behavioral: The patient is nervous/anxious.     Blood pressure 124/67, pulse 78, height 5\' 5"  (1.651 m), weight 120 lb (54.4 kg), last menstrual period 10/08/2012.Body mass index is 19.97 kg/m.  General Appearance: Casual and Well Groomed  Eye Contact:  Good  Speech:  Clear and Coherent  Volume:  Normal  Mood:  Anxious  Affect:  Full Range  Thought Process:  Goal Directed  Orientation:  Full (Time, Place, and Person)  Thought Content: Logical   Suicidal Thoughts:  No  Homicidal Thoughts:  No  Memory:  Immediate;   Good Recent;   Good Remote;   Good  Judgement:  Intact  Insight:  Good  Psychomotor Activity:  Normal  Concentration:  Concentration: Fair  Recall:  Good  Fund of Knowledge: Good  Language: Good  Akathisia:  Negative  Handed:  Right  AIMS (if indicated): not done  Assets:  Desire for Improvement Housing Physical Health Social Support Vocational/Educational  ADL's:  Intact  Cognition: WNL  Sleep:  Fair   Screenings: For [place] Symptom Checklist results see HPI.   Assessment and Plan: 53 yo female with MDD/GASD and ADD. Mood mildly anxious, some improvement since sertraline was increased. She does not need to use lorazepam often. Not much benefit in terms of improvement in concentration from 20 mg off Adderall XR. She tolerates it well (transient increase in anxiety only).  Plan: Continue Zoloft 200 mg ah HS, lorazepam prn anxiety (seldom needed). Increase dose of Adderall XR to 30 mg. We could also try Vyvanse or increase Adderall dose further if she has partial response to current dose. Return to clinic in one month.   Stephanie Acre, MD 05/26/2018, 3:07 PM

## 2018-05-27 ENCOUNTER — Ambulatory Visit (HOSPITAL_COMMUNITY): Payer: Self-pay | Admitting: Psychiatry

## 2018-06-01 MED FILL — ADDERALL XR 30 MG CAP SA: 30 | 30 days supply | Qty: 30 | Fill #0

## 2018-06-24 ENCOUNTER — Other Ambulatory Visit: Payer: Self-pay

## 2018-06-24 ENCOUNTER — Ambulatory Visit (INDEPENDENT_AMBULATORY_CARE_PROVIDER_SITE_OTHER): Payer: 59 | Admitting: Psychiatry

## 2018-06-24 VITALS — BP 111/68 | HR 59 | Temp 98.3°F | Ht 65.5 in | Wt 122.0 lb

## 2018-06-24 DIAGNOSIS — F988 Other specified behavioral and emotional disorders with onset usually occurring in childhood and adolescence: Secondary | ICD-10-CM

## 2018-06-24 DIAGNOSIS — F330 Major depressive disorder, recurrent, mild: Secondary | ICD-10-CM | POA: Diagnosis not present

## 2018-06-24 DIAGNOSIS — F411 Generalized anxiety disorder: Secondary | ICD-10-CM | POA: Diagnosis not present

## 2018-06-24 MED ORDER — LORAZEPAM 0.5 MG PO TABS: 0.5000 mg | ORAL_TABLET | Freq: Three times a day (TID) | ORAL | 0 refills | Status: AC | PRN

## 2018-06-24 MED ORDER — AMPHETAMINE-DEXTROAMPHET ER 30 MG PO CP24: 30.0000 mg | ORAL_CAPSULE | Freq: Every day | ORAL | 0 refills | Status: DC

## 2018-06-24 MED FILL — SERTRALINE HCL 100 MG TAB: 100 | 30 days supply | Qty: 60 | Fill #0

## 2018-06-24 NOTE — Progress Notes (Signed)
BH MD/PA/NP OP Progress Note  06/24/2018 3:24 PM Kathleen Key  MRN:  [id number]  Chief Complaint: Forgetfulness HPI: 53 yo female with MDD/GAD and ADD. Mood minimally anxious, improved since sertraline was increased. She does not use lorazepam often. Higher 30 mg dose of Adderall is more beneficial but she would often forget to take it. Sleep and appetite are good.  Visit Diagnosis:    ICD-10-CM   1. ADD (attention deficit disorder) without hyperactivity F98.8   2. GAD (generalized anxiety disorder) F41.1   3. Mild episode of recurrent major depressive disorder (H[facility]) F33.0     Past Psychiatric History: Please refer to initial H&P.  Past Medical History:  Past Medical History:  Diagnosis Date  . Asthma   . Fibroids    uterine  . IBS (irritable bowel syndrome)   . Pulmonary sarcoidosis ([place])   . Shortness of breath dyspnea     Past Surgical History:  Procedure Laterality Date  . ABDOMINAL HYSTERECTOMY N/A 10/13/2012   Procedure: HYSTERECTOMY ABDOMINAL;  Surgeon: Jonnie Kind, MD;  Location: [facility];  Service: Gynecology;  Laterality: N/A;  . BILATERAL SALPINGECTOMY Bilateral 10/13/2012   Procedure: BILATERAL SALPINGECTOMY;  Surgeon: Jonnie Kind, MD;  Location: [facility];  Service: Gynecology;  Laterality: Bilateral;  . BREAST LUMPECTOMY WITH RADIOACTIVE SEED LOCALIZATION Left 07/28/2015   Procedure: LEFT BREAST LUMPECTOMY WITH RADIOACTIVE SEED LOCALIZATION;  Surgeon: Autumn Messing III, MD;  Location: [facility];  Service: General;  Laterality: Left;  . WISDOM TOOTH EXTRACTION     Dr. Georgina Snell office-[facility], [place], [place]    Family Psychiatric History: Reviewed  Family History:  Family History  Problem Relation Age of Onset  . Heart disease Mother        leaky valve  . Asthma Mother   . Hypertension Father        pacemaker  . Stroke Father   . Heart disease Father        pacemaker  . Fibromyalgia Sister   . Neuropathy Sister     Social  History:  Social History   Socioeconomic History  . Marital status: Married    Spouse name: Not on file  . Number of children: Not on file  . Years of education: Not on file  . Highest education level: Not on file  Occupational History  . Not on file  Social Needs  . Financial resource strain: Not on file  . Food insecurity:    Worry: Not on file    Inability: Not on file  . Transportation needs:    Medical: Not on file    Non-medical: Not on file  Tobacco Use  . Smoking status: Never Smoker  . Smokeless tobacco: Never Used  Substance and Sexual Activity  . Alcohol use: Yes    Comment: occassional  . Drug use: No  . Sexual activity: Yes    Birth control/protection: Surgical  Lifestyle  . Physical activity:    Days per week: Not on file    Minutes per session: Not on file  . Stress: Not on file  Relationships  . Social connections:    Talks on phone: Not on file    Gets together: Not on file    Attends religious service: Not on file    Active member of club or organization: Not on file    Attends meetings of clubs or organizations: Not on file    Relationship status: Not on file  Other Topics Concern  .  Not on file  Social History Narrative  . Not on file    Allergies:  Allergies  Allergen Reactions  . Dilaudid [Hydromorphone Hcl] Itching  . Percocet [Oxycodone-Acetaminophen] Itching  . Excedrin Back & [Acetaminophen-Aspirin Buffered] Other (See Comments)    SUGAR-SPIKE, NERVOUSNESS    Metabolic Disorder Labs: No results found for: HGBA1C, MPG No results found for: PROLACTIN No results found for: CHOL, TRIG, HDL, CHOLHDL, VLDL, LDLCALC No results found for: TSH  Therapeutic Level Labs: No results found for: LITHIUM No results found for: VALPROATE No components found for:  CBMZ  Current Medications: Current Outpatient Medications  Medication Sig Dispense Refill  . amphetamine-dextroamphetamine (ADDERALL XR) 30 MG 24 hr capsule Take 1 capsule (30 mg  total) by mouth daily for 30 days. 30 capsule 0  . B Complex Vitamins (B-COMPLEX/B-12) TABS Take 1 tablet by mouth daily.    [name] docusate sodium (COLACE) 100 MG capsule Take 200 mg by mouth daily.     [name] estradiol (ESTRACE) 0.5 MG tablet     . levalbuterol (XOPENEX) 0.63 MG/3ML nebulizer solution Take 3 mLs (0.63 mg total) by nebulization every 6 (six) hours as needed for wheezing or shortness of breath. 75 mL 12  . LORazepam (ATIVAN) 0.5 MG tablet Take 1 tablet (0.5 mg total) by mouth every 8 (eight) hours as needed for up to 30 days for anxiety. 90 tablet 0  . Magnesium 400 MG CAPS Take by mouth.    . Melatonin 5 MG CAPS Take by mouth.    . Multiple Vitamin (MULTIVITAMIN WITH MINERALS) TABS Take 1 tablet by mouth daily.    . polyethylene glycol (MIRALAX / GLYCOLAX) packet Take 17 g by mouth 3 (three) times daily as needed. 14 each 0  . pseudoephedrine (SUDAFED) 30 MG tablet Take 30 mg by mouth every 4 (four) hours as needed for congestion.    . sertraline (ZOLOFT) 100 MG tablet Take 2 tablets (200 mg total) by mouth daily. 60 tablet 2   No current facility-administered medications for this visit.      Musculoskeletal: Strength & Muscle Tone: within normal limits Gait & Station: normal Patient leans: N/A  Psychiatric Specialty Exam: Review of Systems  Constitutional: Negative.   HENT: Negative.   Genitourinary: Negative.   Skin: Negative.   Neurological: Negative.   Psychiatric/Behavioral:       Forgetfulness     Last menstrual period 10/08/2012.There is no height or weight on file to calculate BMI.  General Appearance: Casual  Eye Contact:  Good  Speech:  Clear and Coherent  Volume:  Normal  Mood:  Anxious  Affect:  Full Range  Thought Process:  Goal Directed  Orientation:  Full (Time, Place, and Person)  Thought Content: Logical   Suicidal Thoughts:  No  Homicidal Thoughts:  No  Memory:  Immediate;   Good Recent;   Good Remote;   Good  Judgement:  Good  Insight:  Good   Psychomotor Activity:  Normal  Concentration:  Concentration: Fair  Recall:  Good  Fund of Knowledge: Good  Language: Good  Akathisia:  Negative  Handed:  Right  AIMS (if indicated): not done  Assets:  Communication Skills Desire for Improvement Financial Resources/Insurance Housing Physical Health Vocational/Educational  ADL's:  Intact  Cognition: WNL  Sleep:  Good     Assessment and Plan: 53 yo female with MDD/GAD and ADD. Mood mildly anxious, some improvement since sertraline was increased. She does not need to use lorazepam often. 30 mg off  Adderall XR is well tolerated and more beneficial than 20 mg. She however often forgets to take it - husband is trying to remind her.    Plan: Continue Zoloft 200 mg ah HS, lorazepam prn anxiety (seldom needed) and  Adderall XR t30 mg. Return to clinic in three months.   Stephanie Acre, MD 06/24/2018, 3:24 PM

## 2018-07-30 MED FILL — SERTRALINE HCL 100 MG TAB: 100 | 30 days supply | Qty: 60 | Fill #1

## 2018-08-25 MED FILL — SERTRALINE HCL 100 MG TAB: 100 | 30 days supply | Qty: 60 | Fill #2

## 2018-09-21 MED FILL — LORazepam 0.5 MG TABS: 0.5 | 30 days supply | Qty: 90 | Fill #0

## 2018-09-21 MED FILL — ADDERALL XR 30 MG CAP SA: 30 | 30 days supply | Qty: 30 | Fill #0

## 2018-09-26 ENCOUNTER — Other Ambulatory Visit (HOSPITAL_COMMUNITY): Payer: Self-pay | Admitting: Psychiatry

## 2018-09-28 MED FILL — SERTRALINE HCL 100 MG TABS: 100 | 30 days supply | Qty: 60 | Fill #0

## 2018-09-30 DIAGNOSIS — Z0000 Encounter for general adult medical examination without abnormal findings: Secondary | ICD-10-CM | POA: Diagnosis not present

## 2018-09-30 DIAGNOSIS — F3341 Major depressive disorder, recurrent, in partial remission: Secondary | ICD-10-CM | POA: Diagnosis not present

## 2018-09-30 DIAGNOSIS — R232 Flushing: Secondary | ICD-10-CM | POA: Diagnosis not present

## 2018-09-30 DIAGNOSIS — J4520 Mild intermittent asthma, uncomplicated: Secondary | ICD-10-CM | POA: Diagnosis not present

## 2018-09-30 DIAGNOSIS — K219 Gastro-esophageal reflux disease without esophagitis: Secondary | ICD-10-CM | POA: Diagnosis not present

## 2018-10-14 ENCOUNTER — Encounter: Payer: Self-pay | Admitting: Internal Medicine

## 2018-10-14 NOTE — Progress Notes (Signed)
Letter or recommendation

## 2018-10-19 ENCOUNTER — Other Ambulatory Visit: Payer: Self-pay | Admitting: Internal Medicine

## 2018-10-19 DIAGNOSIS — Z1231 Encounter for screening mammogram for malignant neoplasm of breast: Secondary | ICD-10-CM

## 2018-10-19 MED FILL — ESTRADIOL 1 MG TABS: 1 | 90 days supply | Qty: 90 | Fill #0

## 2018-10-20 ENCOUNTER — Other Ambulatory Visit: Payer: Self-pay

## 2018-10-20 ENCOUNTER — Ambulatory Visit (INDEPENDENT_AMBULATORY_CARE_PROVIDER_SITE_OTHER): Payer: 59 | Admitting: Psychiatry

## 2018-10-20 DIAGNOSIS — F330 Major depressive disorder, recurrent, mild: Secondary | ICD-10-CM

## 2018-10-20 DIAGNOSIS — F988 Other specified behavioral and emotional disorders with onset usually occurring in childhood and adolescence: Secondary | ICD-10-CM

## 2018-10-20 DIAGNOSIS — G4700 Insomnia, unspecified: Secondary | ICD-10-CM

## 2018-10-20 DIAGNOSIS — F411 Generalized anxiety disorder: Secondary | ICD-10-CM

## 2018-10-20 MED ORDER — TRAZODONE HCL 50 MG PO TABS: 50.0000 mg | ORAL_TABLET | Freq: Every evening | ORAL | 0 refills | Status: DC | PRN

## 2018-10-20 MED ORDER — AMPHET-DEXTROAMPHET 3-BEAD ER 50 MG PO CP24: 30.0000 mg | ORAL_CAPSULE | Freq: Every day | ORAL | 0 refills | Status: DC

## 2018-10-20 MED FILL — traZODone HCL 50 MG TABS: 50 | 30 days supply | Qty: 30 | Fill #0

## 2018-10-20 NOTE — Progress Notes (Signed)
[facility] MD/PA/NP OP Progress Note  10/20/2018 1:50 PM Kathleen Key  MRN:  [id number] Interview was conducted by phone and I verified that I was speaking with the correct person using two identifiers. I discussed the limitations of evaluation and management by telemedicine and  the availability of in person appointments. Patient expressed understanding and agreed to proceed.  Chief Complaint: Insomnia, problems with focusing.  HPI: 53 yo female with MDD/GAD and ADD. Mood mildly anxious, some improvement since sertraline was increased. She does not need to use lorazepam often. 30 mg off Adderall XR is well tolerated and more beneficial than 20 mg but still focusing ability is not optimal yet. [name] quit her job so anxiety is lower. She is looking for a new one (possibly with animals). She reports problems with falling asleep. Tried melatonin but without much benefit.     Visit Diagnosis:    ICD-10-CM   1. ADD (attention deficit disorder) without hyperactivity  F98.8   2. GAD (generalized anxiety disorder)  F41.1   3. Major depressive disorder, recurrent, mild ([facility])  F33.0     Past Psychiatric History: Please see intake H&P.  Past Medical History:  Past Medical History:  Diagnosis Date  . Asthma   . Fibroids    uterine  . IBS (irritable bowel syndrome)   . Pulmonary sarcoidosis ([place])   . Shortness of breath dyspnea     Past Surgical History:  Procedure Laterality Date  . ABDOMINAL HYSTERECTOMY N/A 10/13/2012   Procedure: HYSTERECTOMY ABDOMINAL;  Surgeon: Jonnie Kind, MD;  Location: [facility];  Service: Gynecology;  Laterality: N/A;  . BILATERAL SALPINGECTOMY Bilateral 10/13/2012   Procedure: BILATERAL SALPINGECTOMY;  Surgeon: Jonnie Kind, MD;  Location: [facility];  Service: Gynecology;  Laterality: Bilateral;  . BREAST LUMPECTOMY WITH RADIOACTIVE SEED LOCALIZATION Left 07/28/2015   Procedure: LEFT BREAST LUMPECTOMY WITH RADIOACTIVE SEED LOCALIZATION;  Surgeon: Autumn Messing III, MD;  Location:  [facility];  Service: General;  Laterality: Left;  . WISDOM TOOTH EXTRACTION     Dr. Georgina Snell office-[facility], [place], [place]    Family Psychiatric History: None  Family History:  Family History  Problem Relation Age of Onset  . Heart disease Mother        leaky valve  . Asthma Mother   . Hypertension Father        pacemaker  . Stroke Father   . Heart disease Father        pacemaker  . Fibromyalgia Sister   . Neuropathy Sister     Social History:  Social History   Socioeconomic History  . Marital status: Married    Spouse name: Not on file  . Number of children: Not on file  . Years of education: Not on file  . Highest education level: Not on file  Occupational History  . Not on file  Social Needs  . Financial resource strain: Not on file  . Food insecurity    Worry: Not on file    Inability: Not on file  . Transportation needs    Medical: Not on file    Non-medical: Not on file  Tobacco Use  . Smoking status: Never Smoker  . Smokeless tobacco: Never Used  Substance and Sexual Activity  . Alcohol use: Yes    Comment: occassional  . Drug use: No  . Sexual activity: Yes    Birth control/protection: Surgical  Lifestyle  . Physical activity    Days per week: Not on file  Minutes per session: Not on file  . Stress: Not on file  Relationships  . Social Herbalist on phone: Not on file    Gets together: Not on file    Attends religious service: Not on file    Active member of club or organization: Not on file    Attends meetings of clubs or organizations: Not on file    Relationship status: Not on file  Other Topics Concern  . Not on file  Social History Narrative  . Not on file    Allergies:  Allergies  Allergen Reactions  . Dilaudid [Hydromorphone Hcl] Itching  . Percocet [Oxycodone-Acetaminophen] Itching  . Excedrin Back & [Acetaminophen-Aspirin Buffered] Other (See Comments)    SUGAR-SPIKE, NERVOUSNESS     Metabolic Disorder Labs: No results found for: HGBA1C, MPG No results found for: PROLACTIN No results found for: CHOL, TRIG, HDL, CHOLHDL, VLDL, LDLCALC No results found for: TSH  Therapeutic Level Labs: No results found for: LITHIUM No results found for: VALPROATE No components found for:  CBMZ  Current Medications: Current Outpatient Medications  Medication Sig Dispense Refill  . amphetamine-dextroamphetamine (ADDERALL XR) 30 MG 24 hr capsule Take 1 capsule (30 mg total) by mouth daily for 30 days. 30 capsule 0  . B Complex Vitamins (B-COMPLEX/B-12) TABS Take 1 tablet by mouth daily.    [name] docusate sodium (COLACE) 100 MG capsule Take 200 mg by mouth daily.     [name] estradiol (ESTRACE) 0.5 MG tablet     . levalbuterol (XOPENEX) 0.63 MG/3ML nebulizer solution Take 3 mLs (0.63 mg total) by nebulization every 6 (six) hours as needed for wheezing or shortness of breath. 75 mL 12  . LORazepam (ATIVAN) 0.5 MG tablet Take 0.5 mg by mouth 3 (three) times daily as needed for anxiety.    . Magnesium 400 MG CAPS Take by mouth.    . Melatonin 5 MG CAPS Take by mouth.    . Multiple Vitamin (MULTIVITAMIN WITH MINERALS) TABS Take 1 tablet by mouth daily.    . polyethylene glycol (MIRALAX / GLYCOLAX) packet Take 17 g by mouth 3 (three) times daily as needed. 14 each 0  . pseudoephedrine (SUDAFED) 30 MG tablet Take 30 mg by mouth every 4 (four) hours as needed for congestion.    . sertraline (ZOLOFT) 100 MG tablet TAKE 2 TABLETS BY MOUTH ONCE A DAY 60 tablet 2   No current facility-administered medications for this visit.      Psychiatric Specialty Exam: Review of Systems  Psychiatric/Behavioral: The patient has insomnia.   All other systems reviewed and are negative.   Last menstrual period 10/08/2012.There is no height or weight on file to calculate BMI.  General Appearance: NA  Eye Contact:  NA  Speech:  Clear and Coherent and Normal Rate  Volume:  Normal  Mood:  Less anxious  Affect:   NA  Thought Process:  Goal Directed  Orientation:  Full (Time, Place, and Person)  Thought Content: Logical   Suicidal Thoughts:  No  Homicidal Thoughts:  No  Memory:  Immediate;   Good Recent;   Good Remote;   Good  Judgement:  Good  Insight:  Fair  Psychomotor Activity:  NA  Concentration:  Concentration: Fair  Recall:  Good  Fund of Knowledge: Good  Language: Good  Akathisia:  NA  Handed:  Right  AIMS (if indicated): not done  Assets:  Communication Skills Desire for Improvement Housing Physical Health Resilience Social Support  ADL's:  Intact  Cognition: WNL  Sleep:  Poor    Assessment and Plan: 53 yo female with MDD/GAD and ADD. Mood mildly anxious, some improvement since sertraline was increased. She does not need to use lorazepam often. 30 mg off Adderall XR is well tolerated and more beneficial than 20 mg but still focusing ability is not optimal yet. Dalayla quit her job so anxiety is lower. She is looking for a new one (possibly with animals). She reports problems with falling asleep. Tried melatonin but without much benefit.     Plan: Continue Zoloft 200 mg ah HS, lorazepam prn anxiety (seldom needed) and  increase Adderall XR to 50 mg (3 bead form). Add trazodone 50 mg for insomnia - she may take 100 mg if 50 mg not effective. Return to clinic in a month. The plan was discussed with patient who had an opportunity to ask questions and these were all answered. I spend 25 minutes in phone consultation with the patient.    Stephanie Acre, MD 10/20/2018, 1:50 PM

## 2018-10-21 ENCOUNTER — Telehealth (HOSPITAL_COMMUNITY): Payer: Self-pay

## 2018-10-21 ENCOUNTER — Other Ambulatory Visit (HOSPITAL_COMMUNITY): Payer: Self-pay | Admitting: Psychiatry

## 2018-10-21 MED ORDER — AMPHET-DEXTROAMPHET 3-BEAD ER 50 MG PO CP24: 50.0000 mg | ORAL_CAPSULE | Freq: Every day | ORAL | 0 refills | Status: DC

## 2018-10-21 MED FILL — MYDAYIS ER 50 MG CAPSULE: 50 | 30 days supply | Qty: 30 | Fill #0

## 2018-10-21 NOTE — Telephone Encounter (Signed)
Indeed despite dose increase to 50 mg instruction was automatically copied from previous prescription which was for 30 mg. It has been changed to reflect new dose.

## 2018-10-21 NOTE — Telephone Encounter (Signed)
Pharmacy called and stated the prescription for amphetamine-dextroamphetamine was written with the wrong directions. Please review and resend the correct prescription, thank you

## 2018-10-29 ENCOUNTER — Ambulatory Visit: Payer: 59 | Admitting: Internal Medicine

## 2018-11-09 ENCOUNTER — Ambulatory Visit: Payer: 59 | Admitting: Internal Medicine

## 2018-11-09 ENCOUNTER — Other Ambulatory Visit: Payer: Self-pay

## 2018-11-09 ENCOUNTER — Encounter: Payer: Self-pay | Admitting: Internal Medicine

## 2018-11-09 VITALS — BP 130/80 | HR 112 | Temp 98.5°F | Ht 65.0 in | Wt 120.4 lb

## 2018-11-09 DIAGNOSIS — F330 Major depressive disorder, recurrent, mild: Secondary | ICD-10-CM

## 2018-11-09 DIAGNOSIS — D869 Sarcoidosis, unspecified: Secondary | ICD-10-CM

## 2018-11-09 MED ORDER — ALBUTEROL SULFATE HFA 108 (90 BASE) MCG/ACT IN AERS: INHALATION_SPRAY | RESPIRATORY_TRACT | 12 refills | Status: DC

## 2018-11-09 MED FILL — ALBUTEROL SULFATE HFA 108 (: 108 (90 BAS | 25 days supply | Qty: 18 | Fill #0

## 2018-11-09 NOTE — Patient Instructions (Addendum)
Order- lab- BMET, Hepatic function panel, ACE level, CBC w diff   Dx Sarcoid  Script sent for albuterol rescue inhaler refill  Please call if we can help

## 2018-11-09 NOTE — Progress Notes (Signed)
HPI F never smoker followed for lung nodules, asthma/cough, sarcoid based on ACE-no bx ACE 06/02/13 76( 8-52)   Quant TB Gold 06/02/13- 0.2 neg FENO 08/07/16- She was unable to perform- unable to maintain stable airflow PFT 10/23/16-WNL ACE 08/07/16- 81 H (9-67)  BMET, LFTs and CBC were all normal. CT chest 07/2016 showed some improvement in small lung nodules, nothing worse. Still consistent with sarcoid. She had dropped off Plaquenil and will remain off  ---------------------------------------------------------------------------------------  04/25/17- 51 yoF never smoker followed for lung nodules, asthma/cough, sarcoid based on ACE-no bx, rash ----Pt doing well overall  xopenex neb,  albuterol hfa Little change in tendency to cough paroxysms with shortness of breath triggered by environmental irritants, strong odors and probably anxiety.  Uses albuterol occasionally as needed, rarely uses nebulizer.  Has not needed maintenance therapy Breo. Has had some episodes of incidental bilateral dry pruritic rash on volar forearms consistent with neurodermatitis.  I gave her low-dose prednisone to try for this but she is not using it. Denies adenopathy, fever, sweats.  11/09/2018- 65 yoF never smoker followed for lung nodules, asthma/cough, sarcoid based on rash and  ACE-no bx,  complicated by cough-variant asthma, Depression, ADD Meds include Adderall, neb xopenex,  lorazepam, trazodone -----f/u for sarcoidosis, pt states breathing is at baseline; reports shortness of breath w/ heat No longer has rash. Night sweats, but menopausal wo fever. No nodes. Mild cough, usually dry. Fall is usually her allergy/ asthma season.  Lab 04/25/17- ACE 100 H CT chest 04/22/2018- IMPRESSION: 1. Relatively similar appearance of perilymphatic distribution nodularity, consistent with the clinical history of sarcoidosis. Area of architectural distortion and nodularity within the posterior right upper lobe is significantly  improved, also consistent with an inflammatory etiology. 2. No thoracic adenopathy or evidence of acute superimposed process.  ROS-see HPI    "+" = positive Constitutional:    weight loss, night sweats, fevers, chills, fatigue, lassitude. HEENT:    headaches, difficulty swallowing, tooth/dental problems, sore throat,       sneezing, itching, ear ache, +nasal congestion, post nasal drip, snoring CV:    chest pain, orthopnea, PND, swelling in lower extremities, anasarca,                         dizziness, palpitations Resp:  + shortness of breath with exertion or at rest.                productive cough,  + non-productive cough, coughing up of blood.              change in color of mucus.  wheezing.   Skin:    +tiny intradermal inclusion nodule L upper ant chest wall near clavicle GI:  No-   heartburn, indigestion, abdominal pain, nausea, vomiting, diarrhea,                 change in bowel habits, loss of appetite GU: dysuria, change in color of urine, no urgency or frequency.   flank pain. MS:   joint pain, stiffness, decreased range of motion, back pain. Neuro-     nothing unusual Psych:  change in mood or affect.  depression or anxiety.   memory loss.  OBJ- Physical Exam General- Alert, Oriented, Affect-appropriate, Distress- none acute, + slender Skin- rash-none, lesions- none, excoriation- none Lymphadenopathy- none Head- atraumatic            Eyes- Gross vision intact, PERRLA, conjunctivae and secretions clear  Ears- Hearing, canals-normal            Nose- Clear, no-Septal dev, mucus, polyps, erosion, perforation             Throat- Mallampati II , mucosa clear , drainage- none, tonsils- atrophic Neck- flexible , trachea midline, no stridor , thyroid nl, carotid no bruit Chest - symmetrical excursion , unlabored           Heart/CV- RRR , no murmur , no gallop  , no rub, nl s1 s2                           - JVD- none , edema- none, stasis changes- none, varices- none            Lung- clear to P&A, wheeze- none, cough+ dry, dullness-none, rub- none           Chest wall-  Abd- No HSM Br/ Gen/ Rectal- Not done, not indicated Extrem- cyanosis- none, clubbing, none, atrophy- none, strength- nl Neuro- grossly intact to observation

## 2018-11-10 ENCOUNTER — Ambulatory Visit
Admission: RE | Admit: 2018-11-10 | Discharge: 2018-11-10 | Disposition: A | Discharge status: Home / Self Care | Payer: 59 | Source: Ambulatory Visit | Attending: Internal Medicine | Admitting: Internal Medicine

## 2018-11-10 DIAGNOSIS — Z1231 Encounter for screening mammogram for malignant neoplasm of breast: Secondary | ICD-10-CM

## 2018-11-10 LAB — CBC WITH DIFFERENTIAL/PLATELET
Basophils Absolute: 0 10*3/uL (ref 0.0–0.1)
Basophils Relative: 0.7 % (ref 0.0–3.0)
Eosinophils Absolute: 0.1 10*3/uL (ref 0.0–0.7)
Eosinophils Relative: 1.8 % (ref 0.0–5.0)
HCT: 38.9 % (ref 36.0–46.0)
Hemoglobin: 13 g/dL (ref 12.0–15.0)
Lymphocytes Relative: 23.8 % (ref 12.0–46.0)
Lymphs Abs: 1.3 10*3/uL (ref 0.7–4.0)
MCHC: 33.3 g/dL (ref 30.0–36.0)
MCV: 96.2 fl (ref 78.0–100.0)
Monocytes Absolute: 0.5 10*3/uL (ref 0.1–1.0)
Monocytes Relative: 8.6 % (ref 3.0–12.0)
Neutro Abs: 3.7 10*3/uL (ref 1.4–7.7)
Neutrophils Relative %: 65.1 % (ref 43.0–77.0)
Platelets: 297 10*3/uL (ref 150.0–400.0)
RBC: 4.05 Mil/uL (ref 3.87–5.11)
RDW: 13.1 % (ref 11.5–15.5)
WBC: 5.6 10*3/uL (ref 4.0–10.5)

## 2018-11-10 LAB — BASIC METABOLIC PANEL
BUN: 23 mg/dL (ref 6–23)
CO2: 28 mEq/L (ref 19–32)
Calcium: 9.2 mg/dL (ref 8.4–10.5)
Chloride: 104 mEq/L (ref 96–112)
Creatinine, Ser: 0.67 mg/dL (ref 0.40–1.20)
GFR: 92.67 mL/min (ref >60.00)
Glucose, Bld: 93 mg/dL (ref 70–99)
Potassium: 3.7 mEq/L (ref 3.5–5.1)
Sodium: 139 mEq/L (ref 135–145)

## 2018-11-10 LAB — HEPATIC FUNCTION PANEL
ALT: 17 U/L (ref 0–35)
AST: 19 U/L (ref 0–37)
Albumin: 4.3 g/dL (ref 3.5–5.2)
Alkaline Phosphatase: 49 U/L (ref 39–117)
Bilirubin, Direct: 0.1 mg/dL (ref 0.0–0.3)
Total Bilirubin: 0.3 mg/dL (ref 0.2–1.2)
Total Protein: 7.1 g/dL (ref 6.0–8.3)

## 2018-11-10 NOTE — Assessment & Plan Note (Signed)
No longer working here but says she is doing ok. Looking around for new job.

## 2018-11-10 NOTE — Assessment & Plan Note (Signed)
Discussed sarcoid, symptoms and activity.  Plan- ACE, BMET, Hepatic function, CBC for status update. Remain off treatment for now. Pleased that some nodules regressed.

## 2018-11-11 LAB — ANGIOTENSIN CONVERTING ENZYME: Angiotensin-Converting Enzyme: 73 U/L — ABNORMAL HIGH (ref 9–67)

## 2018-11-12 ENCOUNTER — Other Ambulatory Visit: Payer: Self-pay | Admitting: Internal Medicine

## 2018-11-12 DIAGNOSIS — R928 Other abnormal and inconclusive findings on diagnostic imaging of breast: Secondary | ICD-10-CM

## 2018-11-13 DIAGNOSIS — Z1211 Encounter for screening for malignant neoplasm of colon: Secondary | ICD-10-CM | POA: Diagnosis not present

## 2018-11-13 DIAGNOSIS — K5900 Constipation, unspecified: Secondary | ICD-10-CM | POA: Diagnosis not present

## 2018-11-17 MED FILL — SERTRALINE HCL 100 MG TAB: 100 | 30 days supply | Qty: 60 | Fill #1

## 2018-11-18 ENCOUNTER — Other Ambulatory Visit: Payer: Self-pay | Admitting: Internal Medicine

## 2018-11-18 ENCOUNTER — Ambulatory Visit
Admission: RE | Admit: 2018-11-18 | Discharge: 2018-11-18 | Disposition: A | Discharge status: Home / Self Care | Payer: 59 | Source: Ambulatory Visit | Attending: Internal Medicine | Admitting: Internal Medicine

## 2018-11-18 ENCOUNTER — Other Ambulatory Visit: Payer: Self-pay

## 2018-11-18 DIAGNOSIS — R928 Other abnormal and inconclusive findings on diagnostic imaging of breast: Secondary | ICD-10-CM

## 2018-11-18 DIAGNOSIS — R921 Mammographic calcification found on diagnostic imaging of breast: Secondary | ICD-10-CM

## 2018-11-18 MED FILL — PEG-3350 SOLUTION: 420 | 2 days supply | Qty: 4000 | Fill #0

## 2018-11-19 ENCOUNTER — Ambulatory Visit (INDEPENDENT_AMBULATORY_CARE_PROVIDER_SITE_OTHER): Payer: 59 | Admitting: Psychiatry

## 2018-11-19 DIAGNOSIS — F330 Major depressive disorder, recurrent, mild: Secondary | ICD-10-CM | POA: Diagnosis not present

## 2018-11-19 DIAGNOSIS — F988 Other specified behavioral and emotional disorders with onset usually occurring in childhood and adolescence: Secondary | ICD-10-CM | POA: Diagnosis not present

## 2018-11-19 DIAGNOSIS — F411 Generalized anxiety disorder: Secondary | ICD-10-CM

## 2018-11-19 MED ORDER — TRAZODONE HCL 50 MG PO TABS: 50.0000 mg | ORAL_TABLET | Freq: Every evening | ORAL | 2 refills | Status: DC | PRN

## 2018-11-19 MED ORDER — AMPHET-DEXTROAMPHET 3-BEAD ER 50 MG PO CP24: 50.0000 mg | ORAL_CAPSULE | Freq: Every day | ORAL | 0 refills | Status: DC

## 2018-11-19 MED ORDER — SERTRALINE HCL 100 MG PO TABS: 200.0000 mg | ORAL_TABLET | Freq: Every day | ORAL | 2 refills | Status: DC

## 2018-11-19 MED FILL — MYDAYIS ER 50 MG CAPSULE: 50 | 30 days supply | Qty: 30 | Fill #0

## 2018-11-19 MED FILL — traZODone HCL 50 MG TABS: 50 | 30 days supply | Qty: 30 | Fill #0

## 2018-11-19 NOTE — Progress Notes (Signed)
BH MD/PA/NP OP Progress Note  11/19/2018 10:43 AM Kathleen Key  MRN:  [id number] Interview was conducted by phone and I verified that I was speaking with the correct person using two identifiers. I discussed the limitations of evaluation and management by telemedicine and  the availability of in person appointments. Patient expressed understanding and agreed to proceed.  Chief Complaint: Mild anxiety.  HPI: 53 yo female with MDD/GAD and ADD.Mood mildly anxious but improved since sertraline was increased. She does not need to use lorazepam often. Adderall XRwas changed to Mydayis 50 mg and concentration now is much improved. Anevay notivced increased problems with constipation since it was started but she increased dose of Miralax and bowel movements are back to relative normal. Kathleen Key quit her job which contributed to decrease in anxiety level and is still looking for a new one (possibly with animals). Her insurance will run out at the end of this month though. She reports sleep is much better with 50 mg of trazodone - tried melatonin but without much benefit.    Visit Diagnosis:    ICD-10-CM   1. ADD (attention deficit disorder) without hyperactivity  F98.8   2. GAD (generalized anxiety disorder)  F41.1   3. Major depressive disorder, recurrent, mild (HCC)  F33.0     Past Psychiatric History: Please see intake H&P.  Past Medical History:  Past Medical History:  Diagnosis Date  . Asthma   . Fibroids    uterine  . IBS (irritable bowel syndrome)   . Pulmonary sarcoidosis ([place])   . Shortness of breath dyspnea     Past Surgical History:  Procedure Laterality Date  . ABDOMINAL HYSTERECTOMY N/A 10/13/2012   Procedure: HYSTERECTOMY ABDOMINAL;  Surgeon: Jonnie Kind, MD;  Location: [facility];  Service: Gynecology;  Laterality: N/A;  . BILATERAL SALPINGECTOMY Bilateral 10/13/2012   Procedure: BILATERAL SALPINGECTOMY;  Surgeon: Jonnie Kind, MD;  Location: [facility];  Service: Gynecology;   Laterality: Bilateral;  . BREAST BIOPSY Left 05/2015  . BREAST EXCISIONAL BIOPSY Left 05/2015  . BREAST LUMPECTOMY WITH RADIOACTIVE SEED LOCALIZATION Left 07/28/2015   Procedure: LEFT BREAST LUMPECTOMY WITH RADIOACTIVE SEED LOCALIZATION;  Surgeon: Autumn Messing III, MD;  Location: [facility];  Service: General;  Laterality: Left;  . WISDOM TOOTH EXTRACTION     Dr. Georgina Key office-[facility], [place], [place]    Family Psychiatric History: None  Family History:  Family History  Problem Relation Age of Onset  . Heart disease Mother        leaky valve  . Asthma Mother   . Hypertension Father        pacemaker  . Stroke Father   . Heart disease Father        pacemaker  . Fibromyalgia Sister   . Neuropathy Sister     Social History:  Social History   Socioeconomic History  . Marital status: Married    Spouse name: Not on file  . Number of children: Not on file  . Years of education: Not on file  . Highest education level: Not on file  Occupational History  . Not on file  Social Needs  . Financial resource strain: Not on file  . Food insecurity    Worry: Not on file    Inability: Not on file  . Transportation needs    Medical: Not on file    Non-medical: Not on file  Tobacco Use  . Smoking status: Never Smoker  . Smokeless tobacco: Never Used  Substance and Sexual Activity  . Alcohol use: Yes    Comment: occassional  . Drug use: No  . Sexual activity: Yes    Birth control/protection: Surgical  Lifestyle  . Physical activity    Days per week: Not on file    Minutes per session: Not on file  . Stress: Not on file  Relationships  . Social Herbalist on phone: Not on file    Gets together: Not on file    Attends religious service: Not on file    Active member of club or organization: Not on file    Attends meetings of clubs or organizations: Not on file    Relationship status: Not on file  Other Topics Concern  . Not on file   Social History Narrative  . Not on file    Allergies:  Allergies  Allergen Reactions  . Dilaudid [Hydromorphone Hcl] Itching  . Percocet [Oxycodone-Acetaminophen] Itching  . Excedrin Back & [Acetaminophen-Aspirin Buffered] Other (See Comments)    SUGAR-SPIKE, NERVOUSNESS    Metabolic Disorder Labs: No results found for: HGBA1C, MPG No results found for: PROLACTIN No results found for: CHOL, TRIG, HDL, CHOLHDL, VLDL, LDLCALC No results found for: TSH  Therapeutic Level Labs: No results found for: LITHIUM No results found for: VALPROATE No components found for:  CBMZ  Current Medications: Current Outpatient Medications  Medication Sig Dispense Refill  . albuterol (VENTOLIN HFA) 108 (90 Base) MCG/ACT inhaler Inhale 2 puffs every 6 hours if needed for breathing 18 g 12  . Amphet-Dextroamphet 3-Bead ER 50 MG CP24 Take 50 mg by mouth daily. 30 capsule 0  . B Complex Vitamins (B-COMPLEX/B-12) TABS Take 1 tablet by mouth daily.    [name] docusate sodium (COLACE) 100 MG capsule Take 200 mg by mouth daily.     [name] estradiol (ESTRACE) 0.5 MG tablet     . levalbuterol (XOPENEX) 0.63 MG/3ML nebulizer solution Take 3 mLs (0.63 mg total) by nebulization every 6 (six) hours as needed for wheezing or shortness of breath. 75 mL 12  . LORazepam (ATIVAN) 0.5 MG tablet Take 0.5 mg by mouth 3 (three) times daily as needed for anxiety.    . Magnesium 400 MG CAPS Take by mouth daily.     . Melatonin 5 MG CAPS Take by mouth 2 (two) times daily at 8 am and 10 pm.     . Multiple Vitamin (MULTIVITAMIN WITH MINERALS) TABS Take 1 tablet by mouth daily.    . polyethylene glycol (MIRALAX / GLYCOLAX) packet Take 17 g by mouth 3 (three) times daily as needed. 14 each 0  . pseudoephedrine (SUDAFED) 30 MG tablet Take 30 mg by mouth every 4 (four) hours as needed for congestion.    . sertraline (ZOLOFT) 100 MG tablet Take 2 tablets (200 mg total) by mouth daily. 60 tablet 2  . traZODone (DESYREL) 50 MG tablet Take 1  tablet (50 mg total) by mouth at bedtime as needed for sleep. 30 tablet 2   No current facility-administered medications for this visit.       Psychiatric Specialty Exam: Review of Systems  Psychiatric/Behavioral: The patient is nervous/anxious.   All other systems reviewed and are negative.   Last menstrual period 10/08/2012.There is no height or weight on file to calculate BMI.  General Appearance: NA  Eye Contact:  NA  Speech:  Clear and Coherent and Normal Rate  Volume:  Normal  Mood:  Anxious  Affect:  NA  Thought Process:  Goal Directed and Linear  Orientation:  Full (Time, Place, and Person)  Thought Content: Logical   Suicidal Thoughts:  No  Homicidal Thoughts:  No  Memory:  Immediate;   Good Recent;   Good Remote;   Good  Judgement:  Good  Insight:  Good  Psychomotor Activity:  NA  Concentration:  Concentration: Good  Recall:  Good  Fund of Knowledge: Good  Language: Good  Akathisia:  Negative  Handed:  Right  AIMS (if indicated): not done  Assets:  Communication Skills Desire for Improvement Housing Physical Health Resilience Social Support  ADL's:  Intact  Cognition: WNL  Sleep:  Good    Assessment and Plan: 53 yo female with MDD/GAD and ADD.Mood mildly anxious but improved since sertraline was increased. She does not need to use lorazepam often. Adderall XRwas changed to Mydayis 50 mg and concentration now is much improved. Mylia notivced increased problems with constipation since it was started but she increased dose of Miralax and bowel movements are back to relative normal. Raivyn quit her job which contributed to decrease in anxiety level and is still looking for a new one (possibly with animals). Her insurance will run out at the end of this month though. She reports sleep is much better with 50 mg of trazodone - tried melatonin but without much benefit.   Plan: Continue Zoloft 200 mg ah HS, lorazepam prn anxiety (seldom needed), trazodone 50 mg  at HS and Mydayis 50 mg. Next appointment in 3 months. The plan was discussed with patient who had an opportunity to ask questions and these were all answered. I spend 25 minutes in phone consultation with the patient.    Stephanie Acre, MD 11/19/2018, 10:43 AM

## 2018-11-23 DIAGNOSIS — Z1211 Encounter for screening for malignant neoplasm of colon: Secondary | ICD-10-CM | POA: Diagnosis not present

## 2018-12-02 ENCOUNTER — Ambulatory Visit: Payer: 59

## 2018-12-04 ENCOUNTER — Encounter (HOSPITAL_COMMUNITY): Payer: Self-pay

## 2018-12-22 ENCOUNTER — Other Ambulatory Visit: Payer: Self-pay

## 2018-12-22 DIAGNOSIS — Z20822 Contact with and (suspected) exposure to covid-19: Secondary | ICD-10-CM

## 2018-12-24 LAB — NOVEL CORONAVIRUS, NAA: SARS-CoV-2, NAA: NOT DETECTED

## 2018-12-28 ENCOUNTER — Other Ambulatory Visit: Payer: Self-pay

## 2018-12-28 DIAGNOSIS — Z20822 Contact with and (suspected) exposure to covid-19: Secondary | ICD-10-CM

## 2018-12-29 LAB — NOVEL CORONAVIRUS, NAA: SARS-CoV-2, NAA: NOT DETECTED

## 2018-12-31 ENCOUNTER — Telehealth: Payer: Self-pay | Admitting: Internal Medicine

## 2018-12-31 NOTE — Telephone Encounter (Signed)
Spoke with the pt  She states spent too much time outside yesterday and is now coughing- non prod and her throat is scratchy  She denies any soreness in her throat, fever, chills, body aches  She is caring her spouse who is recovering from covid (tested pos on 12/21/2018)  She took some promethazine with codeine cough med she had on hand but last night but this kept her awake and she no longer wants to take this  She declined televisit  Wants to know if there is anything else that CDY can rec  Please advise thanks   Allergies  Allergen Reactions  . Dilaudid [Hydromorphone Hcl] Itching  . Percocet [Oxycodone-Acetaminophen] Itching  . Excedrin Back & [Acetaminophen-Aspirin Buffered] Other (See Comments)    SUGAR-SPIKE, NERVOUSNESS   Current Outpatient Medications on File Prior to Visit  Medication Sig Dispense Refill  . albuterol (VENTOLIN HFA) 108 (90 Base) MCG/ACT inhaler Inhale 2 puffs every 6 hours if needed for breathing 18 g 12  . Amphet-Dextroamphet 3-Bead ER 50 MG CP24 Take 50 mg by mouth daily. 30 capsule 0  . B Complex Vitamins (B-COMPLEX/B-12) TABS Take 1 tablet by mouth daily.    [name] docusate sodium (COLACE) 100 MG capsule Take 200 mg by mouth daily.     [name] estradiol (ESTRACE) 0.5 MG tablet     . levalbuterol (XOPENEX) 0.63 MG/3ML nebulizer solution Take 3 mLs (0.63 mg total) by nebulization every 6 (six) hours as needed for wheezing or shortness of breath. 75 mL 12  . LORazepam (ATIVAN) 0.5 MG tablet Take 0.5 mg by mouth 3 (three) times daily as needed for anxiety.    . Magnesium 400 MG CAPS Take by mouth daily.     . Melatonin 5 MG CAPS Take by mouth 2 (two) times daily at 8 am and 10 pm.     . Multiple Vitamin (MULTIVITAMIN WITH MINERALS) TABS Take 1 tablet by mouth daily.    . polyethylene glycol (MIRALAX / GLYCOLAX) packet Take 17 g by mouth 3 (three) times daily as needed. 14 each 0  . pseudoephedrine (SUDAFED) 30 MG tablet Take 30 mg by mouth every 4 (four) hours as needed  for congestion.    . sertraline (ZOLOFT) 100 MG tablet Take 2 tablets (200 mg total) by mouth daily. 60 tablet 2  . traZODone (DESYREL) 50 MG tablet Take 1 tablet (50 mg total) by mouth at bedtime as needed for sleep. 30 tablet 2   No current facility-administered medications on file prior to visit.

## 2018-12-31 NOTE — Telephone Encounter (Signed)
No, we can cancel the repeat Covid test

## 2018-12-31 NOTE — Telephone Encounter (Signed)
Dr Annamaria Boots- I had forgot to mention in my previous msg to you that pt has tested for covid already on 9/22 and 9/28 and was negative. Sorry. Do you still want to order another swab?

## 2018-12-31 NOTE — Telephone Encounter (Signed)
LMTCB

## 2018-12-31 NOTE — Telephone Encounter (Signed)
Suggest we order Covid sab    Dx exposure  Suggest otc Delsym cough syrup and throat lozenges, stay well hydrated. Ok to use otc cough and cold remedies if helpful. At bedtime, Nyquil may work well.   If she feels she needs more than Delsym we can order Benzonatate perles 200 mg, # 30, 1 every 8 hours as needed for cough

## 2019-01-01 NOTE — Telephone Encounter (Signed)
Call made to patient, made aware of CY recommendations. Voiced understanding. Decline tessalon stating they have never helped her in the past. She reports she remains hoarse and sounds very congested on phone. I made her aware to give [place] a call back if she did not start feeling better.   Nothing further needed at this time.

## 2019-01-04 ENCOUNTER — Telehealth: Payer: Self-pay | Admitting: Internal Medicine

## 2019-01-04 ENCOUNTER — Other Ambulatory Visit: Payer: Self-pay | Admitting: Acute Care

## 2019-01-04 NOTE — Telephone Encounter (Signed)
Please send to Dr. Annamaria Boots to fill tomorrow when he returns. Thanks

## 2019-01-04 NOTE — Telephone Encounter (Signed)
Spoke with patient. She was calling back to say that the Delsym during the day and NyQuil at night is not helping her cough. She has tried [employer] before in the past and they do not work for her. She still has the non-productive cough. Denied any fevers, body aches or chills. Also denied any increased SOB. She has been tested twice for COVID and her results have been negative each time.   Advised her that CY has probably left for the day and I would have to send the message to the APP of the day, she verbalized understanding. Pharmacy is [facility] in [place].   Sarah, please advise. Thanks.

## 2019-01-04 NOTE — Telephone Encounter (Signed)
More effective cough meds are narcotic and she doesn't tolerate some of these. Can she take codeine? If so we can send her some codeine cough syrup for a few days.  Otherwise we could give a sample inhaler if she can come by to pick it up.

## 2019-01-04 NOTE — Telephone Encounter (Signed)
Spoke with patient. Advised her if her symptoms got any worse, to go to the nearest ED or urgent care, she verbalized understanding. She is also aware that I will route this message over to [facility] for him to address tomorrow.   CY, please advise. Thanks!

## 2019-01-05 ENCOUNTER — Telehealth: Payer: Self-pay | Admitting: Internal Medicine

## 2019-01-05 DIAGNOSIS — Z20828 Contact with and (suspected) exposure to other viral communicable diseases: Secondary | ICD-10-CM | POA: Diagnosis not present

## 2019-01-05 DIAGNOSIS — Z8616 Personal history of COVID-19: Secondary | ICD-10-CM

## 2019-01-05 HISTORY — DX: Personal history of COVID-19: Z86.16

## 2019-01-05 MED ORDER — INCRUSE ELLIPTA 62.5 MCG/INH IN AEPB: 1.0000 | INHALATION_SPRAY | Freq: Every day | RESPIRATORY_TRACT | 0 refills | Status: DC

## 2019-01-05 NOTE — Telephone Encounter (Signed)
Pt would like a call back. Would like to speak with a nurse about cough medication

## 2019-01-05 NOTE — Telephone Encounter (Signed)
If we have Incruse sample, please let her have 1- inhale 2 puff daily

## 2019-01-05 NOTE — Telephone Encounter (Signed)
We do have samples of Incruse, however, just want to clarify instructions as this is typically taken 1 puff daily and samples only have 7 puffs in each device. Please advise thanks!

## 2019-01-05 NOTE — Telephone Encounter (Signed)
Spoke with the pt  She does not feel like she could tolerate codeine  She states that she wants to try a sample of an inhaler  Please advise thanks  Allergies  Allergen Reactions  . Dilaudid [Hydromorphone Hcl] Itching  . Percocet [Oxycodone-Acetaminophen] Itching  . Excedrin Back & [Acetaminophen-Aspirin Buffered] Other (See Comments)    SUGAR-SPIKE, NERVOUSNESS   Current Outpatient Medications on File Prior to Visit  Medication Sig Dispense Refill  . albuterol (VENTOLIN HFA) 108 (90 Base) MCG/ACT inhaler Inhale 2 puffs every 6 hours if needed for breathing 18 g 12  . Amphet-Dextroamphet 3-Bead ER 50 MG CP24 Take 50 mg by mouth daily. 30 capsule 0  . B Complex Vitamins (B-COMPLEX/B-12) TABS Take 1 tablet by mouth daily.    [name] docusate sodium (COLACE) 100 MG capsule Take 200 mg by mouth daily.     [name] estradiol (ESTRACE) 0.5 MG tablet     . levalbuterol (XOPENEX) 0.63 MG/3ML nebulizer solution Take 3 mLs (0.63 mg total) by nebulization every 6 (six) hours as needed for wheezing or shortness of breath. 75 mL 12  . LORazepam (ATIVAN) 0.5 MG tablet Take 0.5 mg by mouth 3 (three) times daily as needed for anxiety.    . Magnesium 400 MG CAPS Take by mouth daily.     . Melatonin 5 MG CAPS Take by mouth 2 (two) times daily at 8 am and 10 pm.     . Multiple Vitamin (MULTIVITAMIN WITH MINERALS) TABS Take 1 tablet by mouth daily.    . polyethylene glycol (MIRALAX / GLYCOLAX) packet Take 17 g by mouth 3 (three) times daily as needed. 14 each 0  . pseudoephedrine (SUDAFED) 30 MG tablet Take 30 mg by mouth every 4 (four) hours as needed for congestion.    . sertraline (ZOLOFT) 100 MG tablet Take 2 tablets (200 mg total) by mouth daily. 60 tablet 2  . traZODone (DESYREL) 50 MG tablet Take 1 tablet (50 mg total) by mouth at bedtime as needed for sleep. 30 tablet 2   No current facility-administered medications on file prior to visit.

## 2019-01-05 NOTE — Telephone Encounter (Signed)
Spoke with the pt and made aware of recs per Dr Annamaria Boots  She verbalized understanding  Sample up front for pick up

## 2019-01-05 NOTE — Telephone Encounter (Signed)
error 

## 2019-01-05 NOTE — Telephone Encounter (Signed)
You are right, my typo    Let her have one sample- Inhale 1 puff once daily      Thanks Magda Paganini

## 2019-01-07 ENCOUNTER — Telehealth: Payer: Self-pay | Admitting: Internal Medicine

## 2019-01-07 MED ORDER — AZITHROMYCIN 250 MG PO TABS: ORAL_TABLET | ORAL | 0 refills | Status: DC

## 2019-01-07 NOTE — Telephone Encounter (Signed)
I would start antibiotic. Please ask Kanesha to let us know results of covid test either way.

## 2019-01-07 NOTE — Telephone Encounter (Signed)
Acute bilateral maxillary sinusitis   Is ok

## 2019-01-07 NOTE — Telephone Encounter (Signed)
Dr. Annamaria Boots, please advise if you are ok with [place] using the diagnosis code for a sinus infection or another diagnosis. Thanks!

## 2019-01-07 NOTE — Telephone Encounter (Signed)
Message routed to Dr. Annamaria Boots,  Dr. Annamaria Boots, I called the patient and advised her of the response from you. She stated Dr. Laurann Montana her PCP had her tested for Covid on Tuesday, waiting on the results. She wanted to know if she should still go ahead and start the antibiotic?  The prescription has been sent to the pharmacy she requested.

## 2019-01-07 NOTE — Telephone Encounter (Signed)
Crystal at [facility] in [place].  Needs diagnosis for antibiotic sent in.  [phone].

## 2019-01-07 NOTE — Telephone Encounter (Signed)
This is beginning to sound more like a sinus infection. It is time to retest for Covid I suggest we send Z-pak # 6, 2 today then one daily

## 2019-01-07 NOTE — Telephone Encounter (Signed)
ATC [facility], no one would answer the line and I could not leave a message. I have resent the prescription along with the diagnosis code.

## 2019-01-07 NOTE — Telephone Encounter (Signed)
Called and spoke to pt. Pt c/o increase in SOB, sinus congestion and headache, prod cough with yellow mucus, dizziness x 2-3 days. Pt states she has tried the Delsym during the day and Nyquil at night with no relief. Pt is still taking her Incruse and taking Sudafed prn. Pt denies chest tightness, f/c/s.   Dr. Annamaria Boots please advise. Thanks.   Allergies  Allergen Reactions  . Dilaudid [Hydromorphone Hcl] Itching  . Percocet [Oxycodone-Acetaminophen] Itching  . Excedrin Back & [Acetaminophen-Aspirin Buffered] Other (See Comments)    SUGAR-SPIKE, NERVOUSNESS   Current Outpatient Medications on File Prior to Visit  Medication Sig Dispense Refill  . albuterol (VENTOLIN HFA) 108 (90 Base) MCG/ACT inhaler Inhale 2 puffs every 6 hours if needed for breathing 18 g 12  . Amphet-Dextroamphet 3-Bead ER 50 MG CP24 Take 50 mg by mouth daily. 30 capsule 0  . B Complex Vitamins (B-COMPLEX/B-12) TABS Take 1 tablet by mouth daily.    [name] docusate sodium (COLACE) 100 MG capsule Take 200 mg by mouth daily.     [name] estradiol (ESTRACE) 0.5 MG tablet     . levalbuterol (XOPENEX) 0.63 MG/3ML nebulizer solution Take 3 mLs (0.63 mg total) by nebulization every 6 (six) hours as needed for wheezing or shortness of breath. 75 mL 12  . LORazepam (ATIVAN) 0.5 MG tablet Take 0.5 mg by mouth 3 (three) times daily as needed for anxiety.    . Magnesium 400 MG CAPS Take by mouth daily.     . Melatonin 5 MG CAPS Take by mouth 2 (two) times daily at 8 am and 10 pm.     . Multiple Vitamin (MULTIVITAMIN WITH MINERALS) TABS Take 1 tablet by mouth daily.    . polyethylene glycol (MIRALAX / GLYCOLAX) packet Take 17 g by mouth 3 (three) times daily as needed. 14 each 0  . pseudoephedrine (SUDAFED) 30 MG tablet Take 30 mg by mouth every 4 (four) hours as needed for congestion.    . sertraline (ZOLOFT) 100 MG tablet Take 2 tablets (200 mg total) by mouth daily. 60 tablet 2  . traZODone (DESYREL) 50 MG tablet Take 1 tablet (50 mg total) by  mouth at bedtime as needed for sleep. 30 tablet 2  . umeclidinium bromide (INCRUSE ELLIPTA) 62.5 MCG/INH AEPB Inhale 1 puff into the lungs daily. 7 each 0   No current facility-administered medications on file prior to visit.

## 2019-01-08 ENCOUNTER — Telehealth (HOSPITAL_COMMUNITY): Payer: Self-pay

## 2019-01-08 ENCOUNTER — Telehealth: Payer: Self-pay | Admitting: Internal Medicine

## 2019-01-08 NOTE — Telephone Encounter (Addendum)
ATC pt, line went to voicemail, LMTCB x1.  (Per CY: Pt will need to self quarantine or, if ill enough, seek emergency care. CY denies receiving a message from Dr. Laurann Montana at this time. Per protocol, pt will need a 1 week telephone visit w/ CY to monitor symptoms. We also need to know if Dr. Laurann Montana has prescribed anything for pt (I.e. antibiotic))   Routing to myself for f/u.

## 2019-01-08 NOTE — Telephone Encounter (Signed)
Patient is calling because she can not afford the [place]. She states that her husband is out of work right now and they can not pay the copay. She wants to know if you can revisit Adderall or Ritalin as they are both cheaper.

## 2019-01-10 ENCOUNTER — Other Ambulatory Visit (HOSPITAL_COMMUNITY): Payer: Self-pay | Admitting: Psychiatry

## 2019-01-10 MED ORDER — AMPHETAMINE-DEXTROAMPHET ER 30 MG PO CP24: 30.0000 mg | ORAL_CAPSULE | Freq: Every day | ORAL | 0 refills | Status: DC

## 2019-01-10 NOTE — Telephone Encounter (Signed)
I did order her previous dose of Adderall XR 30 mg.

## 2019-01-11 NOTE — Telephone Encounter (Signed)
Called and spoke w/ pt regarding the message below. Pt agreed to setting up a televisit appt per covid protocol this Friday 01/15/2019 at 11:30 AM. Pt states she has been taking azithromycin per CY and is aware she needs to self-isolate unless she has severe or worsening symptoms, where she would need to seek emergency care. Appt has been scheduled. Routing to CY as an [place]. Nothing further needed at this time.

## 2019-01-12 ENCOUNTER — Other Ambulatory Visit (HOSPITAL_COMMUNITY): Payer: Self-pay | Admitting: Psychiatry

## 2019-01-12 ENCOUNTER — Telehealth (HOSPITAL_COMMUNITY): Payer: Self-pay

## 2019-01-12 MED ORDER — AMPHETAMINE-DEXTROAMPHET ER 30 MG PO CP24: 30.0000 mg | ORAL_CAPSULE | Freq: Every day | ORAL | 0 refills | Status: DC

## 2019-01-12 NOTE — Telephone Encounter (Signed)
Please send patients Adderall XR to [facility] in [place]. She can no longer get prescriptions via [facility]

## 2019-01-12 NOTE — Telephone Encounter (Signed)
Done

## 2019-01-15 ENCOUNTER — Ambulatory Visit (INDEPENDENT_AMBULATORY_CARE_PROVIDER_SITE_OTHER): Payer: PRIVATE HEALTH INSURANCE | Admitting: Internal Medicine

## 2019-01-15 ENCOUNTER — Encounter: Payer: Self-pay | Admitting: Internal Medicine

## 2019-01-15 DIAGNOSIS — D869 Sarcoidosis, unspecified: Secondary | ICD-10-CM

## 2019-01-15 DIAGNOSIS — U071 COVID-19: Secondary | ICD-10-CM

## 2019-01-15 MED ORDER — TRAMADOL HCL 50 MG PO TABS: ORAL_TABLET | ORAL | 0 refills | Status: DC

## 2019-01-15 NOTE — Progress Notes (Signed)
HPI F never smoker followed for lung nodules, asthma/cough, sarcoid based on ACE-no bx ACE 06/02/13 76( 8-52)   Quant TB Gold 06/02/13- 0.2 neg FENO 08/07/16- She was unable to perform- unable to maintain stable airflow PFT 10/23/16-WNL ACE 08/07/16- 81 H (9-67)  BMET, LFTs and CBC were all normal. CT chest 07/2016 showed some improvement in small lung nodules, nothing worse. Still consistent with sarcoid. She had dropped off Plaquenil and will remain off  ---------------------------------------------------------------------------------------   11/09/2018- 53 yoF never smoker followed for lung nodules, asthma/cough, sarcoid based on rash and  ACE-no bx,  complicated by cough-variant asthma, Depression, ADD Meds include Adderall, neb xopenex,  lorazepam, trazodone -----f/u for sarcoidosis, pt states breathing is at baseline; reports shortness of breath w/ heat No longer has rash. Night sweats, but menopausal wo fever. No nodes. Mild cough, usually dry. Fall is usually her allergy/ asthma season.  Lab 04/25/17- ACE 100 H CT chest 04/22/2018- IMPRESSION: 1. Relatively similar appearance of perilymphatic distribution nodularity, consistent with the clinical history of sarcoidosis. Area of architectural distortion and nodularity within the posterior right upper lobe is significantly improved, also consistent with an inflammatory etiology. 2. No thoracic adenopathy or evidence of acute superimposed process.  01/15/2019- Virtual Visit via Telephone Note  I connected with [name] on 01/15/19 at 11:30 AM EDT by telephone and verified that I am speaking with the correct person using two identifiers.  Location: Patient: H Provider: O   I discussed the limitations, risks, security and privacy concerns of performing an evaluation and management service by telephone and the availability of in person appointments. I also discussed with the patient that there may be a patient responsible charge related to  this service. The patient expressed understanding and agreed to proceed.   History of Present Illness: 48 yoF never smoker followed for lung nodules, asthma/cough, sarcoid based on rash and  ACE-no bx,  complicated by cough-variant asthma, Depression, ADD Meds include Adderall, neb xopenex, albuterol hfa, Incruse,  lorazepam, trazodone Given Zpak for Respiratory infection symptoms 10/8, before she reported to [place] that [facility] tested her positive for Covid from tested done 10/6.[name] Caught from husband who is now much better. She became ill on 10/1. She is improving gradually- still sweats, chest congestion, cough, orthostatic dizzy. Cough is disturbing. Tessalon and Delsym no help. Willing to try tramadol, knowing it may cross-sensitize with narcotics that cause itching.   Discussed Sarcoid- ACE has come down. Observations/Objective: ACE down from 100 to 73 11/09/2018 Covid swab Neg 9/22, 9/28, + Covid through [facility] 10/6 Assessment and Plan: Covid infection. Recovering at home. Discussed potential worsening which would send her to ER. Sarcoid- Originally lung nodules. ACE coming down/ Expect this to burn itself out.  Follow Up Instructions: She is willing to try tramadol for cough, knowing it may cause side-effects similar to her pruritus from narcotics.  Keep appt next August unless needed sooner. Stay in touch through Covid recovery.    I discussed the assessment and treatment plan with the patient. The patient was provided an opportunity to ask questions and all were answered. The patient agreed with the plan and demonstrated an understanding of the instructions.   The patient was advised to call back or seek an in-person evaluation if the symptoms worsen or if the condition fails to improve as anticipated.  I provided 15 minutes of non-face-to-face time during this encounter.   [name], MD    ROS-see HPI    "+" = positive Constitutional:  weight loss, night sweats, fevers,  chills, fatigue, lassitude. HEENT:    headaches, difficulty swallowing, tooth/dental problems, sore throat,       sneezing, itching, ear ache, +nasal congestion, post nasal drip, snoring CV:    chest pain, orthopnea, PND, swelling in lower extremities, anasarca,                         dizziness, palpitations Resp:  + shortness of breath with exertion or at rest.                productive cough,  + non-productive cough, coughing up of blood.              change in color of mucus.  wheezing.   Skin:    +tiny intradermal inclusion nodule L upper ant chest wall near clavicle GI:  No-   heartburn, indigestion, abdominal pain, nausea, vomiting, diarrhea,                 change in bowel habits, loss of appetite GU: dysuria, change in color of urine, no urgency or frequency.   flank pain. MS:   joint pain, stiffness, decreased range of motion, back pain. Neuro-     nothing unusual Psych:  change in mood or affect.  depression or anxiety.   memory loss.  OBJ- Physical Exam General- Alert, Oriented, Affect-appropriate, Distress- none acute, + slender Skin- rash-none, lesions- none, excoriation- none Lymphadenopathy- none Head- atraumatic            Eyes- Gross vision intact, PERRLA, conjunctivae and secretions clear            Ears- Hearing, canals-normal            Nose- Clear, no-Septal dev, mucus, polyps, erosion, perforation             Throat- Mallampati II , mucosa clear , drainage- none, tonsils- atrophic Neck- flexible , trachea midline, no stridor , thyroid nl, carotid no bruit Chest - symmetrical excursion , unlabored           Heart/CV- RRR , no murmur , no gallop  , no rub, nl s1 s2                           - JVD- none , edema- none, stasis changes- none, varices- none           Lung- clear to P&A, wheeze- none, cough+ dry, dullness-none, rub- none           Chest wall-  Abd- No HSM Br/ Gen/ Rectal- Not done, not indicated Extrem- cyanosis- none, clubbing, none, atrophy- none,  strength- nl Neuro- grossly intact to observation

## 2019-01-17 DIAGNOSIS — U071 COVID-19: Secondary | ICD-10-CM | POA: Insufficient documentation

## 2019-01-17 NOTE — Assessment & Plan Note (Signed)
ACE declining, 73 as of 11/09/2018

## 2019-01-17 NOTE — Assessment & Plan Note (Signed)
Feels she is improving with persistent cough, chest congestion, sweats as outlined. Symptomatic management and self-quarantine at home for now. Discussed indications for ER if worsens.

## 2019-02-17 ENCOUNTER — Other Ambulatory Visit (HOSPITAL_COMMUNITY): Payer: Self-pay | Admitting: *Deleted

## 2019-02-17 ENCOUNTER — Other Ambulatory Visit (HOSPITAL_COMMUNITY): Payer: Self-pay

## 2019-02-17 MED ORDER — SERTRALINE HCL 100 MG PO TABS: 200.0000 mg | ORAL_TABLET | Freq: Every day | ORAL | 0 refills | Status: DC

## 2019-02-18 ENCOUNTER — Other Ambulatory Visit: Payer: Self-pay

## 2019-02-18 ENCOUNTER — Ambulatory Visit (INDEPENDENT_AMBULATORY_CARE_PROVIDER_SITE_OTHER): Payer: 59 | Admitting: Psychiatry

## 2019-02-18 DIAGNOSIS — F988 Other specified behavioral and emotional disorders with onset usually occurring in childhood and adolescence: Secondary | ICD-10-CM

## 2019-02-18 DIAGNOSIS — F411 Generalized anxiety disorder: Secondary | ICD-10-CM

## 2019-02-18 DIAGNOSIS — F3341 Major depressive disorder, recurrent, in partial remission: Secondary | ICD-10-CM

## 2019-02-18 MED ORDER — LORAZEPAM 0.5 MG PO TABS: 0.5000 mg | ORAL_TABLET | Freq: Two times a day (BID) | ORAL | 2 refills | Status: DC | PRN

## 2019-02-18 MED ORDER — AMPHETAMINE-DEXTROAMPHET ER 20 MG PO CP24: 40.0000 mg | ORAL_CAPSULE | Freq: Every day | ORAL | 0 refills | Status: DC

## 2019-02-18 NOTE — Progress Notes (Signed)
BH MD/PA/NP OP Progress Note  02/18/2019 10:46 AM Kathleen Key  MRN:  [id number] Interview was conducted by phone and I verified that I was speaking with the correct person using two identifiers. I discussed the limitations of evaluation and management by telemedicine and  the availability of in person appointments. Patient expressed understanding and agreed to proceed.  Chief Complaint: Concentration problems, anxiety.  HPI: 53yo female with MDD/GAD and ADD.Mood mildly anxious but improved since sertraline was increased. She does not need to use lorazepam often. Adderall XRis helpful but she still has problems with concentration/task completion. Kathleen Key notivced increased problems with constipation which resolved once she stopped taking trazodone (sleep remains normal). 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 She and her husband has tested COVID positive, he ws in the hospitak she had a mild case. Both have since recovered.   Visit Diagnosis:    ICD-10-CM   1. ADD (attention deficit disorder) without hyperactivity  F98.8   2. GAD (generalized anxiety disorder)  F41.1   3. Major depressive disorder, recurrent episode, in partial remission (HCC)  F33.41     Past Psychiatric History: Please see intake H&P.  Past Medical History:  Past Medical History:  Diagnosis Date  . Asthma   . Fibroids    uterine  . IBS (irritable bowel syndrome)   . Pulmonary sarcoidosis ([place])   . Shortness of breath dyspnea     Past Surgical History:  Procedure Laterality Date  . ABDOMINAL HYSTERECTOMY N/A 10/13/2012   Procedure: HYSTERECTOMY ABDOMINAL;  Surgeon: Jonnie Kind, MD;  Location: [facility];  Service: Gynecology;  Laterality: N/A;  . BILATERAL SALPINGECTOMY Bilateral 10/13/2012   Procedure: BILATERAL SALPINGECTOMY;  Surgeon: Jonnie Kind, MD;  Location: [facility];  Service: Gynecology;  Laterality: Bilateral;  . BREAST BIOPSY Left 05/2015  . BREAST EXCISIONAL BIOPSY  Left 05/2015  . BREAST LUMPECTOMY WITH RADIOACTIVE SEED LOCALIZATION Left 07/28/2015   Procedure: LEFT BREAST LUMPECTOMY WITH RADIOACTIVE SEED LOCALIZATION;  Surgeon: Autumn Messing III, MD;  Location: [facility];  Service: General;  Laterality: Left;  . WISDOM TOOTH EXTRACTION     Dr. Georgina Snell office-[facility], [place], [place]    Family Psychiatric History: None.  Family History:  Family History  Problem Relation Age of Onset  . Heart disease Mother        leaky valve  . Asthma Mother   . Hypertension Father        pacemaker  . Stroke Father   . Heart disease Father        pacemaker  . Fibromyalgia Sister   . Neuropathy Sister     Social History:  Social History   Socioeconomic History  . Marital status: Married    Spouse name: Not on file  . Number of children: Not on file  . Years of education: Not on file  . Highest education level: Not on file  Occupational History  . Not on file  Social Needs  . Financial resource strain: Not on file  . Food insecurity    Worry: Not on file    Inability: Not on file  . Transportation needs    Medical: Not on file    Non-medical: Not on file  Tobacco Use  . Smoking status: Never Smoker  . Smokeless tobacco: Never Used  Substance and Sexual Activity  . Alcohol use: Yes    Comment: occassional  . Drug use: No  . Sexual activity: Yes  Birth control/protection: Surgical  Lifestyle  . Physical activity    Days per week: Not on file    Minutes per session: Not on file  . Stress: Not on file  Relationships  . Social Herbalist on phone: Not on file    Gets together: Not on file    Attends religious service: Not on file    Active member of club or organization: Not on file    Attends meetings of clubs or organizations: Not on file    Relationship status: Not on file  Other Topics Concern  . Not on file  Social History Narrative  . Not on file    Allergies:  Allergies  Allergen  Reactions  . Dilaudid [Hydromorphone Hcl] Itching  . Percocet [Oxycodone-Acetaminophen] Itching  . Excedrin Back & [Acetaminophen-Aspirin Buffered] Other (See Comments)    SUGAR-SPIKE, NERVOUSNESS    Metabolic Disorder Labs: No results found for: HGBA1C, MPG No results found for: PROLACTIN No results found for: CHOL, TRIG, HDL, CHOLHDL, VLDL, LDLCALC No results found for: TSH  Therapeutic Level Labs: No results found for: LITHIUM No results found for: VALPROATE No components found for:  CBMZ  Current Medications: Current Outpatient Medications  Medication Sig Dispense Refill  . albuterol (VENTOLIN HFA) 108 (90 Base) MCG/ACT inhaler Inhale 2 puffs every 6 hours if needed for breathing 18 g 12  . amphetamine-dextroamphetamine (ADDERALL XR) 20 MG 24 hr capsule Take 2 capsules (40 mg total) by mouth daily. 60 capsule 0  . B Complex Vitamins (B-COMPLEX/B-12) TABS Take 1 tablet by mouth daily.    [name] docusate sodium (COLACE) 100 MG capsule Take 200 mg by mouth daily.     [name] estradiol (ESTRACE) 0.5 MG tablet     . levalbuterol (XOPENEX) 0.63 MG/3ML nebulizer solution Take 3 mLs (0.63 mg total) by nebulization every 6 (six) hours as needed for wheezing or shortness of breath. 75 mL 12  . LORazepam (ATIVAN) 0.5 MG tablet Take 1 tablet (0.5 mg total) by mouth 2 (two) times daily as needed for anxiety. 60 tablet 2  . Magnesium 400 MG CAPS Take by mouth daily.     . Melatonin 5 MG CAPS Take by mouth 2 (two) times daily at 8 am and 10 pm.     . Multiple Vitamin (MULTIVITAMIN WITH MINERALS) TABS Take 1 tablet by mouth daily.    . polyethylene glycol (MIRALAX / GLYCOLAX) packet Take 17 g by mouth 3 (three) times daily as needed. 14 each 0  . pseudoephedrine (SUDAFED) 30 MG tablet Take 30 mg by mouth every 4 (four) hours as needed for congestion.    . sertraline (ZOLOFT) 100 MG tablet Take 2 tablets (200 mg total) by mouth daily. 60 tablet 0  . traMADol (ULTRAM) 50 MG tablet 1 every 8 hours if needed  for cough 10 tablet 0   No current facility-administered medications for this visit.      Psychiatric Specialty Exam: Review of Systems  Psychiatric/Behavioral: The patient is nervous/anxious.   All other systems reviewed and are negative.   Last menstrual period 10/08/2012.There is no height or weight on file to calculate BMI.  General Appearance: NA  Eye Contact:  NA  Speech:  Clear and Coherent and Normal Rate  Volume:  Normal  Mood:  Anxious  Affect:  NA  Thought Process:  Goal Directed  Orientation:  Full (Time, Place, and Person)  Thought Content: Logical   Suicidal Thoughts:  No  Homicidal Thoughts:  No  Memory:  Immediate;   Good Recent;   Good Remote;   Good  Judgement:  Good  Insight:  Good  Psychomotor Activity:  NA  Concentration:  Concentration: Fair  Recall:  Good  Fund of Knowledge: Good  Language: Good  Akathisia:  Negative  Handed:  Right  AIMS (if indicated): not done  Assets:  Communication Skills Desire for Improvement Financial Resources/Insurance Housing Intimacy Resilience Social Support  ADL's:  Intact  Cognition: WNL  Sleep:  Good    Assessment and Plan: 53yo female with MDD/GAD and ADD.Mood mildly anxious but improved since sertraline was increased. She does not need to use lorazepam often. Adderall XRis helpful but she still has problems with concentration/task completion. Ola notivced increased problems with constipation which resolved once she stopped taking trazodone (sleep remains normal). 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 She and her husband has tested COVID positive, he ws in the hospitak she had a mild case. Both have since recovered.   Dx: ADD; GAD; MDD recurrent in partial remission  Plan: Continue Zoloft 200 mg ah HS, lorazepam prn anxiety (seldom needed) and increase Adderall XR to 40 mg daily. Next appointment in 2 months. The plan was discussed with patient who had an opportunity to  ask questions and these were all answered. I spend25 minutes inphone consultation with the patient.    Stephanie Acre, MD 02/18/2019, 10:46 AM

## 2019-03-05 ENCOUNTER — Other Ambulatory Visit: Payer: Self-pay

## 2019-03-05 ENCOUNTER — Inpatient Hospital Stay (HOSPITAL_COMMUNITY)
Admission: AD | Admit: 2019-03-05 | Discharge: 2019-03-12 | DRG: 885 | Disposition: A | Discharge status: Home / Self Care | Payer: Federal, State, Local not specified - Other | Source: Intra-hospital | Attending: Psychiatry | Admitting: Psychiatry

## 2019-03-05 DIAGNOSIS — D860 Sarcoidosis of lung: Secondary | ICD-10-CM | POA: Diagnosis present

## 2019-03-05 DIAGNOSIS — Z885 Allergy status to narcotic agent status: Secondary | ICD-10-CM | POA: Diagnosis not present

## 2019-03-05 DIAGNOSIS — Z20828 Contact with and (suspected) exposure to other viral communicable diseases: Secondary | ICD-10-CM | POA: Diagnosis present

## 2019-03-05 DIAGNOSIS — K589 Irritable bowel syndrome without diarrhea: Secondary | ICD-10-CM | POA: Diagnosis present

## 2019-03-05 DIAGNOSIS — F333 Major depressive disorder, recurrent, severe with psychotic symptoms: Principal | ICD-10-CM | POA: Diagnosis present

## 2019-03-05 DIAGNOSIS — G4700 Insomnia, unspecified: Secondary | ICD-10-CM | POA: Diagnosis present

## 2019-03-05 DIAGNOSIS — Z886 Allergy status to analgesic agent status: Secondary | ICD-10-CM | POA: Diagnosis not present

## 2019-03-05 LAB — SARS CORONAVIRUS 2 BY RT PCR (HOSPITAL ORDER, PERFORMED IN ~~LOC~~ HOSPITAL LAB): SARS Coronavirus 2: NEGATIVE

## 2019-03-05 MED ORDER — TRAZODONE HCL 50 MG PO TABS
50.0000 mg | ORAL_TABLET | Freq: Every evening | ORAL | Status: DC | PRN
  Administered 2019-03-06 (×2): 50 mg via ORAL
  Filled 2019-03-05 (×3): qty 1
  Filled 2019-03-05: qty 7

## 2019-03-05 MED ORDER — MAGNESIUM HYDROXIDE 400 MG/5ML PO SUSP: 30.0000 mL | Freq: Every day | ORAL | Status: DC | PRN

## 2019-03-05 MED ORDER — ALUM & MAG HYDROXIDE-SIMETH 200-200-20 MG/5ML PO SUSP: 30.0000 mL | ORAL | Status: DC | PRN

## 2019-03-05 MED ORDER — HYDROXYZINE HCL 25 MG PO TABS
25.0000 mg | ORAL_TABLET | Freq: Three times a day (TID) | ORAL | Status: DC | PRN
  Administered 2019-03-06 – 2019-03-08 (×3): 25 mg via ORAL
  Filled 2019-03-05 (×3): qty 1

## 2019-03-05 NOTE — BH Assessment (Signed)
Assessment Note  Kathleen Key is an 53 y.o. female presenting voluntarily to [facility] for assessment. Patient is accompanied by her husband, Aaron Edelman, who waits in the lobby during assessment then joins later to provide collateral. Patient reports "I feel like I live in an alternate world" but is unable to further explain this statement. Patient is guarded and renders limited history. She mostly provides single word answers or none at all. Patient reports current SI without specific plan but states "I'm thinking of making one." She denies HI, AVH, self-harming behavior, or any prior attempts or psychiatric hospitalizations. Patient denies any substance use. She sees Dr. Tawanna Cooler every 2 months for med management.   Per husband, Aaron Edelman: Patient has always been "forgetful" but since Tuesday has seemed disoriented and forgets to eat. She only sleeps a few hours a night. Patient recently started a new job that is stressful.  Patient is alert and oriented x 4. Her speech is soft and slow, eye contact is poor, and it is difficult to assess thought content as she does not answer many questions. Her mood is anxious and he affect is congruent. She fidgets during assessment. Her insight, judgement, and impulse control are impaired. She does not appear to be responding to internal stimuli or experiencing delusional thought content,  Diagnosis: F33.2 MDD, recurrent, severe  Past Medical History:  Past Medical History:  Diagnosis Date  . Asthma   . Fibroids    uterine  . IBS (irritable bowel syndrome)   . Pulmonary sarcoidosis ([place])   . Shortness of breath dyspnea     Past Surgical History:  Procedure Laterality Date  . ABDOMINAL HYSTERECTOMY N/A 10/13/2012   Procedure: HYSTERECTOMY ABDOMINAL;  Surgeon: Jonnie Kind, MD;  Location: [facility];  Service: Gynecology;  Laterality: N/A;  . BILATERAL SALPINGECTOMY Bilateral 10/13/2012   Procedure: BILATERAL SALPINGECTOMY;  Surgeon: Jonnie Kind, MD;  Location: [facility];  Service: Gynecology;  Laterality: Bilateral;  . BREAST BIOPSY Left 05/2015  . BREAST EXCISIONAL BIOPSY Left 05/2015  . BREAST LUMPECTOMY WITH RADIOACTIVE SEED LOCALIZATION Left 07/28/2015   Procedure: LEFT BREAST LUMPECTOMY WITH RADIOACTIVE SEED LOCALIZATION;  Surgeon: Autumn Messing III, MD;  Location: [place];  Service: General;  Laterality: Left;  . WISDOM TOOTH EXTRACTION     Dr. Georgina Snell office-[facility], [place], [place]    Family History:  Family History  Problem Relation Age of Onset  . Heart disease Mother        leaky valve  . Asthma Mother   . Hypertension Father        pacemaker  . Stroke Father   . Heart disease Father        pacemaker  . Fibromyalgia Sister   . Neuropathy Sister     Social History:  reports that she has never smoked. She has never used smokeless tobacco. She reports current alcohol use. She reports that she does not use drugs.  Additional Social History:  Alcohol / Drug Use Pain Medications: see MAR Prescriptions: see MAR Over the Counter: see MAR History of alcohol / drug use?: No history of alcohol / drug abuse  CIWA:   COWS:    Allergies:  Allergies  Allergen Reactions  . Dilaudid [Hydromorphone Hcl] Itching  . Percocet [Oxycodone-Acetaminophen] Itching  . Excedrin Back & [Acetaminophen-Aspirin Buffered] Other (See Comments)    SUGAR-SPIKE, NERVOUSNESS    Home Medications: (Not in a hospital admission)   OB/GYN Status:  Patient's last menstrual period was 10/08/2012.  General Assessment Data Location of Assessment: [facility] Assessment Services TTS Assessment: In system Is this a Tele or Face-to-Face Assessment?: Face-to-Face Is this an Initial Assessment or a Re-assessment for this encounter?: Initial Assessment Patient Accompanied by:: (husband, Aaron Edelman) Language Other than English: No Living Arrangements: (her home) What gender do you identify as?: Female Marital status: Married Pregnancy Status:  No Living Arrangements: Spouse/significant other Can pt return to current living arrangement?: Yes Admission Status: Voluntary Is patient capable of signing voluntary admission?: Yes Referral Source: Geophysical data processor type: None     Crisis Care Plan Living Arrangements: Spouse/significant other Legal Guardian: (self) Name of Psychiatrist: Dr. Amalia Greenhouse Name of Therapist: none  Education Status Is patient currently in school?: No Is the patient employed, unemployed or receiving disability?: Employed  Risk to self with the past 6 months Suicidal Ideation: Yes-Currently Present Has patient been a risk to self within the past 6 months prior to admission? : Yes Suicidal Intent: Yes-Currently Present Has patient had any suicidal intent within the past 6 months prior to admission? : Yes Is patient at risk for suicide?: Yes Suicidal Plan?: No Has patient had any suicidal plan within the past 6 months prior to admission? : No Access to Means: Yes Specify Access to Suicidal Means: can complete What has been your use of drugs/alcohol within the last 12 months?: denies Previous Attempts/Gestures: No How many times?: 0 Other Self Harm Risks: none Triggers for Past Attempts: None known Intentional Self Injurious Behavior: None Family Suicide History: No Recent stressful life event(s): Other (Comment)(new job) Persecutory voices/beliefs?: No Depression: Yes Depression Symptoms: Despondent, Insomnia, Tearfulness, Isolating, Guilt, Fatigue, Feeling worthless/self pity, Loss of interest in usual pleasures, Feeling angry/irritable Substance abuse history and/or treatment for substance abuse?: No Suicide prevention information given to non-admitted patients: Not applicable  Risk to Others within the past 6 months Homicidal Ideation: No Does patient have any lifetime risk of violence toward others beyond the six months prior to admission? : No Thoughts of Harm to Others: No Current  Homicidal Intent: No Current Homicidal Plan: No Access to Homicidal Means: No Identified Victim: none History of harm to others?: No Assessment of Violence: None Noted Violent Behavior Description: none Does patient have access to weapons?: No Criminal Charges Pending?: No Does patient have a court date: No Is patient on probation?: No  Psychosis Hallucinations: None noted Delusions: None noted  Mental Status Report Appearance/Hygiene: Unremarkable Eye Contact: Poor Motor Activity: Restlessness Speech: Soft, Slow Level of Consciousness: Quiet/awake Mood: Depressed, Anxious Affect: Anxious, Depressed Anxiety Level: Moderate Thought Processes: Unable to Assess Judgement: Impaired Orientation: Person, Place, Time, Situation Obsessive Compulsive Thoughts/Behaviors: None  Cognitive Functioning Concentration: Decreased Memory: Unable to Assess Is patient IDD: No Insight: Poor Impulse Control: Unable to Assess Appetite: Good Have you had any weight changes? : No Change Sleep: No Change Total Hours of Sleep: 5 Vegetative Symptoms: None  ADLScreening [facility] Assessment Services) Patient's cognitive ability adequate to safely complete daily activities?: Yes Patient able to express need for assistance with ADLs?: Yes Independently performs ADLs?: Yes (appropriate for developmental age)  Prior Inpatient Therapy Prior Inpatient Therapy: No  Prior Outpatient Therapy Prior Outpatient Therapy: Yes Prior Therapy Dates: ongoing Prior Therapy Facilty/Provider(s): [facility] Reason for Treatment: depression Does patient have an ACCT team?: No Does patient have Intensive In-House Services?  : No Does patient have [facility] services? : No Does patient have P4CC services?: No  ADL Screening (condition at time of admission) Patient's cognitive ability adequate to safely complete  daily activities?: Yes Is the patient deaf or have difficulty hearing?: No Does the patient have  difficulty seeing, even when wearing glasses/contacts?: No Does the patient have difficulty concentrating, remembering, or making decisions?: No Patient able to express need for assistance with ADLs?: Yes Does the patient have difficulty dressing or bathing?: No Independently performs ADLs?: Yes (appropriate for developmental age) Does the patient have difficulty walking or climbing stairs?: No Weakness of Legs: None Weakness of Arms/Hands: None  Home Assistive Devices/Equipment Home Assistive Devices/Equipment: None  Therapy Consults (therapy consults require a physician order) PT Evaluation Needed: No OT Evalulation Needed: No SLP Evaluation Needed: No Abuse/Neglect Assessment (Assessment to be complete while patient is alone) Abuse/Neglect Assessment Can Be Completed: Yes Physical Abuse: Denies Verbal Abuse: Denies Sexual Abuse: Denies Exploitation of patient/patient's resources: Denies Self-Neglect: Denies Values / Beliefs Cultural Requests During Hospitalization: None Spiritual Requests During Hospitalization: None Consults Spiritual Care Consult Needed: No Social Work Consult Needed: No Regulatory affairs officer (For Healthcare) Does Patient Have a Medical Advance Directive?: No Would patient like information on creating a medical advance directive?: No - Patient declined          Disposition:  Disposition Initial Assessment Completed for this Encounter: Yes  On Site Evaluation by:   Reviewed with Physician:    Orvis Brill 03/05/2019 1:56 PM

## 2019-03-05 NOTE — Progress Notes (Signed)
Patient ID: Kathleen Key, female   DOB: 1965-09-13, 53 y.o.   MRN: [id number] Pt was brought in by husband d/t a catatonic-like state. Pt wouldn't eat and wouldn't talk to him. Pt is cooperative during assessment, but slow to comply as if she is thought blocking. Pt seems to be responding to internal stimuli. Pt was asked to remove clothing for skin search but just stood there. This Probation officer had to assist with removal of clothing in the presence of another Therapist, sports. Pt slow to respond to questions and was demonstrating that she was responding to internal stimuli. Pt endorses SI and AVH. Pt denies HI. Pt has access to weapons. Pt states that she was shooting guns yesterday and had a thought that she would shoot herself. Pt stated that thoughts of her parents stopped her from acting on any thoughts. Pt states that she hears voices that tell her to do things like brush her teeth but are not commanding her to do "bad" things. She is seeing people, most relatives. She lives with her spouse and he is her main support. She denies talking to him about her problem because she doesn't talk to anyone about how she is feeling. Pt says that she is confused, worried and upset. Confusion is evident in some answers to questions (says she is menstruating when chart says she has had hysterectomy; denies medication allergies when allergies are listed, etc.). Pt says that she relives every day at the end of the day and wants it to stop. She recently started a new job which has been stressful as well as her husband being hospitalized for COVID for five days this year. While she is here, she wants to work on not reliving each day all the time.

## 2019-03-05 NOTE — H&P (Signed)
Behavioral Health Medical Screening Exam  Kathleen Key is an 53 y.o. female with depressive symptoms and psychosis. Patient is a level 5 caveat due to psychiatric illness. She does apepar to be thought blocking and responding to internal stimuli. She is observed picking at things off her shoe and skin. Several times during the evaluation she states that she keeps seeing her daddy, aunt, and other relatives whom are deceased. She also asked Probation officer ' why are there two of you? Why everything you say is the number 2? What is 2? " patient also originally denied suicide attempt, and then exhibited thought blocking so I clarified with patient again and she responds "yes I did 1 or 2 times. What did you say about the number 2? I cut my wrist before. " she denies suicidal ideation and homicidal ideation as well as hallucinations. However at the beginning of the evaluation she states she is seeing her loved ones every day and she wants to know why? "    Total Time spent with patient: 30 minutes  Psychiatric Specialty Exam: Physical Exam  ROS  Last menstrual period 10/08/2012.There is no height or weight on file to calculate BMI.  General Appearance: Fairly Groomed  Eye Contact:  Fair  Speech:  Clear and Coherent and Normal Rate  Volume:  Normal  Mood:  Depressed  Affect:  Depressed and Flat  Thought Process:  Disorganized, Irrelevant and Descriptions of Associations: Intact  Orientation:  Other:  alert x 2  Thought Content:  Illogical, Hallucinations: Auditory Visual, Paranoid Ideation and Rumination  Suicidal Thoughts:  No  Homicidal Thoughts:  No  Memory:  Immediate;   Poor Recent;   Poor  Judgement:  Poor  Insight:  Lacking  Psychomotor Activity:  Increased and Restlessness  Concentration: Concentration: Poor and Attention Span: Fair  Recall:  Poor  Fund of Knowledge:Poor  Language: Fair  Akathisia:  Negative  Handed:  Right  AIMS (if indicated):     Assets:  Communication Skills Desire  for Improvement Financial Resources/Insurance Leisure Time Physical Health Social Support Vocational/Educational  Sleep:       Musculoskeletal: Strength & Muscle Tone: within normal limits Gait & Station: normal Patient leans: N/A  Last menstrual period 10/08/2012.  Recommendations:  Based on my evaluation the patient does not appear to have an emergency medical condition. will recommend admission to the unit once COVID test results. Will start home medications and add antipsychotic to augment antidepressant. Patient with new onset psychosis will benefit from SGA. COVID testing is pending. Chart review shows that she is taking Zoloft 200mg  po daily, lorazepam prn, trazodone 50mg  at [facility], and Adderall. She was also given a prescription for Maydays which was too expensive. adderall XR was recently increased to 40mg  po daily on 02/18/2019, may need to consider reducing the dose as this maybe worsening the psychosis.   Suella Broad, FNP 03/05/2019, 2:35 PM

## 2019-03-06 ENCOUNTER — Encounter (HOSPITAL_COMMUNITY): Payer: Self-pay

## 2019-03-06 DIAGNOSIS — F333 Major depressive disorder, recurrent, severe with psychotic symptoms: Secondary | ICD-10-CM

## 2019-03-06 MED ORDER — ENSURE ENLIVE PO LIQD
237.0000 mL | Freq: Two times a day (BID) | ORAL | Status: DC
  Administered 2019-03-06 – 2019-03-12 (×10): 237 mL via ORAL

## 2019-03-06 MED ORDER — RISPERIDONE 1 MG PO TABS
1.0000 mg | ORAL_TABLET | Freq: Two times a day (BID) | ORAL | Status: DC
  Filled 2019-03-06 (×3): qty 1

## 2019-03-06 MED ORDER — LORAZEPAM 1 MG PO TABS: 1.0000 mg | ORAL_TABLET | Freq: Four times a day (QID) | ORAL | Status: DC | PRN

## 2019-03-06 MED ORDER — SERTRALINE HCL 100 MG PO TABS
200.0000 mg | ORAL_TABLET | Freq: Every day | ORAL | Status: DC
  Administered 2019-03-06 – 2019-03-12 (×7): 200 mg via ORAL
  Filled 2019-03-06 (×7): qty 2
  Filled 2019-03-06: qty 4
  Filled 2019-03-06 (×2): qty 2

## 2019-03-06 MED ORDER — HALOPERIDOL LACTATE 5 MG/ML IJ SOLN
5.0000 mg | Freq: Two times a day (BID) | INTRAMUSCULAR | Status: DC
  Administered 2019-03-06 – 2019-03-12 (×2): 5 mg via INTRAMUSCULAR
  Filled 2019-03-06 (×16): qty 1

## 2019-03-06 MED ORDER — RISPERIDONE 1 MG PO TBDP
1.0000 mg | ORAL_TABLET | Freq: Two times a day (BID) | ORAL | Status: DC
  Administered 2019-03-06 – 2019-03-07 (×3): 1 mg via ORAL
  Filled 2019-03-06 (×6): qty 1

## 2019-03-06 MED ORDER — ALBUTEROL SULFATE HFA 108 (90 BASE) MCG/ACT IN AERS: 1.0000 | INHALATION_SPRAY | Freq: Four times a day (QID) | RESPIRATORY_TRACT | Status: DC | PRN

## 2019-03-06 NOTE — Progress Notes (Signed)
Patient's husband, Caryle Lichlyter, called to inquire about patient. Pt would like to speak with Dr. Whenever it is convenient. Contact number [phone]

## 2019-03-06 NOTE — Progress Notes (Addendum)
D. Pt presents as flat, appears confused, and is slow to respond as if she is thought blocking. Pt does endorse AH, but is mute when asked to elaborate. Pt currently denies SI/HI  Pt took a few bites of lunch, and drank all of her Ensure with encouragement. A. Labs and vitals monitored. Pt compliant with medications. Pt provided with Gatorade and encouraged to drink. Pt supported emotionally and encouraged to express concerns and ask questions.   R. Pt remains safe with 15 minute checks. Will continue POC.

## 2019-03-06 NOTE — Progress Notes (Signed)
Patient refused lab draw this evening- Per MHT, pt didn't cooperate,  and "seemed out of it".

## 2019-03-06 NOTE — Progress Notes (Signed)
Patient ID: Kathleen Key, female   DOB: 01/28/1966, 53 y.o.   MRN: [id number] Admission Note:  53 yr female who presents [facility] in no acute distress for the treatment of psychosis.  Per: obs admission note: Pt was brought in by husband d/t a catatonic-like state. Pt wouldn't eat and wouldn't talk to him. Pt is cooperative during assessment, but slow to comply as if she is thought blocking. Pt seems to be responding to internal stimuli. Pt was asked to remove clothing for skin search but just stood there. This Probation officer had to assist with removal of clothing in the presence of another Therapist, sports. Pt slow to respond to questions and was demonstrating that she was responding to internal stimuli. Pt endorses SI and AVH. Pt denies HI. Pt has access to weapons. Pt states that she was shooting guns yesterday and had a thought that she would shoot herself. Pt stated that thoughts of her parents stopped her from acting on any thoughts. Pt states that she hears voices that tell her to do things like brush her teeth but are not commanding her to do "bad" things. She is seeing people, most relatives. She lives with her spouse and he is her main support. She denies talking to him about her problem because she doesn't talk to anyone about how she is feeling. Pt says that she is confused, worried and upset. Confusion is evident in some answers to questions (says she is menstruating when chart says she has had hysterectomy; denies medication allergies when allergies are listed, etc.). Pt says that she relives every day at the end of the day and wants it to stop. She recently started a new job which has been stressful as well as her husband being hospitalized for COVID for five days this year. While she is here, she wants to work on not reliving each day all the time.  Pt was brought to the unit and admission finished from observation documentation, but had problems processing. Pt was placed in the quiet room  due to not appearing to be able  to tolerate a roommate at this time. Pt appeared confused at times, disoriented , pt oriented x 0 , but could remember that she took Melatonin at night. Pt given Trazodone and Vistaril per [facility]

## 2019-03-06 NOTE — Tx Team (Signed)
Initial Treatment Plan 03/06/2019 1:36 AM MARICELLA TRIVETT [id number]    PATIENT STRESSORS: Health problems Medication change or noncompliance   PATIENT STRENGTHS: General fund of knowledge Motivation for treatment/growth   PATIENT IDENTIFIED PROBLEMS:  psychosis  "nothing"   disorientation                 DISCHARGE CRITERIA:  Improved stabilization in mood, thinking, and/or behavior Verbal commitment to aftercare and medication compliance  PRELIMINARY DISCHARGE PLAN: Attend PHP/IOP Outpatient therapy  PATIENT/FAMILY INVOLVEMENT: This treatment plan has been presented to and reviewed with the patient, Suheyla B Mazariego.  The patient and family have been given the opportunity to ask questions and make suggestions.  Providence Crosby, RN 03/06/2019, 1:36 AM

## 2019-03-06 NOTE — BHH Suicide Risk Assessment (Signed)
[facility] Admission Suicide Risk Assessment   Nursing information obtained from:  Patient Demographic factors:  Caucasian, Access to firearms Current Mental Status:  Suicidal ideation indicated by patient Loss Factors:  Loss of significant relationship(father passed 2 years ago) Historical Factors:  NA Risk Reduction Factors:  Positive social support, Employed, Sense of responsibility to family, Living with another person, especially a relative  Total Time spent with patient: 30 minutes Principal Problem: <principal problem not specified> Diagnosis:  Active Problems:   Severe recurrent major depression with psychotic features ([place])  Subjective Data: Patient is seen and examined.  Patient is a 53 year old female with a past psychiatric history significant for depression and attention deficit disorder who presented as a walk-in assessment to the behavioral health hospital on 03/05/2019.  The patient was brought in by her husband.  The patient during the interview today is essentially mute.  Review of the electronic medical record stated that she voiced to the is initial assessment that she "lives in an alternate world".  The husband stated she had always been forgetful, but since the Tuesday prior to admission she appeared to be disoriented and would not eat.  She was only sleeping a few hours a night.  She had reportedly started a new job that was stressful.  During the interview today she is essentially mute.  Her eye contact is minimal.  She is psychomotor agitated to a degree with fidgeting.  It does appear that she may be responding to internal stimuli at this time.  Review of the electronic medical record revealed that she has been followed by Dr Montel Culver for attention deficit disorder, generalized anxiety disorder and major depression.  It appears that his first visit with her was approximately January of this year.  She had a previous history of depression and had been treated with fluoxetine as well  as been a plaque seen in the past.  She had been on sertraline since sometime in 2019.  She does have a history of suicidal thoughts at that time, but had never attempted suicide.  In that note there was no evidence that she had a history of mania, psychosis, substance abuse issues.  She was started on stimulants at that time.  She was being treated with sertraline 200 mg p.o. daily, and that was continued.  Unfortunately we have no laboratory data on her whatsoever.  She was admitted to the hospital for evaluation and stabilization.  Her blood pressure is elevated at 154/82.  She is afebrile.  Her weight today is 58.1514 kg.  Her BMI is 22.49.  She only slept 4.25 hours last night.  Continued Clinical Symptoms:  Alcohol Use Disorder Identification Test Final Score (AUDIT): 4 The "Alcohol Use Disorders Identification Test", Guidelines for Use in Primary Care, Second Edition.  World Pharmacologist [facility]). Score between 0-7:  no or low risk or alcohol related problems. Score between 8-15:  moderate risk of alcohol related problems. Score between 16-19:  high risk of alcohol related problems. Score 20 or above:  warrants further diagnostic evaluation for alcohol dependence and treatment.   CLINICAL FACTORS:   Severe Anxiety and/or Agitation Depression:   Delusional Hopelessness Insomnia Currently Psychotic   Musculoskeletal: Strength & Muscle Tone: within normal limits Gait & Station: normal Patient leans: N/A  Psychiatric Specialty Exam: Physical Exam  Nursing note and vitals reviewed. Constitutional: She appears well-developed.  HENT:  Head: Normocephalic and atraumatic.  Respiratory: Effort normal.  Neurological: She is alert.    ROS  Blood  pressure (!) 154/82, pulse 78, temperature 98 F (36.7 C), temperature source Oral, resp. rate 18, height 5' 3.5" (1.613 m), weight 58.5 kg, last menstrual period 10/08/2012, SpO2 100 %.Body mass index is 22.49 kg/m.  General Appearance:  Disheveled  Eye Contact:  Minimal  Speech:  Essentially mute  Volume:  Essentially mute  Mood:  Anxious, Depressed and Dysphoric  Affect:  Blunt  Thought Process:  NA  Orientation:  NA  Thought Content:  Delusions and Hallucinations: She does appear to be paranoid and responding to internal stimuli.  Suicidal Thoughts:  No  Homicidal Thoughts:  No  Memory:  Immediate;   Poor Recent;   Poor Remote;   Poor  Judgement:  Impaired  Insight:  Shallow  Psychomotor Activity:  Increased  Concentration:  Concentration: Poor and Attention Span: Poor  Recall:  Poor  Fund of Knowledge:  Poor  Language:  Poor  Akathisia:  Negative  Handed:  Right  AIMS (if indicated):     Assets:  Resilience  ADL's:  Impaired  Cognition:  WNL  Sleep:  Number of Hours: 4.25      COGNITIVE FEATURES THAT CONTRIBUTE TO RISK:  Thought constriction (tunnel vision)    SUICIDE RISK:   Moderate:  Frequent suicidal ideation with limited intensity, and duration, some specificity in terms of plans, no associated intent, good self-control, limited dysphoria/symptomatology, some risk factors present, and identifiable protective factors, including available and accessible social support.  PLAN OF CARE: Patient is seen and examined.  Patient is a 53 year old female with a past psychiatric history significant for major depression, generalized anxiety and attention deficit disorder.  She was admitted with worsening depression, appearing to be psychotic, not eating, not drinking and not sleeping.  She was most recently seen on 11/27 and she was continue her medications at that time.  There is no evidence of mania or psychosis at that time as well.  It is unclear whether or not she is compliant with her Zoloft, or whether she may have overtaken her stimulants.  We do not have any laboratories on her, so I am unsure about any potential abnormalities of her electrolytes given her lack of eating and drinking.  We will get those  done today.  Additionally we will check other laboratories including urinalysis and thyroid function studies.  In the meantime we will continue her Zoloft at 200 mg p.o. daily.  I am going to start Risperdal 1 mg p.o. twice daily for what I am concerned about psychotic depression.  We will also get a drug screen and make sure about no abuse.  She does have a history of sarcoid, so we will have to look at her EKG.  Although not really reliable we will get an ACE level on her as well as sedimentation rate to make sure about any inflammatory process from her sarcoid.  Again, this is most likely psychotic depression and I will send a message to Dr. Weber Cooks at [facility] for consideration of ECT given the severity of her illness.  If her eating and drinking remains significantly abnormal we may need to consider sending her to [facility] for IV fluids and evaluation.  I certify that inpatient services furnished can reasonably be expected to improve the patient's condition.   Sharma Covert, MD 03/06/2019, 9:06 AM

## 2019-03-06 NOTE — Progress Notes (Signed)
Spoke with patient's husband ,Liliany Withington, who called back after speaking with the Dr.. Mr. Mayerson wanted to relay the message that after checking pt's medications at home (Adderall and Zoloft) and counting tablets,  there doesn't appear to be any evidence that pt was taking more than the prescribed amount.

## 2019-03-06 NOTE — H&P (Signed)
Psychiatric Admission Assessment Adult  Patient Identification: Kathleen Key MRN:  [id number] Date of Evaluation:  03/06/2019 Chief Complaint:  MDD With Psychosis Principal Diagnosis: <principal problem not specified> Diagnosis:  Active Problems:   Severe recurrent major depression with psychotic features ([place])  History of Present Illness: Patient is seen and examined.  Patient is a 53 year old female with a past psychiatric history significant for depression and attention deficit disorder who presented as a walk-in assessment to the behavioral health hospital on 03/05/2019.  The patient was brought in by her husband.  The patient during the interview today is essentially mute.  Review of the electronic medical record stated that she voiced to the is initial assessment that she "lives in an alternate world".  The husband stated she had always been forgetful, but since the Tuesday prior to admission she appeared to be disoriented and would not eat.  She was only sleeping a few hours a night.  She had reportedly started a new job that was stressful.  During the interview today she is essentially mute.  Her eye contact is minimal.  She is psychomotor agitated to a degree with fidgeting.  It does appear that she may be responding to internal stimuli at this time.  Review of the electronic medical record revealed that she has been followed by Dr Montel Culver for attention deficit disorder, generalized anxiety disorder and major depression.  It appears that his first visit with her was approximately January of this year.  She had a previous history of depression and had been treated with fluoxetine as well as been a plaque seen in the past.  She had been on sertraline since sometime in 2019.  She does have a history of suicidal thoughts at that time, but had never attempted suicide.  In that note there was no evidence that she had a history of mania, psychosis, substance abuse issues.  She was started on stimulants  at that time.  She was being treated with sertraline 200 mg p.o. daily, and that was continued.  Unfortunately we have no laboratory data on her whatsoever.  She was admitted to the hospital for evaluation and stabilization.  Her blood pressure is elevated at 154/82.  She is afebrile.  Her weight today is 58.1514 kg.  Her BMI is 22.49.  She only slept 4.25 hours last night.  Associated Signs/Symptoms: Depression Symptoms:  depressed mood, anhedonia, insomnia, psychomotor agitation, fatigue, feelings of worthlessness/guilt, difficulty concentrating, hopelessness, anxiety, loss of energy/fatigue, disturbed sleep, (Hypo) Manic Symptoms:  Hallucinations, Anxiety Symptoms:  Excessive Worry, Psychotic Symptoms:  Delusions, Hallucinations: Auditory Paranoia, PTSD Symptoms: Negative Total Time spent with patient: 45 minutes  Past Psychiatric History: She has been followed locally, and been diagnosed with depression, anxiety and attention deficit disorder.  Her most recent medications included stimulants, sertraline.  Previously treated with fluoxetine.  She has a reported history of suicidal thoughts, but is never attempted to harm her self.  Is the patient at risk to self? Yes.    Has the patient been a risk to self in the past 6 months? No.  Has the patient been a risk to self within the distant past? No.  Is the patient a risk to others? No.  Has the patient been a risk to others in the past 6 months? No.  Has the patient been a risk to others within the distant past? No.   Prior Inpatient Therapy: Prior Inpatient Therapy: No Prior Outpatient Therapy: Prior Outpatient Therapy: Yes Prior Therapy Dates:  ongoing Prior Therapy Facilty/Provider(s): [facility] Reason for Treatment: depression Does patient have an ACCT team?: No Does patient have Intensive In-House Services?  : No Does patient have [facility] services? : No Does patient have P4CC services?: No  Alcohol Screening: 1.  How often do you have a drink containing alcohol?: 2 to 3 times a week 2. How many drinks containing alcohol do you have on a typical day when you are drinking?: 1 or 2(2-3 glasses of wine) 3. How often do you have six or more drinks on one occasion?: Never AUDIT-C Score: 3 4. How often during the last year have you found that you were not able to stop drinking once you had started?: Never 5. How often during the last year have you failed to do what was normally expected from you becasue of drinking?: Never 6. How often during the last year have you needed a first drink in the morning to get yourself going after a heavy drinking session?: Less than monthly 7. How often during the last year have you had a feeling of guilt of remorse after drinking?: Never 8. How often during the last year have you been unable to remember what happened the night before because you had been drinking?: Never 9. Have you or someone else been injured as a result of your drinking?: No 10. Has a relative or friend or a doctor or another health worker been concerned about your drinking or suggested you cut down?: No Alcohol Use Disorder Identification Test Final Score (AUDIT): 4 Substance Abuse History in the last 12 months:  No. Consequences of Substance Abuse: Negative Previous Psychotropic Medications: Yes  Psychological Evaluations: Yes  Past Medical History:  Past Medical History:  Diagnosis Date  . Asthma   . Fibroids    uterine  . IBS (irritable bowel syndrome)   . Pulmonary sarcoidosis ([place])   . Shortness of breath dyspnea     Past Surgical History:  Procedure Laterality Date  . ABDOMINAL HYSTERECTOMY N/A 10/13/2012   Procedure: HYSTERECTOMY ABDOMINAL;  Surgeon: Jonnie Kind, MD;  Location: [facility];  Service: Gynecology;  Laterality: N/A;  . BILATERAL SALPINGECTOMY Bilateral 10/13/2012   Procedure: BILATERAL SALPINGECTOMY;  Surgeon: Jonnie Kind, MD;  Location: [facility];  Service: Gynecology;   Laterality: Bilateral;  . BREAST BIOPSY Left 05/2015  . BREAST EXCISIONAL BIOPSY Left 05/2015  . BREAST LUMPECTOMY WITH RADIOACTIVE SEED LOCALIZATION Left 07/28/2015   Procedure: LEFT BREAST LUMPECTOMY WITH RADIOACTIVE SEED LOCALIZATION;  Surgeon: Autumn Messing III, MD;  Location: [facility];  Service: General;  Laterality: Left;  . WISDOM TOOTH EXTRACTION     Dr. Georgina Snell office-[facility], [place], [place]   Family History:  Family History  Problem Relation Age of Onset  . Heart disease Mother        leaky valve  . Asthma Mother   . Hypertension Father        pacemaker  . Stroke Father   . Heart disease Father        pacemaker  . Fibromyalgia Sister   . Neuropathy Sister    Family Psychiatric  History: Unknown Tobacco Screening: Have you used any form of tobacco in the last 30 days? (Cigarettes, Smokeless Tobacco, Cigars, and/or Pipes): No Social History:  Social History   Substance and Sexual Activity  Alcohol Use Yes   Comment: occassional     Social History   Substance and Sexual Activity  Drug Use No    Additional Social History:  Marital status: Married    Pain Medications: see MAR Prescriptions: see MAR Over the Counter: see MAR History of alcohol / drug use?: No history of alcohol / drug abuse                    Allergies:   Allergies  Allergen Reactions  . Dilaudid [Hydromorphone Hcl] Itching  . Percocet [Oxycodone-Acetaminophen] Itching  . Excedrin Back & [Acetaminophen-Aspirin Buffered] Other (See Comments)    SUGAR-SPIKE, NERVOUSNESS   Lab Results:  Results for orders placed or performed during the hospital encounter of 03/05/19 (from the past 48 hour(s))  SARS Coronavirus 2 by RT PCR (hospital order, performed in [facility] hospital lab) Nasopharyngeal Nasopharyngeal Swab     Status: None   Collection Time: 03/05/19  3:50 PM   Specimen: Nasopharyngeal Swab  Result Value Ref Range   SARS Coronavirus 2 NEGATIVE NEGATIVE     Comment: (NOTE) SARS-CoV-2 target nucleic acids are NOT DETECTED. The SARS-CoV-2 RNA is generally detectable in upper and lower respiratory specimens during the acute phase of infection. The lowest concentration of SARS-CoV-2 viral copies this assay can detect is 250 copies / mL. A negative result does not preclude SARS-CoV-2 infection and should not be used as the sole basis for treatment or other patient management decisions.  A negative result may occur with improper specimen collection / handling, submission of specimen other than nasopharyngeal swab, presence of viral mutation(s) within the areas targeted by this assay, and inadequate number of viral copies (<250 copies / mL). A negative result must be combined with clinical observations, patient history, and epidemiological information. Fact Sheet for Patients:   StrictlyIdeas.no Fact Sheet for Healthcare Providers: BankingDealers.co.za This test is not yet approved or cleared  by the [place] FDA and has been authorized for detection and/or diagnosis of SARS-CoV-2 by FDA under an Emergency Use Authorization (EUA).  This EUA will remain in effect (meaning this test can be used) for the duration of the COVID-19 declaration under Section 564(b)(1) of the Act, 21 U.S.C. section 360bbb-3(b)(1), unless the authorization is terminated or revoked sooner. Performed at [facility], [place] [address], [place], [place] [address]     Blood Alcohol level:  No results found for: [facility]  Metabolic Disorder Labs:  No results found for: HGBA1C, MPG No results found for: PROLACTIN No results found for: CHOL, TRIG, HDL, CHOLHDL, VLDL, LDLCALC  Current Medications: Current Facility-Administered Medications  Medication Dose Route Frequency Provider Last Rate Last Dose  . albuterol (VENTOLIN HFA) 108 (90 Base) MCG/ACT inhaler 1-2 puff  1-2 puff Inhalation Q6H PRN Sharma Covert, MD      . alum & mag hydroxide-simeth (MAALOX/MYLANTA) 200-200-20 MG/5ML suspension 30 mL  30 mL Oral Q4H PRN Lindon Romp A, NP      . feeding supplement (ENSURE ENLIVE) (ENSURE ENLIVE) liquid 237 mL  237 mL Oral BID BM Sharma Covert, MD      . hydrOXYzine (ATARAX/VISTARIL) tablet 25 mg  25 mg Oral TID PRN Rozetta Nunnery, NP   25 mg at 03/06/19 0103  . LORazepam (ATIVAN) tablet 1 mg  1 mg Oral Q6H PRN Sharma Covert, MD      . magnesium hydroxide (MILK OF MAGNESIA) suspension 30 mL  30 mL Oral Daily PRN Lindon Romp A, NP      . risperiDONE (RISPERDAL M-TABS) disintegrating tablet 1 mg  1 mg Oral BID Sharma Covert, MD      .  sertraline (ZOLOFT) tablet 200 mg  200 mg Oral Daily Sharma Covert, MD      . traZODone (DESYREL) tablet 50 mg  50 mg Oral QHS PRN Rozetta Nunnery, NP   50 mg at 03/06/19 0103   PTA Medications: Medications Prior to Admission  Medication Sig Dispense Refill Last Dose  . albuterol (VENTOLIN HFA) 108 (90 Base) MCG/ACT inhaler Inhale 2 puffs every 6 hours if needed for breathing 18 g 12 Past Month at Unknown time  . amphetamine-dextroamphetamine (ADDERALL XR) 20 MG 24 hr capsule Take 2 capsules (40 mg total) by mouth daily. 60 capsule 0 Past Month at Unknown time  . estradiol (ESTRACE) 1 MG tablet Take 1 mg by mouth daily.   Past Month at Unknown time  . LORazepam (ATIVAN) 0.5 MG tablet Take 1 tablet (0.5 mg total) by mouth 2 (two) times daily as needed for anxiety. 60 tablet 2 Past Month at Unknown time  . polyethylene glycol (MIRALAX / GLYCOLAX) packet Take 17 g by mouth 3 (three) times daily as needed. 14 each 0 Past Month at Unknown time  . sertraline (ZOLOFT) 100 MG tablet Take 2 tablets (200 mg total) by mouth daily. 60 tablet 0 Past Month at Unknown time    Musculoskeletal: Strength & Muscle Tone: within normal limits Gait & Station: normal Patient leans: N/A  Psychiatric Specialty Exam: Physical Exam  Nursing note and vitals  reviewed. Constitutional: She appears well-developed.  HENT:  Head: Normocephalic and atraumatic.  Respiratory: Effort normal.  Neurological: She is alert.    ROS  Blood pressure (!) 154/82, pulse 78, temperature 98 F (36.7 C), temperature source Oral, resp. rate 18, height 5' 3.5" (1.613 m), weight 58.5 kg, last menstrual period 10/08/2012, SpO2 100 %.Body mass index is 22.49 kg/m.  General Appearance: Disheveled  Eye Contact:  Minimal  Speech:  Essentially mute  Volume:  Essentially mute  Mood:  Anxious, Depressed and Dysphoric  Affect:  Constricted  Thought Process:  Goal Directed and Descriptions of Associations: Circumstantial  Orientation:  Negative  Thought Content:  Delusions and Hallucinations: Auditory  Suicidal Thoughts:  No  Homicidal Thoughts:  No  Memory:  Immediate;   Poor Recent;   Poor Remote;   Poor  Judgement:  Impaired  Insight:  Lacking  Psychomotor Activity:  Decreased  Concentration:  Concentration: Fair and Attention Span: Fair  Recall:  Poor  Fund of Knowledge:  Poor  Language:  Fair  Akathisia:  Negative  Handed:  Right  AIMS (if indicated):     Assets:  Desire for Improvement Resilience  ADL's:  Impaired  Cognition:  WNL  Sleep:  Number of Hours: 4.25    Treatment Plan Summary: Daily contact with patient to assess and evaluate symptoms and progress in treatment, Medication management and Plan :Patient is seen and examined.  Patient is a 53 year old female with a past psychiatric history significant for major depression, generalized anxiety and attention deficit disorder.  She was admitted with worsening depression, appearing to be psychotic, not eating, not drinking and not sleeping.  She was most recently seen on 11/27 and she was continue her medications at that time.  There is no evidence of mania or psychosis at that time as well.  It is unclear whether or not she is compliant with her Zoloft, or whether she may have overtaken her stimulants.   We do not have any laboratories on her, so I am unsure about any potential abnormalities of her electrolytes given her  lack of eating and drinking.  We will get those done today.  Additionally we will check other laboratories including urinalysis and thyroid function studies.  In the meantime we will continue her Zoloft at 200 mg p.o. daily.  I am going to start Risperdal 1 mg p.o. twice daily for what I am concerned about psychotic depression.  We will also get a drug screen and make sure about no abuse.  She does have a history of sarcoid, so we will have to look at her EKG.  Although not really reliable we will get an ACE level on her as well as sedimentation rate to make sure about any inflammatory process from her sarcoid.  Again, this is most likely psychotic depression and I will send a message to Dr. Weber Cooks at [facility] for consideration of ECT given the severity of her illness.  If her eating and drinking remains significantly abnormal we may need to consider sending her to [facility] for IV fluids and evaluation.  Observation Level/Precautions:  15 minute checks  Laboratory:  Chemistry Profile  Psychotherapy:    Medications:    Consultations:    Discharge Concerns:    Estimated LOS:  Other:     Physician Treatment Plan for Primary Diagnosis: <principal problem not specified> Long Term Goal(s): Improvement in symptoms so as ready for discharge  Short Term Goals: Ability to identify changes in lifestyle to reduce recurrence of condition will improve, Ability to verbalize feelings will improve, Ability to disclose and discuss suicidal ideas, Ability to demonstrate self-control will improve, Ability to identify and develop effective coping behaviors will improve and Ability to maintain clinical measurements within normal limits will improve  Physician Treatment Plan for Secondary Diagnosis: Active Problems:   Severe recurrent major depression with psychotic features  ([place])  Long Term Goal(s): Improvement in symptoms so as ready for discharge  Short Term Goals: Ability to identify changes in lifestyle to reduce recurrence of condition will improve, Ability to verbalize feelings will improve, Ability to disclose and discuss suicidal ideas, Ability to demonstrate self-control will improve, Ability to identify and develop effective coping behaviors will improve and Ability to maintain clinical measurements within normal limits will improve  I certify that inpatient services furnished can reasonably be expected to improve the patient's condition.    Sharma Covert, MD 12/5/202011:29 AM

## 2019-03-06 NOTE — Progress Notes (Signed)
Adult Psychoeducational Group Note  Date:  03/06/2019 Time:  11:48 PM  Group Topic/Focus:  Wrap-Up Group:   The focus of this group is to help patients review their daily goal of treatment and discuss progress on daily workbooks.  Participation Level:  Did Not Attend  Participation Quality:  Did Not Attend  Affect:  Did Not Attend  Cognitive:  Did Not Attend  Insight: None  Engagement in Group:  Did Not Attend  Modes of Intervention:  Did Not Attend  Additional Comments:  Pt did not attend evening wrap up group tonight.  Candy Sledge 03/06/2019, 11:48 PM

## 2019-03-06 NOTE — BHH Group Notes (Signed)
[place] Group Notes: (Clinical Social Work)   03/06/2019      Type of Therapy:  Group Therapy   Participation Level:  Did Not Attend - was invited both individually by MHT and by overhead announcement, chose not to attend.   Selmer Dominion, LCSW 03/06/2019, 1:33 PM

## 2019-03-06 NOTE — Progress Notes (Signed)
Pt's husband, Allyah Tesoriero, here to visit patient. Mr. Moldenhauer informed this nurse that patient has been taking 'Neurvia' OTC , and Lorazepam regularly, and believes that patient had been taking Lorazepam "more than usual", as he found the bottle in her purse.

## 2019-03-07 DIAGNOSIS — F333 Major depressive disorder, recurrent, severe with psychotic symptoms: Secondary | ICD-10-CM | POA: Diagnosis not present

## 2019-03-07 LAB — CBC
HCT: 42.8 % (ref 36.0–46.0)
Hemoglobin: 14 g/dL (ref 12.0–15.0)
MCH: 32.5 pg (ref 26.0–34.0)
MCHC: 32.7 g/dL (ref 30.0–36.0)
MCV: 99.3 fL (ref 80.0–100.0)
Platelets: 288 10*3/uL (ref 150–400)
RBC: 4.31 MIL/uL (ref 3.87–5.11)
RDW: 13.1 % (ref 11.5–15.5)
WBC: 5.8 10*3/uL (ref 4.0–10.5)
nRBC: 0 % (ref 0.0–0.2)

## 2019-03-07 LAB — COMPREHENSIVE METABOLIC PANEL
ALT: 22 U/L (ref 0–44)
AST: 29 U/L (ref 15–41)
Albumin: 3.8 g/dL (ref 3.5–5.0)
Alkaline Phosphatase: 54 U/L (ref 38–126)
Anion gap: 11 (ref 5–15)
BUN: 13 mg/dL (ref 6–20)
CO2: 27 mmol/L (ref 22–32)
Calcium: 9.1 mg/dL (ref 8.9–10.3)
Chloride: 102 mmol/L (ref 98–111)
Creatinine, Ser: 0.58 mg/dL (ref 0.44–1.00)
GFR calc Af Amer: >60 mL/min (ref >60)
GFR calc non Af Amer: >60 mL/min (ref >60)
Glucose, Bld: 97 mg/dL (ref 70–99)
Potassium: 3.3 mmol/L — ABNORMAL LOW (ref 3.5–5.1)
Sodium: 140 mmol/L (ref 135–145)
Total Bilirubin: 0.5 mg/dL (ref 0.3–1.2)
Total Protein: 6.9 g/dL (ref 6.5–8.1)

## 2019-03-07 LAB — LIPID PANEL
Cholesterol: 194 mg/dL (ref 0–200)
HDL: 74 mg/dL (ref >40)
LDL Cholesterol: 104 mg/dL — ABNORMAL HIGH (ref 0–99)
Total CHOL/HDL Ratio: 2.6 RATIO
Triglycerides: 78 mg/dL (ref <150)
VLDL: 16 mg/dL (ref 0–40)

## 2019-03-07 LAB — HEMOGLOBIN A1C
Hgb A1c MFr Bld: 5.6 % (ref 4.8–5.6)
Mean Plasma Glucose: 114.02 mg/dL

## 2019-03-07 LAB — SEDIMENTATION RATE: Sed Rate: 11 mm/hr (ref 0–22)

## 2019-03-07 LAB — TSH: TSH: 2.368 u[IU]/mL (ref 0.350–4.500)

## 2019-03-07 MED ORDER — RISPERIDONE 2 MG PO TBDP
2.0000 mg | ORAL_TABLET | Freq: Two times a day (BID) | ORAL | Status: DC
  Administered 2019-03-07 – 2019-03-09 (×4): 2 mg via ORAL
  Filled 2019-03-07 (×6): qty 1

## 2019-03-07 MED ORDER — LORAZEPAM 1 MG PO TABS
1.0000 mg | ORAL_TABLET | Freq: Two times a day (BID) | ORAL | Status: DC
  Administered 2019-03-07 – 2019-03-08 (×2): 1 mg via ORAL
  Filled 2019-03-07: qty 1

## 2019-03-07 MED ORDER — LORAZEPAM 1 MG PO TABS
1.0000 mg | ORAL_TABLET | Freq: Three times a day (TID) | ORAL | Status: DC | PRN
  Filled 2019-03-07 (×3): qty 1

## 2019-03-07 NOTE — Progress Notes (Signed)
Patients husband visited on tonight. Patient has been asleep since her husband left. She did not receive her hs medication because she was asleep. Safety maintained with 15 min checks.

## 2019-03-07 NOTE — BHH Group Notes (Signed)
[place] Group Notes: (Clinical Social Work)   03/07/2019      Type of Therapy:  Group Therapy   Participation Level:  Did Not Attend - was invited both individually by MHT and by overhead announcement, chose not to attend.   Selmer Dominion, LCSW 03/07/2019, 12:04 PM

## 2019-03-07 NOTE — BHH Counselor (Addendum)
Adult Comprehensive Assessment  Patient ID: Kathleen Key, female   DOB: 1966/03/14, 53 y.o.   MRN: [id number]  Information Source: Information source: Patient  Current Stressors:  Patient states their primary concerns and needs for treatment are:: Depression Patient states their goals for this hospitilization and ongoing recovery are:: Have more confidence in herself. Educational / Learning stressors: Denies stressors Employment / Job issues: New job, Garment/textile technologist.  Has been there only 1 week. Family Relationships: Denies stressors Financial / Lack of resources (include bankruptcy): Did not work for 3 months, has been hard financially. Housing / Lack of housing: Denies stressors Physical health (include injuries & life threatening diseases): Denies stressors Social relationships: Denies stressors Substance abuse: Denies stressors Bereavement / Loss: Lost father 4 years ago.  Living/Environment/Situation:  Living Arrangements: Spouse/significant other Living conditions (as described by patient or guardian): Good Who else lives in the home?: Husband How long has patient lived in current situation?: 10-11 years What is atmosphere in current home: Comfortable, Quarry manager, Supportive  Family History:  Marital status: Married Number of Years Married: 72 What types of issues is patient dealing with in the relationship?: No issues Does patient have children?: No  Childhood History:  By whom was/is the patient raised?: Both parents Description of patient's relationship with caregiver when they were a child: Mother and Father - good with ups and downs Patient's description of current relationship with people who raised him/her: Father - died 4 years ago; Mother - "not only my mom but one of my best friends" How were you disciplined when you got in trouble as a child/adolescent?: Switch, belt Does patient have siblings?: Yes Number of Siblings: 2 Description of patient's current  relationship with siblings: Good relationship, talk about things, help each other through things Did patient suffer any verbal/emotional/physical/sexual abuse as a child?: No Did patient suffer from severe childhood neglect?: No Has patient ever been sexually abused/assaulted/raped as an adolescent or adult?: No Witnessed domestic violence?: No Has patient been effected by domestic violence as an adult?: No  Education:  Highest grade of school patient has completed: High school Currently a student?: No Learning disability?: Yes What learning problems does patient have?: ADD  Employment/Work Situation:   Employment situation: Employed Where is patient currently employed?: Scientist, research (life sciences) How long has patient been employed?: 1 week Patient's job has been impacted by current illness: Yes Describe how patient's job has been impacted: It's overwhelming, all the things she has to learn. 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 Where was the patient employed at that time?: 14 years Did You Receive Any Psychiatric Treatment/Services While in the [employer]?: (No [employer]) Are There Guns or Other Weapons in [place]?: Yes Types of Guns/Weapons: "A bunch, my husband is a Forensic psychologist." Are These Weapons Safely Secured?: Yes  Financial Resources:   Financial resources: Income from spouse  Alcohol/Substance Abuse:   What has been your use of drugs/alcohol within the last 12 months?: Denies Alcohol/Substance Abuse Treatment Hx: Denies past history Has alcohol/substance abuse ever caused legal problems?: No  Social Support System:   Patient's Community Support System: Good Describe Community Support System: Husband, mother, siblings Type of faith/religion: Darrick Meigs How does patient's faith help to cope with current illness?: Has not been faithful lately, trying to get back into it.  Leisure/Recreation:   Leisure and Hobbies:  Used to play with Environmental health practitioner, but had to have him put down  in January 2017.  Strengths/Needs:   What is the patient's perception of their strengths?: Good at previous job, gave patients good care Patient states they can use these personal strengths during their treatment to contribute to their recovery: Remember the things she can do, instead of the things she cannot do. Patient states these barriers may affect/interfere with their treatment: None Patient states these barriers may affect their return to the community: None Other important information patient would like considered in planning for their treatment: None  Discharge Plan:   Currently receiving community mental health services: Yes (From Whom)([facility]. [facility] for med[facility]) Patient states concerns and preferences for aftercare planning are: Return to see Dr. Rudene Christians.  Would like to go back to see Jan Fireman at [facility]. Patient states they will know when they are safe and ready for discharge when: "I feel safe now." Does patient have access to transportation?: Yes Does patient have financial barriers related to discharge medications?: No Patient description of barriers related to discharge medications: States will have insurance in a month. Will patient be returning to same living situation after discharge?: Yes  Summary/Recommendations:   Summary and Recommendations (to be completed by the evaluator): Patient is a 53yo female admitted with suicidal ideation with disorientation and not sleeping.  Primary stressors include starting a new job that is stressful as she tries to learn the duties, feeling like she is living in an alternate world, and being more forgetful than usual.  She was without a job for 3 months during the pandemic which created financial stress.  Her husband reported that patient has been taking 'Neurvia' OTC , and Lorazepam regularly, and believes that patient had been taking Lorazepam "more than usual", as he found the  bottle in her purse.  She has been seeing Dr. Montel Culver for her ADHD and depression.  She denies substance use.  Patient will benefit from crisis stabilization, medication evaluation, group therapy and psychoeducation, in addition to case management for discharge planning. At discharge it is recommended that Patient adhere to the established discharge plan and continue in treatment.  Maretta Los. 03/07/2019

## 2019-03-07 NOTE — Progress Notes (Signed)
D: Patient observed standing in her doorway with just a towel wrapped around her body upon initial approach. Pt appeared confused, requesting her clothes so that she could "go to work". After showing pt her bag of clothes, and after some encouragement, pt slowly got dressed.  Pt is more responsive today, is speaking more, and seems 'clearer'- smiled when she was told she seemed to be feeling better. Pt came to med window for her medications, filled out her self inventory (with some assistance), and is now sitting in the dayroom watching tv. Per pt's self inventory, pt rated her depression, hopelessness and anxiety a 3/7/2, respectively. Pt currently denies SI/HI and A/V hallucinations   A; Labs and VS monitored- fluids encouraged. Pt supported emotionally and encouraged to express concerns and ask questions.   R. Pt remains safe with 15 minute checks. Will continue POC.

## 2019-03-07 NOTE — Progress Notes (Signed)
[facility] MD Progress Note  03/07/2019 9:55 AM Kathleen Key  MRN:  [id number] Subjective: Patient is a 53 year old female with a past psychiatric history significant for depression and attention deficit disorder who presented as a walk-in to the behavioral health hospital on 03/05/2019 secondary to decreased eating, decreased personal hygiene, and decreased sleep.  She also appeared to be having internal stimuli.  Objective: Patient is seen and examined.  Patient is a 53 year old female with the above-stated past psychiatric history who is seen in follow-up.  She is actually a little bit better today.  She is able to speak.  Nursing stated that she has eaten and drank during the course the hospitalization.  We discussed her medications, and apparently she had increased her extended release Adderall to 40 mg p.o. twice daily.  She stated that this was about a week prior to her admission.  Hopefully her psychosis and symptoms right now are secondary to too much stimulants.  She admitted to still having auditory hallucinations.  She still psychomotor agitated.  She still very paranoid appearing.  I spoke to her husband yesterday, and asked him to check her medications to see whether or not she had been compliant with her Zoloft, or abusing other medications.  Her husband reported that she had been taking "Neurvia OTC" and lorazepam regularly.  He also believed that she may have been taking more lorazepam than usual.  According to the website Neurvia contains coffee fruit extract and phosphatidylserine.  She denied suicidal or homicidal ideation but clearly is still psychotic.  Her blood pressure is 124/79, temperature is 97.2, pulse is 95.  She only slept 4.25 hours last night.  The only lab we have back so far is her CBC which was completely normal.  Her sedimentation rate was only 11.  Her TSH was 2.368.  I also discussed with her husband her history of sarcoid, and he stated that she had not had any exacerbations  anytime recently, and had never had neurological or psychological issues with regard to a flare.  Principal Problem: <principal problem not specified> Diagnosis: Active Problems:   Severe recurrent major depression with psychotic features ([place])  Total Time spent with patient: 30 minutes  Past Psychiatric History: See admission H&P  Past Medical History:  Past Medical History:  Diagnosis Date  . Asthma   . Fibroids    uterine  . IBS (irritable bowel syndrome)   . Pulmonary sarcoidosis ([place])   . Shortness of breath dyspnea     Past Surgical History:  Procedure Laterality Date  . ABDOMINAL HYSTERECTOMY N/A 10/13/2012   Procedure: HYSTERECTOMY ABDOMINAL;  Surgeon: Jonnie Kind, MD;  Location: [facility];  Service: Gynecology;  Laterality: N/A;  . BILATERAL SALPINGECTOMY Bilateral 10/13/2012   Procedure: BILATERAL SALPINGECTOMY;  Surgeon: Jonnie Kind, MD;  Location: [facility];  Service: Gynecology;  Laterality: Bilateral;  . BREAST BIOPSY Left 05/2015  . BREAST EXCISIONAL BIOPSY Left 05/2015  . BREAST LUMPECTOMY WITH RADIOACTIVE SEED LOCALIZATION Left 07/28/2015   Procedure: LEFT BREAST LUMPECTOMY WITH RADIOACTIVE SEED LOCALIZATION;  Surgeon: Autumn Messing III, MD;  Location: [facility];  Service: General;  Laterality: Left;  . WISDOM TOOTH EXTRACTION     Dr. Georgina Snell office-[facility], [place], [place]   Family History:  Family History  Problem Relation Age of Onset  . Heart disease Mother        leaky valve  . Asthma Mother   . Hypertension Father  pacemaker  . Stroke Father   . Heart disease Father        pacemaker  . Fibromyalgia Sister   . Neuropathy Sister    Family Psychiatric  History: See admission H&P Social History:  Social History   Substance and Sexual Activity  Alcohol Use Yes   Comment: occassional     Social History   Substance and Sexual Activity  Drug Use No    Social History   Socioeconomic History  . Marital status:  Married    Spouse name: Not on file  . Number of children: Not on file  . Years of education: Not on file  . Highest education level: Not on file  Occupational History  . Not on file  Social Needs  . Financial resource strain: Not on file  . Food insecurity    Worry: Not on file    Inability: Not on file  . Transportation needs    Medical: Not on file    Non-medical: Not on file  Tobacco Use  . Smoking status: Never Smoker  . Smokeless tobacco: Never Used  Substance and Sexual Activity  . Alcohol use: Yes    Comment: occassional  . Drug use: No  . Sexual activity: Yes    Birth control/protection: Surgical  Lifestyle  . Physical activity    Days per week: Not on file    Minutes per session: Not on file  . Stress: Not on file  Relationships  . Social Herbalist on phone: Not on file    Gets together: Not on file    Attends religious service: Not on file    Active member of club or organization: Not on file    Attends meetings of clubs or organizations: Not on file    Relationship status: Not on file  Other Topics Concern  . Not on file  Social History Narrative  . Not on file   Additional Social History:    Pain Medications: see MAR Prescriptions: see MAR Over the Counter: see MAR History of alcohol / drug use?: No history of alcohol / drug abuse                    Sleep: Fair  Appetite:  Poor  Current Medications: Current Facility-Administered Medications  Medication Dose Route Frequency Provider Last Rate Last Dose  . albuterol (VENTOLIN HFA) 108 (90 Base) MCG/ACT inhaler 1-2 puff  1-2 puff Inhalation Q6H PRN Sharma Covert, MD      . alum & mag hydroxide-simeth (MAALOX/MYLANTA) 200-200-20 MG/5ML suspension 30 mL  30 mL Oral Q4H PRN Lindon Romp A, NP      . feeding supplement (ENSURE ENLIVE) (ENSURE ENLIVE) liquid 237 mL  237 mL Oral BID BM Sharma Covert, MD   237 mL at 03/06/19 1753  . haloperidol lactate (HALDOL) injection 5 mg   5 mg Intramuscular BID Sharma Covert, MD   5 mg at 03/06/19 1814  . hydrOXYzine (ATARAX/VISTARIL) tablet 25 mg  25 mg Oral TID PRN Rozetta Nunnery, NP   25 mg at 03/06/19 2127  . LORazepam (ATIVAN) tablet 1 mg  1 mg Oral Q6H PRN Sharma Covert, MD      . magnesium hydroxide (MILK OF MAGNESIA) suspension 30 mL  30 mL Oral Daily PRN Lindon Romp A, NP      . risperiDONE (RISPERDAL M-TABS) disintegrating tablet 1 mg  1 mg Oral BID Mallie Darting Cordie Grice, MD  1 mg at 03/07/19 0827  . sertraline (ZOLOFT) tablet 200 mg  200 mg Oral Daily Sharma Covert, MD   200 mg at 03/07/19 0827  . traZODone (DESYREL) tablet 50 mg  50 mg Oral QHS PRN Rozetta Nunnery, NP   50 mg at 03/06/19 2127    Lab Results:  Results for orders placed or performed during the hospital encounter of 03/05/19 (from the past 48 hour(s))  SARS Coronavirus 2 by RT PCR (hospital order, performed in [facility] hospital lab) Nasopharyngeal Nasopharyngeal Swab     Status: None   Collection Time: 03/05/19  3:50 PM   Specimen: Nasopharyngeal Swab  Result Value Ref Range   SARS Coronavirus 2 NEGATIVE NEGATIVE    Comment: (NOTE) SARS-CoV-2 target nucleic acids are NOT DETECTED. The SARS-CoV-2 RNA is generally detectable in upper and lower respiratory specimens during the acute phase of infection. The lowest concentration of SARS-CoV-2 viral copies this assay can detect is 250 copies / mL. A negative result does not preclude SARS-CoV-2 infection and should not be used as the sole basis for treatment or other patient management decisions.  A negative result may occur with improper specimen collection / handling, submission of specimen other than nasopharyngeal swab, presence of viral mutation(s) within the areas targeted by this assay, and inadequate number of viral copies (<250 copies / mL). A negative result must be combined with clinical observations, patient history, and epidemiological information. Fact Sheet for Patients:    StrictlyIdeas.no Fact Sheet for Healthcare Providers: BankingDealers.co.za This test is not yet approved or cleared  by the [place] FDA and has been authorized for detection and/or diagnosis of SARS-CoV-2 by FDA under an Emergency Use Authorization (EUA).  This EUA will remain in effect (meaning this test can be used) for the duration of the COVID-19 declaration under Section 564(b)(1) of the Act, 21 U.S.C. section 360bbb-3(b)(1), unless the authorization is terminated or revoked sooner. Performed at [facility], [place] [address], [place], [place] [address]   CBC     Status: None   Collection Time: 03/07/19  6:33 AM  Result Value Ref Range   WBC 5.8 4.0 - 10.5 K/uL   RBC 4.31 3.87 - 5.11 MIL/uL   Hemoglobin 14.0 12.0 - 15.0 g/dL   HCT 42.8 36.0 - 46.0 %   MCV 99.3 80.0 - 100.0 fL   MCH 32.5 26.0 - 34.0 pg   MCHC 32.7 30.0 - 36.0 g/dL   RDW 13.1 11.5 - 15.5 %   Platelets 288 150 - 400 K/uL   nRBC 0.0 0.0 - 0.2 %    Comment: Performed at [facility], [place] [address] [place], [place] [address]  TSH     Status: None   Collection Time: 03/07/19  6:33 AM  Result Value Ref Range   TSH 2.368 0.350 - 4.500 uIU/mL    Comment: Performed by a 3rd Generation assay with a functional sensitivity of <=0.01 uIU/mL. Performed at [facility], [place] [address] [place], [place] [address]     Blood Alcohol level:  No results found for: [facility]  Metabolic Disorder Labs: No results found for: HGBA1C, MPG No results found for: PROLACTIN No results found for: CHOL, TRIG, HDL, CHOLHDL, VLDL, LDLCALC  Physical Findings: AIMS: Facial and Oral Movements Muscles of Facial Expression: None, normal Lips and Perioral Area: None, normal Jaw: None, normal Tongue: None, normal,Extremity Movements Upper (arms, wrists, hands, fingers): None, normal Lower (legs, knees, ankles, toes):  None, normal,  Trunk Movements Neck, shoulders, hips: None, normal, Overall Severity Severity of abnormal movements (highest score from questions above): None, normal Incapacitation due to abnormal movements: None, normal Patient's awareness of abnormal movements (rate only patient's report): No Awareness, Dental Status Current problems with teeth and/or dentures?: No Does patient usually wear dentures?: No  CIWA:  CIWA-Ar Total: 0 COWS:  COWS Total Score: 1  Musculoskeletal: Strength & Muscle Tone: within normal limits Gait & Station: normal Patient leans: N/A  Psychiatric Specialty Exam: Physical Exam  Nursing note and vitals reviewed. Constitutional: She appears well-developed.  HENT:  Head: Normocephalic and atraumatic.  Respiratory: Effort normal.  Neurological: She is alert.    ROS  Blood pressure 124/79, pulse 95, temperature (!) 97.2 F (36.2 C), temperature source Oral, resp. rate 18, height 5' 3.5" (1.613 m), weight 58.5 kg, last menstrual period 10/08/2012, SpO2 100 %.Body mass index is 22.49 kg/m.  General Appearance: Disheveled  Eye Contact:  Minimal  Speech:  Slow  Volume:  Decreased  Mood:  Anxious, Depressed and Dysphoric  Affect:  Congruent  Thought Process:  Disorganized and Descriptions of Associations: Circumstantial  Orientation:  Negative  Thought Content:  Delusions and Hallucinations: Auditory  Suicidal Thoughts:  No  Homicidal Thoughts:  No  Memory:  Immediate;   Poor Recent;   Fair Remote;   Fair  Judgement:  Impaired  Insight:  Fair  Psychomotor Activity:  Increased  Concentration:  Concentration: Fair and Attention Span: Fair  Recall:  [employer] of Knowledge:  Fair  Language:  Fair  Akathisia:  Negative  Handed:  Right  AIMS (if indicated):     Assets:  Desire for Improvement Resilience Social Support  ADL's:  Intact  Cognition:  Impaired,  Moderate  Sleep:  Number of Hours: 4.25     Treatment Plan Summary: Daily contact with patient to  assess and evaluate symptoms and progress in treatment, Medication management and Plan : Patient is seen and examined.  Patient is a 53 year old female with the above-stated past psychiatric history who is seen in follow-up.   Diagnosis: #1 psychosis, #2 suspected substance-induced psychotic disorder from overuse of stimulants, #3 history of major depression, severe in the past, #4 attention deficit disorder, #5 sarcoid, #6 generalized anxiety disorder  Patient is seen in follow-up.  She is a little bit better today.  She is less mute, and does admit to auditory hallucinations.  She also admitted that she had doubled her Adderall XR to 40 mg twice a day approximately a week prior to admission.  I am hopeful that that is the source of her psychosis and the current hospitalization.  She is eating and drinking a bit better.  Were waiting for her electrolyte panel and liver function enzymes come back.  She does have a history of sarcoid, but her's sedimentation rate is only 11, but I did order an ACE level just in case.  Her husband stated she could be overusing her lorazepam as well, and I will change her lorazepam to 1 mg p.o. twice daily with 1 mg p.o. every 6 hours secondary to anxiety or withdrawal symptoms.  I will also increase her Risperdal dose to 2 mg p.o. twice daily.  We will wait to see with the rest of her laboratories come back like.  Her urine pregnancy test is still not present, and drug screen has still not been collected.  I also contacted Dr. Lissa Hoard packs at [facility] with regard to  possible ECT, and if the cause of this episode is not due to the stimulants and she would require ECT for her depression he is open to her being transferred.  1.  Continue albuterol HFA 2 puffs every 6 hours as needed wheezing. 2.  Continue hydroxyzine 25 mg p.o. 3 times daily as needed anxiety. 3.  Change lorazepam to 1 mg p.o. twice daily standing and 1 mg p.o. every 8 hours as needed  anxiety. 4.  Increase Risperdal to 2 mg p.o. twice daily for psychosis. 5.  Continue sertraline 200 mg p.o. daily for depression and anxiety. 6.  Continue trazodone 50 mg p.o. nightly as needed insomnia. 7.  If depression continues to be poor, and her mood and affect as well as psychosis does not improve consideration of ECT. 8.  Disposition planning-in progress.  Sharma Covert, MD 03/07/2019, 9:55 AM

## 2019-03-07 NOTE — Progress Notes (Signed)
EKG results placed on the outside of shadow chart  NSR Normal ECG  Qt/QTc 404/413 ms

## 2019-03-07 NOTE — Progress Notes (Signed)

## 2019-03-07 NOTE — [facility] Group Notes (Signed)
[place] Group Notes:  (Nursing/MHT/Case Management/Adjunct)  Date:  03/07/2019  Time:  0900  Type of Therapy:  Nurse Education Healthy support systems and coping skills collage   Participation Level:  Active  Participation Quality:  Appropriate  Affect:  Appropriate  Cognitive:  Disorganized  Insight:  Improving  Engagement in Group:  Engaged  Modes of Intervention:  Activity and Discussion  Summary of Progress/Problems:  Marissa Calamity 03/07/2019, 10:32 AM

## 2019-03-08 LAB — PREGNANCY, URINE: Preg Test, Ur: NEGATIVE

## 2019-03-08 LAB — ANGIOTENSIN CONVERTING ENZYME: Angiotensin-Converting Enzyme: 63 U/L (ref 14–82)

## 2019-03-08 LAB — RAPID URINE DRUG SCREEN, HOSP PERFORMED
Amphetamines: POSITIVE — AB
Barbiturates: NOT DETECTED
Benzodiazepines: NOT DETECTED
Cocaine: NOT DETECTED
Opiates: NOT DETECTED
Tetrahydrocannabinol: NOT DETECTED

## 2019-03-08 MED ORDER — ADULT MULTIVITAMIN W/MINERALS CH
1.0000 | ORAL_TABLET | Freq: Every day | ORAL | Status: DC
  Administered 2019-03-08 – 2019-03-12 (×5): 1 via ORAL
  Filled 2019-03-08 (×7): qty 1

## 2019-03-08 MED ORDER — LORAZEPAM 1 MG PO TABS
1.0000 mg | ORAL_TABLET | Freq: Three times a day (TID) | ORAL | Status: DC
  Administered 2019-03-08 – 2019-03-09 (×3): 1 mg via ORAL
  Filled 2019-03-08 (×3): qty 1

## 2019-03-08 NOTE — Progress Notes (Signed)
[facility] MD Progress Note  03/08/2019 10:07 AM Kathleen Key  MRN:  [id number] Subjective:   Patient seen she is anxious even little tremulous during some of the interview release fidgety, and continues to be somewhat guarded anxious and dysphoric.  She denies auditory or visual hallucinations she is clearly no longer mute but gives minimal answers.  States she is sleeping well does not know what to "make of" her reasons for hospitalization other than she was depressed Principal Problem: Severity of depression with mutism and psychotic features Diagnosis: Active Problems:   Severe recurrent major depression with psychotic features ([place])  Total Time spent with patient: 20 minutes  Past Psychiatric History: Did not elaborate but allows me to call her husband  Past Medical History:  Past Medical History:  Diagnosis Date  . Asthma   . Fibroids    uterine  . IBS (irritable bowel syndrome)   . Pulmonary sarcoidosis ([place])   . Shortness of breath dyspnea     Past Surgical History:  Procedure Laterality Date  . ABDOMINAL HYSTERECTOMY N/A 10/13/2012   Procedure: HYSTERECTOMY ABDOMINAL;  Surgeon: Jonnie Kind, MD;  Location: [facility];  Service: Gynecology;  Laterality: N/A;  . BILATERAL SALPINGECTOMY Bilateral 10/13/2012   Procedure: BILATERAL SALPINGECTOMY;  Surgeon: Jonnie Kind, MD;  Location: [facility];  Service: Gynecology;  Laterality: Bilateral;  . BREAST BIOPSY Left 05/2015  . BREAST EXCISIONAL BIOPSY Left 05/2015  . BREAST LUMPECTOMY WITH RADIOACTIVE SEED LOCALIZATION Left 07/28/2015   Procedure: LEFT BREAST LUMPECTOMY WITH RADIOACTIVE SEED LOCALIZATION;  Surgeon: Autumn Messing III, MD;  Location: [place];  Service: General;  Laterality: Left;  . WISDOM TOOTH EXTRACTION     Dr. Georgina Snell office-[facility], [place], [place]   Family History:  Family History  Problem Relation Age of Onset  . Heart disease Mother        leaky valve  . Asthma Mother   . Hypertension  Father        pacemaker  . Stroke Father   . Heart disease Father        pacemaker  . Fibromyalgia Sister   . Neuropathy Sister    Family Psychiatric  History: neg Social History:  Social History   Substance and Sexual Activity  Alcohol Use Yes   Comment: occassional     Social History   Substance and Sexual Activity  Drug Use No    Social History   Socioeconomic History  . Marital status: Married    Spouse name: Not on file  . Number of children: Not on file  . Years of education: Not on file  . Highest education level: Not on file  Occupational History  . Not on file  Social Needs  . Financial resource strain: Not on file  . Food insecurity    Worry: Not on file    Inability: Not on file  . Transportation needs    Medical: Not on file    Non-medical: Not on file  Tobacco Use  . Smoking status: Never Smoker  . Smokeless tobacco: Never Used  Substance and Sexual Activity  . Alcohol use: Yes    Comment: occassional  . Drug use: No  . Sexual activity: Yes    Birth control/protection: Surgical  Lifestyle  . Physical activity    Days per week: Not on file    Minutes per session: Not on file  . Stress: Not on file  Relationships  . Social connections    Talks  on phone: Not on file    Gets together: Not on file    Attends religious service: Not on file    Active member of club or organization: Not on file    Attends meetings of clubs or organizations: Not on file    Relationship status: Not on file  Other Topics Concern  . Not on file  Social History Narrative  . Not on file   Additional Social History:    Pain Medications: see MAR Prescriptions: see MAR Over the Counter: see MAR History of alcohol / drug use?: No history of alcohol / drug abuse                    Sleep: Good  Appetite:  Good  Current Medications: Current Facility-Administered Medications  Medication Dose Route Frequency Provider Last Rate Last Dose  . albuterol  (VENTOLIN HFA) 108 (90 Base) MCG/ACT inhaler 1-2 puff  1-2 puff Inhalation Q6H PRN Sharma Covert, MD      . alum & mag hydroxide-simeth (MAALOX/MYLANTA) 200-200-20 MG/5ML suspension 30 mL  30 mL Oral Q4H PRN Lindon Romp A, NP      . feeding supplement (ENSURE ENLIVE) (ENSURE ENLIVE) liquid 237 mL  237 mL Oral BID BM Sharma Covert, MD   237 mL at 03/07/19 1200  . haloperidol lactate (HALDOL) injection 5 mg  5 mg Intramuscular BID Sharma Covert, MD   5 mg at 03/06/19 1814  . hydrOXYzine (ATARAX/VISTARIL) tablet 25 mg  25 mg Oral TID PRN Rozetta Nunnery, NP   25 mg at 03/06/19 2127  . LORazepam (ATIVAN) tablet 1 mg  1 mg Oral Q8H PRN Sharma Covert, MD      . LORazepam (ATIVAN) tablet 1 mg  1 mg Oral TID Johnn Hai, MD      . magnesium hydroxide (MILK OF MAGNESIA) suspension 30 mL  30 mL Oral Daily PRN Lindon Romp A, NP      . multivitamin with minerals tablet 1 tablet  1 tablet Oral Daily Hampton Abbot, MD      . risperiDONE (RISPERDAL M-TABS) disintegrating tablet 2 mg  2 mg Oral BID Sharma Covert, MD   2 mg at 03/08/19 0752  . sertraline (ZOLOFT) tablet 200 mg  200 mg Oral Daily Sharma Covert, MD   200 mg at 03/08/19 0752  . traZODone (DESYREL) tablet 50 mg  50 mg Oral QHS PRN Rozetta Nunnery, NP   50 mg at 03/06/19 2127    Lab Results:  Results for orders placed or performed during the hospital encounter of 03/05/19 (from the past 48 hour(s))  CBC     Status: None   Collection Time: 03/07/19  6:33 AM  Result Value Ref Range   WBC 5.8 4.0 - 10.5 K/uL   RBC 4.31 3.87 - 5.11 MIL/uL   Hemoglobin 14.0 12.0 - 15.0 g/dL   HCT 42.8 36.0 - 46.0 %   MCV 99.3 80.0 - 100.0 fL   MCH 32.5 26.0 - 34.0 pg   MCHC 32.7 30.0 - 36.0 g/dL   RDW 13.1 11.5 - 15.5 %   Platelets 288 150 - 400 K/uL   nRBC 0.0 0.0 - 0.2 %    Comment: Performed at [facility], [place] [address], [place], [place] [address]  Comprehensive metabolic panel     Status: Abnormal    Collection Time: 03/07/19  6:33 AM  Result Value Ref Range   Sodium 140  135 - 145 mmol/L   Potassium 3.3 (L) 3.5 - 5.1 mmol/L   Chloride 102 98 - 111 mmol/L   CO2 27 22 - 32 mmol/L   Glucose, Bld 97 70 - 99 mg/dL   BUN 13 6 - 20 mg/dL   Creatinine, Ser 0.58 0.44 - 1.00 mg/dL   Calcium 9.1 8.9 - 10.3 mg/dL   Total Protein 6.9 6.5 - 8.1 g/dL   Albumin 3.8 3.5 - 5.0 g/dL   AST 29 15 - 41 U/L   ALT 22 0 - 44 U/L   Alkaline Phosphatase 54 38 - 126 U/L   Total Bilirubin 0.5 0.3 - 1.2 mg/dL   GFR calc non Af Amer >60 >60 mL/min   GFR calc Af Amer >60 >60 mL/min   Anion gap 11 5 - 15    Comment: Performed at [facility], [place] [address] [place], [place] [address]  Hemoglobin A1c     Status: None   Collection Time: 03/07/19  6:33 AM  Result Value Ref Range   Hgb A1c MFr Bld 5.6 4.8 - 5.6 %    Comment: (NOTE) Pre diabetes:          5.7%-6.4% Diabetes:              >6.4% Glycemic control for   <7.0% adults with diabetes    Mean Plasma Glucose 114.02 mg/dL    Comment: Performed at [place] [address] [place], [place] [address]  Lipid panel     Status: Abnormal   Collection Time: 03/07/19  6:33 AM  Result Value Ref Range   Cholesterol 194 0 - 200 mg/dL   Triglycerides 78 <150 mg/dL   HDL 74 >40 mg/dL   Total CHOL/HDL Ratio 2.6 RATIO   VLDL 16 0 - 40 mg/dL   LDL Cholesterol 104 (H) 0 - 99 mg/dL    Comment:        Total Cholesterol/HDL:CHD Risk Coronary Heart Disease Risk Table                     Men   Women  1/2 Average Risk   3.4   3.3  Average Risk       5.0   4.4  2 X Average Risk   9.6   7.1  3 X Average Risk  23.4   11.0        Use the calculated Patient Ratio above and the CHD Risk Table to determine the patient's CHD Risk.        ATP III CLASSIFICATION (LDL):  <100     mg/dL   Optimal  100-129  mg/dL   Near or Above                    Optimal  130-159  mg/dL   Borderline  160-189  mg/dL   High  >190     mg/dL   Very  High Performed at [place] [address] [place], [place] [address]   TSH     Status: None   Collection Time: 03/07/19  6:33 AM  Result Value Ref Range   TSH 2.368 0.350 - 4.500 uIU/mL    Comment: Performed by a 3rd Generation assay with a functional sensitivity of <=0.01 uIU/mL. Performed at [facility], [place] [address], [place], [place] [address]   Sedimentation rate     Status: None   Collection Time: 03/07/19  6:33 AM  Result Value Ref Range   Sed Rate 11 0 - 22 mm/hr    Comment: Performed at [facility], [place] [address], [place], [place] [address]  Pregnancy, urine     Status: None   Collection Time: 03/07/19 10:17 AM  Result Value Ref Range   Preg Test, Ur NEGATIVE NEGATIVE    Comment:        THE SENSITIVITY OF THIS METHODOLOGY IS >20 mIU/mL. Performed at [facility], [place] [address] [place], [place] [address]   Rapid urine drug screen (hospital performed)     Status: Abnormal   Collection Time: 03/07/19 10:17 AM  Result Value Ref Range   Opiates NONE DETECTED NONE DETECTED   Cocaine NONE DETECTED NONE DETECTED   Benzodiazepines NONE DETECTED NONE DETECTED   Amphetamines POSITIVE (A) NONE DETECTED   Tetrahydrocannabinol NONE DETECTED NONE DETECTED   Barbiturates NONE DETECTED NONE DETECTED    Comment: (NOTE) DRUG SCREEN FOR MEDICAL PURPOSES ONLY.  IF CONFIRMATION IS NEEDED FOR ANY PURPOSE, NOTIFY LAB WITHIN 5 DAYS. LOWEST DETECTABLE LIMITS FOR URINE DRUG SCREEN Drug Class                     Cutoff (ng/mL) Amphetamine and metabolites    1000 Barbiturate and metabolites    200 Benzodiazepine                 [id number] Tricyclics and metabolites     300 Opiates and metabolites        300 Cocaine and metabolites        300 THC                            50 Performed at [facility], [place] [address] [place], [place] [address]     Blood Alcohol level:  No results  found for: [facility]  Metabolic Disorder Labs: Lab Results  Component Value Date   HGBA1C 5.6 03/07/2019   MPG 114.02 03/07/2019   No results found for: PROLACTIN Lab Results  Component Value Date   CHOL 194 03/07/2019   TRIG 78 03/07/2019   HDL 74 03/07/2019   CHOLHDL 2.6 03/07/2019   VLDL 16 03/07/2019   LDLCALC 104 (H) 03/07/2019    Physical Findings: AIMS: Facial and Oral Movements Muscles of Facial Expression: None, normal Lips and Perioral Area: None, normal Jaw: None, normal Tongue: None, normal,Extremity Movements Upper (arms, wrists, hands, fingers): None, normal Lower (legs, knees, ankles, toes): None, normal, Trunk Movements Neck, shoulders, hips: None, normal, Overall Severity Severity of abnormal movements (highest score from questions above): None, normal Incapacitation due to abnormal movements: None, normal Patient's awareness of abnormal movements (rate only patient's report): No Awareness, Dental Status Current problems with teeth and/or dentures?: No Does patient usually wear dentures?: No  CIWA:  CIWA-Ar Total: 0 COWS:  COWS Total Score: 1  Musculoskeletal: Strength & Muscle Tone: within normal limits Gait & Station: normal Patient leans: N/A  Psychiatric Specialty Exam: Physical Exam  ROS  Blood pressure 111/76, pulse 69, temperature 97.9 F (36.6 C), temperature source Oral, resp. rate 18, height 5' 3.5" (1.613 m), weight 58.5 kg, last menstrual period 10/08/2012, SpO2 100 %.Body mass index is 22.49 kg/m.  General Appearance: Casual  Eye Contact:  Fair  Speech:  Clear and Coherent  Volume:  Decreased  Mood:  Anxious  Affect:  Blunt  Thought Process:  Linear and  Descriptions of Associations: Circumstantial  Orientation:  Full (Time, Place, and Person)  Thought Content:  Rumination  Suicidal Thoughts:  No  Homicidal Thoughts:  neg  Memory:  Immediate;   Poor Recent;   Fair Remote;   Good  Judgement:  Fair  Insight:  Fair  Psychomotor  Activity:  Normal  Concentration:  Concentration: Fair and Attention Span: Fair  Recall:  [employer] of Knowledge:  Fair  Language:  Fair  Akathisia:  Negative  Handed:  Right  AIMS (if indicated):     Assets:  Communication Skills Desire for Improvement  ADL's:  Intact  Cognition:  WNL  Sleep:  Number of Hours: 6.75     Treatment Plan Summary: Daily contact with patient to assess and evaluate symptoms and progress in treatment and Medication management  Continue cognitive therapy continue reality based therapy escalate lorazepam due to anxiety continue antidepressant antipsychotic therapy continue to monitor for safety on 15-minute precautions discussed with family  Satya Buttram, MD 03/08/2019, 10:07 AM

## 2019-03-08 NOTE — Progress Notes (Signed)
   03/08/19 2100  Psych Admission Type (Psych Patients Only)  Admission Status Voluntary  Psychosocial Assessment  Patient Complaints Anxiety  Eye Contact Fair  Facial Expression Flat  Affect Sad;Flat  Speech Logical/coherent;Slow  Interaction No initiation  Motor Activity Slow  Appearance/Hygiene Unremarkable  Behavior Characteristics Cooperative  Mood Pleasant  Thought Process  Coherency WDL  Content WDL  Delusions None reported or observed  Perception WDL  Hallucination None reported or observed  Judgment Poor  Confusion Mild  Danger to Self  Current suicidal ideation? Denies  Self-Injurious Behavior No self-injurious ideation or behavior indicators observed or expressed   Danger to Others  Danger to Others None reported or observed

## 2019-03-08 NOTE — [facility] Suicide Risk Assessment (Signed)
[place] INPATIENT:  Family/Significant Other Suicide Prevention Education  Suicide Prevention Education:  Education Completed; Kathleen Key, husband [phone] has been identified by the patient as the family member/significant other with whom the patient will be residing, and identified as the person(s) who will aid the patient in the event of a mental health crisis (suicidal ideations/suicide attempt).  With written consent from the patient, the family member/significant other has been provided the following suicide prevention education, prior to the and/or following the discharge of the patient.  The suicide prevention education provided includes the following:  Suicide risk factors  Suicide prevention and interventions  National Suicide Hotline telephone number  [facility] assessment telephone number  [facility] Emergency Assistance [place] and/or Residential Mobile Crisis Unit telephone number  Request made of family/significant other to:  Remove weapons (e.g., guns, rifles, knives), all items previously/currently identified as safety concern.    Remove drugs/medications (over-the-counter, prescriptions, illicit drugs), all items previously/currently identified as a safety concern.  The family member/significant other verbalizes understanding of the suicide prevention education information provided.  The family member/significant other agrees to remove the items of safety concern listed above. Mr. Maron reports the pt was admitted to the hospital due to overwhelming life stressors. He says she recently started a new job and succumbed to the pressures of the job. He feels she put undue pressure on herself to not let him down. He discussed her having low self esteem and concerns about what other people think of her. He reports guns in the home but states  they are locked in a gun safe and she does not know the combination.   Kathleen Key 03/08/2019,  4:04 PM

## 2019-03-08 NOTE — Progress Notes (Signed)
NUTRITION ASSESSMENT  48 hour Calorie Count ordered 12/5.   INTERVENTION: 1. Multivitamin with minerals daily 2. Ensure Enlive po PRN, each supplement provides 350 kcal and 20 grams of protein 3. Will d/c Calorie Count once 48 hours has ended.  NUTRITION DIAGNOSIS: Unintentional weight loss related to sub-optimal intake as evidenced by pt report.   Goal: Pt to meet >/= 90% of their estimated nutrition needs.  Monitor:  PO intake  Assessment:  Pt admitted with depression and psychosis. Pt reports at admission that she was eating less recently.  Calorie Count order was placed 12/5. It is difficult for RDs to calculate calorie counts at [facility] given the cafeteria system. Per chart review, pt has been eating and drinking per nursing staff.  Will order daily MVI. If pt's PO intake decreases, recommend Ensure supplements. Will order PRN. Per weight records, pt's weight has increased since August 2020.   Height: Ht Readings from Last 1 Encounters:  03/06/19 5' 3.5" (1.613 m)    Weight: Wt Readings from Last 1 Encounters:  03/06/19 58.5 kg    Weight Hx: Wt Readings from Last 10 Encounters:  03/06/19 58.5 kg  11/09/18 54.6 kg  06/24/18 55.3 kg  05/26/18 54.4 kg  04/29/18 55.1 kg  04/25/17 54.9 kg  10/23/16 55.3 kg  08/07/16 54.4 kg  07/28/15 53.5 kg  02/23/14 55.6 kg    BMI:  Body mass index is 22.49 kg/m. Pt meets criteria for normal based on current BMI.  Estimated Nutritional Needs: Kcal: 25-30 kcal/kg Protein: > 1 gram protein/kg Fluid: 1 ml/kcal  Diet Order:  Diet Order            Diet regular Room service appropriate? Yes; Fluid consistency: Thin  Diet effective now             Pt is also offered choice of unit snacks mid-morning and mid-afternoon.  Pt is eating as desired.   Lab results and medications reviewed.   Clayton Bibles, MS, RD, LDN Inpatient Clinical Dietitian Pager: [phone] After Hours Pager: [phone]

## 2019-03-08 NOTE — Tx Team (Signed)
Interdisciplinary Treatment and Diagnostic Plan Update  03/08/2019 Time of Session: 10:00am Kathleen Key MRN: [id number]  Principal Diagnosis: <principal problem not specified>  Secondary Diagnoses: Active Problems:   Severe recurrent major depression with psychotic features (HCC)   Current Medications:  Current Facility-Administered Medications  Medication Dose Route Frequency Provider Last Rate Last Dose  . albuterol (VENTOLIN HFA) 108 (90 Base) MCG/ACT inhaler 1-2 puff  1-2 puff Inhalation Q6H PRN Sharma Covert, MD      . alum & mag hydroxide-simeth (MAALOX/MYLANTA) 200-200-20 MG/5ML suspension 30 mL  30 mL Oral Q4H PRN Lindon Romp A, NP      . feeding supplement (ENSURE ENLIVE) (ENSURE ENLIVE) liquid 237 mL  237 mL Oral BID BM Sharma Covert, MD   237 mL at 03/07/19 1200  . haloperidol lactate (HALDOL) injection 5 mg  5 mg Intramuscular BID Sharma Covert, MD   5 mg at 03/06/19 1814  . hydrOXYzine (ATARAX/VISTARIL) tablet 25 mg  25 mg Oral TID PRN Rozetta Nunnery, NP   25 mg at 03/06/19 2127  . LORazepam (ATIVAN) tablet 1 mg  1 mg Oral Q8H PRN Sharma Covert, MD      . LORazepam (ATIVAN) tablet 1 mg  1 mg Oral TID Johnn Hai, MD      . magnesium hydroxide (MILK OF MAGNESIA) suspension 30 mL  30 mL Oral Daily PRN Lindon Romp A, NP      . multivitamin with minerals tablet 1 tablet  1 tablet Oral Daily Hampton Abbot, MD      . risperiDONE (RISPERDAL M-TABS) disintegrating tablet 2 mg  2 mg Oral BID Sharma Covert, MD   2 mg at 03/08/19 0752  . sertraline (ZOLOFT) tablet 200 mg  200 mg Oral Daily Sharma Covert, MD   200 mg at 03/08/19 0752  . traZODone (DESYREL) tablet 50 mg  50 mg Oral QHS PRN Rozetta Nunnery, NP   50 mg at 03/06/19 2127   PTA Medications: Medications Prior to Admission  Medication Sig Dispense Refill Last Dose  . albuterol (VENTOLIN HFA) 108 (90 Base) MCG/ACT inhaler Inhale 2 puffs every 6 hours if needed for breathing 18 g 12 Past Month  at Unknown time  . amphetamine-dextroamphetamine (ADDERALL XR) 20 MG 24 hr capsule Take 2 capsules (40 mg total) by mouth daily. 60 capsule 0 Past Month at Unknown time  . estradiol (ESTRACE) 1 MG tablet Take 1 mg by mouth daily.   Past Month at Unknown time  . LORazepam (ATIVAN) 0.5 MG tablet Take 1 tablet (0.5 mg total) by mouth 2 (two) times daily as needed for anxiety. 60 tablet 2 Past Month at Unknown time  . polyethylene glycol (MIRALAX / GLYCOLAX) packet Take 17 g by mouth 3 (three) times daily as needed. 14 each 0 Past Month at Unknown time  . sertraline (ZOLOFT) 100 MG tablet Take 2 tablets (200 mg total) by mouth daily. 60 tablet 0 Past Month at Unknown time    Patient Stressors: Health problems Medication change or noncompliance  Patient Strengths: Technical sales engineer for treatment/growth  Treatment Modalities: Medication Management, Group therapy, Case management,  1 to 1 session with clinician, Psychoeducation, Recreational therapy.   Physician Treatment Plan for Primary Diagnosis: <principal problem not specified> Long Term Goal(s): Improvement in symptoms so as ready for discharge Improvement in symptoms so as ready for discharge   Short Term Goals: Ability to identify changes in lifestyle to reduce recurrence of  condition will improve Ability to verbalize feelings will improve Ability to disclose and discuss suicidal ideas Ability to demonstrate self-control will improve Ability to identify and develop effective coping behaviors will improve Ability to maintain clinical measurements within normal limits will improve Ability to identify changes in lifestyle to reduce recurrence of condition will improve Ability to verbalize feelings will improve Ability to disclose and discuss suicidal ideas Ability to demonstrate self-control will improve Ability to identify and develop effective coping behaviors will improve Ability to maintain clinical measurements  within normal limits will improve  Medication Management: Evaluate patient's response, side effects, and tolerance of medication regimen.  Therapeutic Interventions: 1 to 1 sessions, Unit Group sessions and Medication administration.  Evaluation of Outcomes: Progressing  Physician Treatment Plan for Secondary Diagnosis: Active Problems:   Severe recurrent major depression with psychotic features ([place])  Long Term Goal(s): Improvement in symptoms so as ready for discharge Improvement in symptoms so as ready for discharge   Short Term Goals: Ability to identify changes in lifestyle to reduce recurrence of condition will improve Ability to verbalize feelings will improve Ability to disclose and discuss suicidal ideas Ability to demonstrate self-control will improve Ability to identify and develop effective coping behaviors will improve Ability to maintain clinical measurements within normal limits will improve Ability to identify changes in lifestyle to reduce recurrence of condition will improve Ability to verbalize feelings will improve Ability to disclose and discuss suicidal ideas Ability to demonstrate self-control will improve Ability to identify and develop effective coping behaviors will improve Ability to maintain clinical measurements within normal limits will improve     Medication Management: Evaluate patient's response, side effects, and tolerance of medication regimen.  Therapeutic Interventions: 1 to 1 sessions, Unit Group sessions and Medication administration.  Evaluation of Outcomes: Progressing   RN Treatment Plan for Primary Diagnosis: <principal problem not specified> Long Term Goal(s): Knowledge of disease and therapeutic regimen to maintain health will improve  Short Term Goals: Ability to participate in decision making will improve, Ability to verbalize feelings will improve, Ability to identify and develop effective coping behaviors will improve and  Compliance with prescribed medications will improve  Medication Management: RN will administer medications as ordered by provider, will assess and evaluate patient's response and provide education to patient for prescribed medication. RN will report any adverse and/or side effects to prescribing provider.  Therapeutic Interventions: 1 on 1 counseling sessions, Psychoeducation, Medication administration, Evaluate responses to treatment, Monitor vital signs and CBGs as ordered, Perform/monitor CIWA, COWS, AIMS and Fall Risk screenings as ordered, Perform wound care treatments as ordered.  Evaluation of Outcomes: Progressing   LCSW Treatment Plan for Primary Diagnosis: <principal problem not specified> Long Term Goal(s): Safe transition to appropriate next level of care at discharge, Engage patient in therapeutic group addressing interpersonal concerns.  Short Term Goals: Engage patient in aftercare planning with referrals and resources, Increase social support, Increase ability to appropriately verbalize feelings, Identify triggers associated with mental health/substance abuse issues and Increase skills for wellness and recovery  Therapeutic Interventions: Assess for all discharge needs, 1 to 1 time with Social worker, Explore available resources and support systems, Assess for adequacy in community support network, Educate family and significant other(s) on suicide prevention, Complete Psychosocial Assessment, Interpersonal group therapy.  Evaluation of Outcomes: Progressing   Progress in Treatment: Attending groups: No. Participating in groups: No. Taking medication as prescribed: Yes. Toleration medication: Yes. Family/Significant other contact made: No, will contact:  husband, Aaron Edelman Patient understands diagnosis: Yes.  Discussing patient identified problems/goals with staff: No. Medical problems stabilized or resolved: No. Denies suicidal/homicidal ideation: Yes. Issues/concerns per  patient self-inventory: Yes.  New problem(s) identified: Yes, Describe:  financial stressors  New Short Term/Long Term Goal(s): medication management for mood stabilization; elimination of SI thoughts; development of comprehensive mental wellness/sobriety plan.  Patient Goals:  Wants to get better, more confidence.  Discharge Plan or Barriers: Expected to return home with spouse, follow up with Dr.Pucilowski  Reason for Continuation of Hospitalization: Anxiety Depression Medication stabilization Other; describe Selective   Estimated Length of Stay: 3-5 days  Attendees: Patient: Kathleen Key 03/08/2019 10:42 AM  Physician: Dr.Farah 03/08/2019 10:42 AM  Nursing: Vladimir Faster, RN 03/08/2019 10:42 AM  RN Care Manager: 03/08/2019 10:42 AM  Social Worker: Stephanie Acre, [place] 03/08/2019 10:42 AM  Recreational Therapist:  03/08/2019 10:42 AM  Other:  03/08/2019 10:42 AM  Other:  03/08/2019 10:42 AM  Other: 03/08/2019 10:42 AM    Scribe for Treatment Team: Joellen Jersey, [place] 03/08/2019 10:42 AM

## 2019-03-08 NOTE — [facility] Group Notes (Signed)
LCSW Group Therapy Notes 03/08/2019 3:11 PM  Type of Therapy and Topic: Group Therapy: Overcoming Obstacles  Participation Level: Did Not Attend  Description of Group:  In this group patients will be encouraged to explore what they see as obstacles to their own wellness and recovery. They will be guided to discuss their thoughts, feelings, and behaviors related to these obstacles. The group will process together ways to cope with barriers, with attention given to specific choices patients can make. Each patient will be challenged to identify changes they are motivated to make in order to overcome their obstacles. This group will be process-oriented, with patients participating in exploration of their own experiences as well as giving and receiving support and challenge from other group members.  Therapeutic Goals: 1. Patient will identify personal and current obstacles as they relate to admission. 2. Patient will identify barriers that currently interfere with their wellness or overcoming obstacles.  3. Patient will identify feelings, thought process and behaviors related to these barriers. 4. Patient will identify two changes they are willing to make to overcome these obstacles:   Summary of Patient Progress Patient sleeping.   Therapeutic Modalities:  Cognitive Behavioral Therapy Solution Focused Therapy Motivational Interviewing Relapse Prevention Therapy  Stephanie Acre, MSW, [facility] 03/08/2019 3:11 PM

## 2019-03-08 NOTE — Progress Notes (Signed)
Patient denies SI, HI and AVH this shift.  Patient has been resting in her room and complains of dizziness.  Vital signs taken and found to be within normal limits.   Assess patient for safety, offer medications as prescribed, engage patient in 1:1 staff talks.   Continue to monitor as planned. Patient able to contract for safety.

## 2019-03-08 NOTE — Progress Notes (Signed)
Recreation Therapy Notes  Date: 12.7.20 Time: 1000 Location: 500 Hall Dayroom  Group Topic: Coping Skills  Goal Area(s) Addresses:  Pt will identify healthy and unhealthy coping strategies. Pt will identify barriers to using healthy coping strategies. Pt will identify benefit of using healthy coping strategies post d/c.  Intervention:  Worksheet  Activity: Healthy vs. Unhealthy Coping Strategies.  Patients were to identify a problem they are dealing with.  Patients would then identify the unhealthy coping strategies that have been used to deal with the problem and the consequences of it.  Patients then identified healthy coping strategies, expected outcomes and barriers to using healthy coping strategies.  Education: Radiographer, therapeutic, Dentist.   Education Outcome: Acknowledges understanding/In group clarification offered/Needs additional education.   Clinical Observations/Feedback: Pt did not attend group session.     Victorino Sparrow, LRT/CTRS         Victorino Sparrow A 03/08/2019 1:09 PM

## 2019-03-09 MED ORDER — LORAZEPAM 0.5 MG PO TABS
0.5000 mg | ORAL_TABLET | Freq: Three times a day (TID) | ORAL | Status: DC
  Administered 2019-03-09 – 2019-03-12 (×10): 0.5 mg via ORAL
  Filled 2019-03-09 (×10): qty 1

## 2019-03-09 MED ORDER — RISPERIDONE 2 MG PO TABS
2.0000 mg | ORAL_TABLET | Freq: Every day | ORAL | Status: DC
  Administered 2019-03-09 – 2019-03-11 (×3): 2 mg via ORAL
  Filled 2019-03-09: qty 7
  Filled 2019-03-09 (×4): qty 1

## 2019-03-09 MED ORDER — RISPERIDONE 1 MG PO TABS
1.0000 mg | ORAL_TABLET | Freq: Every day | ORAL | Status: DC
  Administered 2019-03-09 – 2019-03-12 (×4): 1 mg via ORAL
  Filled 2019-03-09 (×7): qty 1

## 2019-03-09 MED ORDER — MODAFINIL 100 MG PO TABS
100.0000 mg | ORAL_TABLET | Freq: Every day | ORAL | Status: DC
  Administered 2019-03-09 – 2019-03-12 (×4): 100 mg via ORAL
  Filled 2019-03-09 (×4): qty 1

## 2019-03-09 NOTE — Progress Notes (Signed)
Recreation Therapy Notes  Date: 12.8.20 Time: 1000 Location: 500 Hall Dayroom  Group Topic: Wellness  Goal Area(s) Addresses:  Patient will define components of whole wellness. Patient will verbalize benefit of whole wellness.  Intervention: Music  Activity:  Exercise.  LRT led patients in a serious of stretches.  Patients then led group in an exercise of their choosing.  Group was to complete at least 30 minutes of exercise.  Patients could take breaks or get water as needed.  Education: Wellness, Dentist.   Education Outcome: Acknowledges education/In group clarification offered/Needs additional education.   Clinical Observations/Feedback: Pt did not attend group.    Victorino Sparrow, LRT/CTRS         Ria Comment, Kishia Shackett A 03/09/2019 11:08 AM

## 2019-03-09 NOTE — Progress Notes (Addendum)
[facility] MD Progress Note  03/09/2019 8:02 AM Kathleen Key  MRN:  [id number] Subjective:   Patient seen anxious and fidgety, and continues to be guarded. She is uncooperative and very difficult to hear. She was shaking throughout interview, and acknowledged being very cold. Her AC was on the room tempeture was 71 C. I turned the heat on and raised the tempeture to 75 C.  She denies auditory or visual hallucinations she is clearly no longer mute but gives minimal answers, extremely soft spoken.  States she is sleeping well does not know what to "make of" her reasons for hospitalization other than she was depressed. She expressed desire to go home. Denies feeling depressed, but is sad because she cant see her family.  Principal Problem: Severity of depression with mutism and psychotic features Diagnosis: Active Problems:   Severe recurrent major depression with psychotic features ([place])  Total Time spent with patient: 20 minutes  Past Psychiatric History: Did not elaborate but allows me to call her husband  Past Medical History:  Past Medical History:  Diagnosis Date  . Asthma   . Fibroids    uterine  . IBS (irritable bowel syndrome)   . Pulmonary sarcoidosis ([place])   . Shortness of breath dyspnea     Past Surgical History:  Procedure Laterality Date  . ABDOMINAL HYSTERECTOMY N/A 10/13/2012   Procedure: HYSTERECTOMY ABDOMINAL;  Surgeon: Jonnie Kind, MD;  Location: [facility];  Service: Gynecology;  Laterality: N/A;  . BILATERAL SALPINGECTOMY Bilateral 10/13/2012   Procedure: BILATERAL SALPINGECTOMY;  Surgeon: Jonnie Kind, MD;  Location: [facility];  Service: Gynecology;  Laterality: Bilateral;  . BREAST BIOPSY Left 05/2015  . BREAST EXCISIONAL BIOPSY Left 05/2015  . BREAST LUMPECTOMY WITH RADIOACTIVE SEED LOCALIZATION Left 07/28/2015   Procedure: LEFT BREAST LUMPECTOMY WITH RADIOACTIVE SEED LOCALIZATION;  Surgeon: Autumn Messing III, MD;  Location: [facility];  Service: General;   Laterality: Left;  . WISDOM TOOTH EXTRACTION     Dr. Georgina Snell office-[facility], [place], [place]   Family History:  Family History  Problem Relation Age of Onset  . Heart disease Mother        leaky valve  . Asthma Mother   . Hypertension Father        pacemaker  . Stroke Father   . Heart disease Father        pacemaker  . Fibromyalgia Sister   . Neuropathy Sister    Family Psychiatric  History: neg Social History:  Social History   Substance and Sexual Activity  Alcohol Use Yes   Comment: occassional     Social History   Substance and Sexual Activity  Drug Use No    Social History   Socioeconomic History  . Marital status: Married    Spouse name: Not on file  . Number of children: Not on file  . Years of education: Not on file  . Highest education level: Not on file  Occupational History  . Not on file  Social Needs  . Financial resource strain: Not on file  . Food insecurity    Worry: Not on file    Inability: Not on file  . Transportation needs    Medical: Not on file    Non-medical: Not on file  Tobacco Use  . Smoking status: Never Smoker  . Smokeless tobacco: Never Used  Substance and Sexual Activity  . Alcohol use: Yes    Comment: occassional  . Drug use: No  . Sexual  activity: Yes    Birth control/protection: Surgical  Lifestyle  . Physical activity    Days per week: Not on file    Minutes per session: Not on file  . Stress: Not on file  Relationships  . Social Herbalist on phone: Not on file    Gets together: Not on file    Attends religious service: Not on file    Active member of club or organization: Not on file    Attends meetings of clubs or organizations: Not on file    Relationship status: Not on file  Other Topics Concern  . Not on file  Social History Narrative  . Not on file   Additional Social History:    Pain Medications: see MAR Prescriptions: see MAR Over the Counter: see MAR History of alcohol  / drug use?: No history of alcohol / drug abuse                    Sleep: Good, 7 hours total   Appetite:  Good  Current Medications: Current Facility-Administered Medications  Medication Dose Route Frequency Provider Last Rate Last Dose  . albuterol (VENTOLIN HFA) 108 (90 Base) MCG/ACT inhaler 1-2 puff  1-2 puff Inhalation Q6H PRN Sharma Covert, MD      . alum & mag hydroxide-simeth (MAALOX/MYLANTA) 200-200-20 MG/5ML suspension 30 mL  30 mL Oral Q4H PRN Lindon Romp A, NP      . feeding supplement (ENSURE ENLIVE) (ENSURE ENLIVE) liquid 237 mL  237 mL Oral BID BM Sharma Covert, MD   237 mL at 03/08/19 1633  . haloperidol lactate (HALDOL) injection 5 mg  5 mg Intramuscular BID Sharma Covert, MD   5 mg at 03/06/19 1814  . hydrOXYzine (ATARAX/VISTARIL) tablet 25 mg  25 mg Oral TID PRN Rozetta Nunnery, NP   25 mg at 03/08/19 2007  . LORazepam (ATIVAN) tablet 1 mg  1 mg Oral Q8H PRN Sharma Covert, MD      . LORazepam (ATIVAN) tablet 1 mg  1 mg Oral TID Johnn Hai, MD   1 mg at 03/09/19 0732  . magnesium hydroxide (MILK OF MAGNESIA) suspension 30 mL  30 mL Oral Daily PRN Lindon Romp A, NP      . multivitamin with minerals tablet 1 tablet  1 tablet Oral Daily Hampton Abbot, MD   1 tablet at 03/09/19 0732  . risperiDONE (RISPERDAL M-TABS) disintegrating tablet 2 mg  2 mg Oral BID Sharma Covert, MD   2 mg at 03/09/19 0732  . sertraline (ZOLOFT) tablet 200 mg  200 mg Oral Daily Sharma Covert, MD   200 mg at 03/09/19 0732  . traZODone (DESYREL) tablet 50 mg  50 mg Oral QHS PRN Rozetta Nunnery, NP   50 mg at 03/06/19 2127    Lab Results:  Results for orders placed or performed during the hospital encounter of 03/05/19 (from the past 48 hour(s))  Pregnancy, urine     Status: None   Collection Time: 03/07/19 10:17 AM  Result Value Ref Range   Preg Test, Ur NEGATIVE NEGATIVE    Comment:        THE SENSITIVITY OF THIS METHODOLOGY IS >20 mIU/mL. Performed at  [facility], [place] [address], [place], [place] [address]   Rapid urine drug screen (hospital performed)     Status: Abnormal   Collection Time: 03/07/19 10:17 AM  Result Value Ref Range  Opiates NONE DETECTED NONE DETECTED   Cocaine NONE DETECTED NONE DETECTED   Benzodiazepines NONE DETECTED NONE DETECTED   Amphetamines POSITIVE (A) NONE DETECTED   Tetrahydrocannabinol NONE DETECTED NONE DETECTED   Barbiturates NONE DETECTED NONE DETECTED    Comment: (NOTE) DRUG SCREEN FOR MEDICAL PURPOSES ONLY.  IF CONFIRMATION IS NEEDED FOR ANY PURPOSE, NOTIFY LAB WITHIN 5 DAYS. LOWEST DETECTABLE LIMITS FOR URINE DRUG SCREEN Drug Class                     Cutoff (ng/mL) Amphetamine and metabolites    1000 Barbiturate and metabolites    200 Benzodiazepine                 [id number] Tricyclics and metabolites     300 Opiates and metabolites        300 Cocaine and metabolites        300 THC                            50 Performed at [facility], [place] [address], [place], [place] [address]     Blood Alcohol level:  No results found for: [facility]  Metabolic Disorder Labs: Lab Results  Component Value Date   HGBA1C 5.6 03/07/2019   MPG 114.02 03/07/2019   No results found for: PROLACTIN Lab Results  Component Value Date   CHOL 194 03/07/2019   TRIG 78 03/07/2019   HDL 74 03/07/2019   CHOLHDL 2.6 03/07/2019   VLDL 16 03/07/2019   LDLCALC 104 (H) 03/07/2019    Physical Findings: AIMS: Facial and Oral Movements Muscles of Facial Expression: None, normal Lips and Perioral Area: None, normal Jaw: None, normal Tongue: None, normal,Extremity Movements Upper (arms, wrists, hands, fingers): None, normal Lower (legs, knees, ankles, toes): None, normal, Trunk Movements Neck, shoulders, hips: None, normal, Overall Severity Severity of abnormal movements (highest score from questions above): None, normal Incapacitation due to abnormal movements: None,  normal Patient's awareness of abnormal movements (rate only patient's report): No Awareness, Dental Status Current problems with teeth and/or dentures?: No Does patient usually wear dentures?: No  CIWA:  CIWA-Ar Total: 0 COWS:  COWS Total Score: 1  Musculoskeletal: Strength & Muscle Tone: within normal limits Gait & Station: normal Patient leans: N/A  Psychiatric Specialty Exam: Physical Exam  ROS  Blood pressure 124/83, pulse (!) 124, temperature (!) 97.5 F (36.4 C), temperature source Oral, resp. rate 18, height 5' 3.5" (1.613 m), weight 58.5 kg, last menstrual period 10/08/2012, SpO2 100 %.Body mass index is 22.49 kg/m.  General Appearance: Casual  Eye Contact:  Fair  Speech:  Slow, and very soft and difficult to understand  Volume:  Decreased  Mood:  Anxious   Affect:  Blunt  Thought Process:  Linear and Descriptions of Associations: Circumstantial  Orientation:  NA  Thought Content:  Rumination  Suicidal Thoughts:  No  Homicidal Thoughts:  neg  Memory:  NA  Judgement:  Fair  Insight:  Fair  Psychomotor Activity:  Normal  Concentration:  Concentration: Fair and Attention Span: Fair  Recall:  [employer] of Knowledge:  Fair  Language:  Fair  Akathisia:  Negative  Handed:  Right  AIMS (if indicated):     Assets:  Communication Skills Desire for Improvement  ADL's:  Intact  Cognition:  WNL  Sleep:  Number of Hours: 7     Treatment Plan Summary: Daily contact with patient to  assess and evaluate symptoms and progress in treatment and Medication management  Continue cognitive therapy continue reality based therapy escalate lorazepam due to anxiety continue antidepressant antipsychotic therapy continue to monitor for safety on 15-minute precautions discussed with family. Add modafinil continue to address cognitive therapy Trinna Post, Medical Student 03/09/2019, 8:02 AM

## 2019-03-09 NOTE — Progress Notes (Signed)
Recreation Therapy Notes  INPATIENT RECREATION THERAPY ASSESSMENT  Patient Details Name: TENEISHA SHULER MRN: [id number] DOB: 04-20-1965 Today's Date: 03/09/2019       Information Obtained From: Patient  Able to Participate in Assessment/Interview: Yes  Patient Presentation: Responsive(Drowsy)  Reason for Admission (Per Patient): Other (Comments)(Voices)  Patient Stressors: Work  Radiographer, therapeutic:   Building control surveyor, Music, Talk, Prayer, Occupational hygienist, Read  Leisure Interests (2+):  Exercise - Walking  Frequency of Recreation/Participation: Other (Comment)(When the weather is nice)  Awareness of Community Resources:  Yes  Community Resources:  [place]  Current Use: Yes  If no, Barriers?:    Expressed Interest in [employer] Information: No  County of Residence:  [place]  Patient Main Form of Transportation: Car  Patient Strengths:  Decent singer  Patient Identified Areas of Improvement:  "all of them"  Patient Goal for Hospitalization:  "get out"  Current SI (including self-harm):  No  Current HI:  No  Current AVH: No  Staff Intervention Plan: Group Attendance, Collaborate with Interdisciplinary Treatment Team  Consent to Intern Participation: N/A   Victorino Sparrow, LRT/CTRS  Victorino Sparrow A 03/09/2019, 1:46 PM

## 2019-03-09 NOTE — Progress Notes (Signed)
Psychoeducational Group Note  Date:  03/09/2019 Time:  2021  Group Topic/Focus:  Wrap-Up Group:   The focus of this group is to help patients review their daily goal of treatment and discuss progress on daily workbooks.  Participation Level: Did Not Attend  Participation Quality:  Not Applicable  Affect:  Not Applicable  Cognitive:  Not Applicable  Insight:  Not Applicable  Engagement in Group: Not Applicable  Additional Comments:  The patient did not attend group this evening.   Makiah Clauson S 03/09/2019, 8:21 PM

## 2019-03-09 NOTE — Progress Notes (Signed)
Patient denies SI, HI and AVH this shift.  Patient has been resting in her room isolative.  Patient reports stomach cramping and discomfort.  Patient affect is flat and patient forward little.   Assess patient for safety, offer medications as prescribed, engage patient in 1:1 staff talks.   Continue to monitor as planned. Patient able to contract for safety.

## 2019-03-09 NOTE — Progress Notes (Signed)
   03/09/19 2100  Psych Admission Type (Psych Patients Only)  Admission Status Voluntary  Psychosocial Assessment  Patient Complaints Anxiety  Eye Contact Fair  Facial Expression Flat  Affect Sad;Flat  Speech Logical/coherent;Slow  Interaction No initiation  Motor Activity Slow  Appearance/Hygiene Unremarkable  Behavior Characteristics Cooperative  Mood Pleasant  Thought Process  Coherency WDL  Content WDL  Delusions None reported or observed  Perception WDL  Hallucination None reported or observed  Judgment Poor  Confusion Mild  Danger to Self  Current suicidal ideation? Denies  Self-Injurious Behavior No self-injurious ideation or behavior indicators observed or expressed   Danger to Others  Danger to Others None reported or observed

## 2019-03-10 NOTE — Progress Notes (Addendum)
[facility] MD Progress Note  03/10/2019 9:22 AM Kathleen Key  MRN:  [id number] Subjective:   Patient is less anxious and fidgety. Possibly reacting well to medications. Once explained that the only reason she is here is for concern of depression,  She became a little less guarded and cooperative. She is speaking louder and is more mobile.   She denies HI, SI,  auditory and visual hallucinations.  Has some chronic abdominal pain that got better when drinking an ensure.   Principal Problem: Severity of depression with mutism and psychotic features Diagnosis: Active Problems:   Severe recurrent major depression with psychotic features ([place])  Total Time spent with patient: 20 minutes  Past Psychiatric History: Did not elaborate but allows me to call her husband  Past Medical History:  Past Medical History:  Diagnosis Date  . Asthma   . Fibroids    uterine  . IBS (irritable bowel syndrome)   . Pulmonary sarcoidosis ([place])   . Shortness of breath dyspnea     Past Surgical History:  Procedure Laterality Date  . ABDOMINAL HYSTERECTOMY N/A 10/13/2012   Procedure: HYSTERECTOMY ABDOMINAL;  Surgeon: Jonnie Kind, MD;  Location: [facility];  Service: Gynecology;  Laterality: N/A;  . BILATERAL SALPINGECTOMY Bilateral 10/13/2012   Procedure: BILATERAL SALPINGECTOMY;  Surgeon: Jonnie Kind, MD;  Location: [facility];  Service: Gynecology;  Laterality: Bilateral;  . BREAST BIOPSY Left 05/2015  . BREAST EXCISIONAL BIOPSY Left 05/2015  . BREAST LUMPECTOMY WITH RADIOACTIVE SEED LOCALIZATION Left 07/28/2015   Procedure: LEFT BREAST LUMPECTOMY WITH RADIOACTIVE SEED LOCALIZATION;  Surgeon: Autumn Messing III, MD;  Location: [place];  Service: General;  Laterality: Left;  . WISDOM TOOTH EXTRACTION     Dr. Georgina Snell office-[facility], [place], [place]   Family History:  Family History  Problem Relation Age of Onset  . Heart disease Mother        leaky valve  . Asthma Mother   .  Hypertension Father        pacemaker  . Stroke Father   . Heart disease Father        pacemaker  . Fibromyalgia Sister   . Neuropathy Sister    Family Psychiatric  History: neg Social History:  Social History   Substance and Sexual Activity  Alcohol Use Yes   Comment: occassional     Social History   Substance and Sexual Activity  Drug Use No    Social History   Socioeconomic History  . Marital status: Married    Spouse name: Not on file  . Number of children: Not on file  . Years of education: Not on file  . Highest education level: Not on file  Occupational History  . Not on file  Social Needs  . Financial resource strain: Not on file  . Food insecurity    Worry: Not on file    Inability: Not on file  . Transportation needs    Medical: Not on file    Non-medical: Not on file  Tobacco Use  . Smoking status: Never Smoker  . Smokeless tobacco: Never Used  Substance and Sexual Activity  . Alcohol use: Yes    Comment: occassional  . Drug use: No  . Sexual activity: Yes    Birth control/protection: Surgical  Lifestyle  . Physical activity    Days per week: Not on file    Minutes per session: Not on file  . Stress: Not on file  Relationships  . Social  connections    Talks on phone: Not on file    Gets together: Not on file    Attends religious service: Not on file    Active member of club or organization: Not on file    Attends meetings of clubs or organizations: Not on file    Relationship status: Not on file  Other Topics Concern  . Not on file  Social History Narrative  . Not on file   Additional Social History:    Pain Medications: see MAR Prescriptions: see MAR Over the Counter: see MAR History of alcohol / drug use?: No history of alcohol / drug abuse                    Sleep: Good, 7 hours total   Appetite:  Good  Current Medications: Current Facility-Administered Medications  Medication Dose Route Frequency Provider Last Rate  Last Dose  . albuterol (VENTOLIN HFA) 108 (90 Base) MCG/ACT inhaler 1-2 puff  1-2 puff Inhalation Q6H PRN Sharma Covert, MD      . alum & mag hydroxide-simeth (MAALOX/MYLANTA) 200-200-20 MG/5ML suspension 30 mL  30 mL Oral Q4H PRN Lindon Romp A, NP      . feeding supplement (ENSURE ENLIVE) (ENSURE ENLIVE) liquid 237 mL  237 mL Oral BID BM Sharma Covert, MD   237 mL at 03/09/19 1301  . haloperidol lactate (HALDOL) injection 5 mg  5 mg Intramuscular BID Sharma Covert, MD   5 mg at 03/06/19 1814  . hydrOXYzine (ATARAX/VISTARIL) tablet 25 mg  25 mg Oral TID PRN Rozetta Nunnery, NP   25 mg at 03/08/19 2007  . LORazepam (ATIVAN) tablet 0.5 mg  0.5 mg Oral TID Johnn Hai, MD   0.5 mg at 03/10/19 0743  . LORazepam (ATIVAN) tablet 1 mg  1 mg Oral Q8H PRN Sharma Covert, MD      . magnesium hydroxide (MILK OF MAGNESIA) suspension 30 mL  30 mL Oral Daily PRN Lindon Romp A, NP      . modafinil (PROVIGIL) tablet 100 mg  100 mg Oral Daily Johnn Hai, MD   100 mg at 03/10/19 0743  . multivitamin with minerals tablet 1 tablet  1 tablet Oral Daily Hampton Abbot, MD   1 tablet at 03/10/19 0741  . risperiDONE (RISPERDAL) tablet 1 mg  1 mg Oral Daily Johnn Hai, MD   1 mg at 03/10/19 0741  . risperiDONE (RISPERDAL) tablet 2 mg  2 mg Oral QHS Johnn Hai, MD   2 mg at 03/09/19 2011  . sertraline (ZOLOFT) tablet 200 mg  200 mg Oral Daily Sharma Covert, MD   200 mg at 03/10/19 0741  . traZODone (DESYREL) tablet 50 mg  50 mg Oral QHS PRN Rozetta Nunnery, NP   50 mg at 03/06/19 2127    Lab Results:  No results found for this or any previous visit (from the past 48 hour(s)).  Blood Alcohol level:  No results found for: [facility]  Metabolic Disorder Labs: Lab Results  Component Value Date   HGBA1C 5.6 03/07/2019   MPG 114.02 03/07/2019   No results found for: PROLACTIN Lab Results  Component Value Date   CHOL 194 03/07/2019   TRIG 78 03/07/2019   HDL 74 03/07/2019   CHOLHDL 2.6  03/07/2019   VLDL 16 03/07/2019   LDLCALC 104 (H) 03/07/2019    Physical Findings: AIMS: Facial and Oral Movements Muscles of Facial Expression: None, normal Lips and  Perioral Area: None, normal Jaw: None, normal Tongue: None, normal,Extremity Movements Upper (arms, wrists, hands, fingers): None, normal Lower (legs, knees, ankles, toes): None, normal, Trunk Movements Neck, shoulders, hips: None, normal, Overall Severity Severity of abnormal movements (highest score from questions above): None, normal Incapacitation due to abnormal movements: None, normal Patient's awareness of abnormal movements (rate only patient's report): No Awareness, Dental Status Current problems with teeth and/or dentures?: No Does patient usually wear dentures?: No  CIWA:  CIWA-Ar Total: 0 COWS:  COWS Total Score: 1  Musculoskeletal: Strength & Muscle Tone: within normal limits Gait & Station: normal Patient leans: N/A  Psychiatric Specialty Exam: Physical Exam  ROS  Blood pressure 124/83, pulse (!) 124, temperature (!) 97.5 F (36.4 C), temperature source Oral, resp. rate 18, height 5' 3.5" (1.613 m), weight 58.5 kg, last menstrual period 10/08/2012, SpO2 100 %.Body mass index is 22.49 kg/m.  General Appearance: Casual  Eye Contact:  Fair  Speech:  Slow, and very soft and difficult to understand  Volume:  Decreased  Mood:  Anxious   Affect:  Blunt  Thought Process:  Linear and Descriptions of Associations: Circumstantial  Orientation:  NA  Thought Content:  Rumination  Suicidal Thoughts:  No  Homicidal Thoughts:  neg  Memory:  NA  Judgement:  Fair  Insight:  Fair  Psychomotor Activity:  Normal  Concentration:  Concentration: Fair and Attention Span: Fair  Recall:  [employer] of Knowledge:  Fair  Language:  NA  Akathisia:  Negative  Handed:  Right  AIMS (if indicated):     Assets:  Communication Skills Desire for Improvement  ADL's:  Intact  Cognition:  WNL  Sleep:  Number of Hours:  7     Treatment Plan Summary: Daily contact with patient to assess and evaluate symptoms and progress in treatment and Medication management  Continue medication regime. Assess changing adderall extended release, to rapid release, since extended release may be inhibiting her appetite too much. And she is too fragile to release today    Trinna Post, Medical Student 03/10/2019, 9:22 AM

## 2019-03-10 NOTE — Progress Notes (Signed)
   03/10/19 2200  Psych Admission Type (Psych Patients Only)  Admission Status Voluntary  Psychosocial Assessment  Patient Complaints Anxiety  Eye Contact Fair  Facial Expression Flat  Affect Sad;Flat  Speech Logical/coherent;Slow  Interaction No initiation  Motor Activity Slow  Appearance/Hygiene Unremarkable  Behavior Characteristics Cooperative  Mood Pleasant  Thought Process  Coherency WDL  Content WDL  Delusions None reported or observed  Perception WDL  Hallucination None reported or observed  Judgment Poor  Confusion Mild  Danger to Self  Current suicidal ideation? Denies  Self-Injurious Behavior No self-injurious ideation or behavior indicators observed or expressed   Danger to Others  Danger to Others None reported or observed

## 2019-03-11 NOTE — Progress Notes (Signed)
Psychoeducational Group Note  Date:  03/11/2019 Time: 2040  Group Topic/Focus:  Wrap-Up Group:   The focus of this group is to help patients review their daily goal of treatment and discuss progress on daily workbooks.  Participation Level: Did Not Attend  Participation Quality:  Not Applicable  Affect:  Not Applicable  Cognitive:  Not Applicable  Insight:  Not Applicable  Engagement in Group: Not Applicable  Additional Comments:  The patient did not attend group this evening.   Dereka Lueras S 03/11/2019, 8:40 PM

## 2019-03-11 NOTE — Progress Notes (Signed)
Recreation Therapy Notes  Date: 12.10.20 Time: 1000 Location: 500 Hall Dayroom  Group Topic: Communication, Team Building, Problem Solving  Goal Area(s) Addresses:  Patient will effectively work with peer towards shared goal.  Patient will identify skill used to make activity successful.  Patient will identify how skills used during activity can be used to reach post d/c goals.   Behavioral Response: Engaged  Intervention: STEM Activity   Activity: In team's, using 10 red plastic cups, patients were asked to build a free standing tower possible.    Education: Education officer, community, Dentist.   Education Outcome: Acknowledges education/In group clarification offered/Needs additional education.   Clinical Observations/Feedback: Pt stated the end (putting the last cup up) was hard.  Pt also stated the group had to use eye contact to complete the activity.  Pt also expressed that with their support system they have to "put it all together".       Victorino Sparrow, LRT/CTRS     Ria Comment, Waqas Bruhl A 03/11/2019 11:05 AM

## 2019-03-11 NOTE — Progress Notes (Addendum)
[facility] MD Progress Note  03/11/2019 8:59 AM Kathleen Key  MRN:  [id number] Subjective:   Patient is calm and describes her mood as "ok" and feels sleepy today. Possibly reacting well to medications. She is alert to place and time and has insight to her reason for being admitted saying "I'm here for the voices I was hearing but not anymore". She is speaking louder, more open to conversation and less guarded than her day of admission.   She denies HI, SI,  auditory and visual hallucinations.  Her abdominal pain has gotten better and she was able to eat a bit of breakfast today.  She had concerns for whether the medication was making her sleepy, I also encouraged her to try and get out of bed and walk around.   Principal Problem: Severity of depression with mutism and psychotic features Diagnosis: Active Problems:   Severe recurrent major depression with psychotic features ([place])  Total Time spent with patient: 15 minutes  Past Psychiatric History: Did not elaborate but allows me to call her husband  Past Medical History:  Past Medical History:  Diagnosis Date  . Asthma   . Fibroids    uterine  . IBS (irritable bowel syndrome)   . Pulmonary sarcoidosis ([place])   . Shortness of breath dyspnea     Past Surgical History:  Procedure Laterality Date  . ABDOMINAL HYSTERECTOMY N/A 10/13/2012   Procedure: HYSTERECTOMY ABDOMINAL;  Surgeon: Jonnie Kind, MD;  Location: [facility];  Service: Gynecology;  Laterality: N/A;  . BILATERAL SALPINGECTOMY Bilateral 10/13/2012   Procedure: BILATERAL SALPINGECTOMY;  Surgeon: Jonnie Kind, MD;  Location: [facility];  Service: Gynecology;  Laterality: Bilateral;  . BREAST BIOPSY Left 05/2015  . BREAST EXCISIONAL BIOPSY Left 05/2015  . BREAST LUMPECTOMY WITH RADIOACTIVE SEED LOCALIZATION Left 07/28/2015   Procedure: LEFT BREAST LUMPECTOMY WITH RADIOACTIVE SEED LOCALIZATION;  Surgeon: Autumn Messing III, MD;  Location: [place];  Service: General;   Laterality: Left;  . WISDOM TOOTH EXTRACTION     Dr. Georgina Snell office-[facility], [place], [place]   Family History:  Family History  Problem Relation Age of Onset  . Heart disease Mother        leaky valve  . Asthma Mother   . Hypertension Father        pacemaker  . Stroke Father   . Heart disease Father        pacemaker  . Fibromyalgia Sister   . Neuropathy Sister    Family Psychiatric  History: neg Social History:  Social History   Substance and Sexual Activity  Alcohol Use Yes   Comment: occassional     Social History   Substance and Sexual Activity  Drug Use No    Social History   Socioeconomic History  . Marital status: Married    Spouse name: Not on file  . Number of children: Not on file  . Years of education: Not on file  . Highest education level: Not on file  Occupational History  . Not on file  Tobacco Use  . Smoking status: Never Smoker  . Smokeless tobacco: Never Used  Substance and Sexual Activity  . Alcohol use: Yes    Comment: occassional  . Drug use: No  . Sexual activity: Yes    Birth control/protection: Surgical  Other Topics Concern  . Not on file  Social History Narrative  . Not on file   Social Determinants of Health   Financial Resource Strain:   .  Difficulty of Paying Living Expenses: Not on file  Food Insecurity:   . Worried About Charity fundraiser in the Last Year: Not on file  . Ran Out of Food in the Last Year: Not on file  Transportation Needs:   . Lack of Transportation (Medical): Not on file  . Lack of Transportation (Non-Medical): Not on file  Physical Activity:   . Days of Exercise per Week: Not on file  . Minutes of Exercise per Session: Not on file  Stress:   . Feeling of Stress : Not on file  Social Connections:   . Frequency of Communication with Friends and Family: Not on file  . Frequency of Social Gatherings with Friends and Family: Not on file  . Attends Religious Services: Not on file  .  Active Member of Clubs or Organizations: Not on file  . Attends Archivist Meetings: Not on file  . Marital Status: Not on file   Additional Social History:    Pain Medications: see MAR Prescriptions: see MAR Over the Counter: see MAR History of alcohol / drug use?: No history of alcohol / drug abuse                    Sleep: Good, 7 hours total   Appetite:  Good  Current Medications: Current Facility-Administered Medications  Medication Dose Route Frequency Provider Last Rate Last Admin  . albuterol (VENTOLIN HFA) 108 (90 Base) MCG/ACT inhaler 1-2 puff  1-2 puff Inhalation Q6H PRN Sharma Covert, MD      . alum & mag hydroxide-simeth (MAALOX/MYLANTA) 200-200-20 MG/5ML suspension 30 mL  30 mL Oral Q4H PRN Lindon Romp A, NP      . feeding supplement (ENSURE ENLIVE) (ENSURE ENLIVE) liquid 237 mL  237 mL Oral BID BM Sharma Covert, MD   237 mL at 03/10/19 1320  . haloperidol lactate (HALDOL) injection 5 mg  5 mg Intramuscular BID Sharma Covert, MD   5 mg at 03/06/19 1814  . hydrOXYzine (ATARAX/VISTARIL) tablet 25 mg  25 mg Oral TID PRN Rozetta Nunnery, NP   25 mg at 03/08/19 2007  . LORazepam (ATIVAN) tablet 0.5 mg  0.5 mg Oral TID Johnn Hai, MD   0.5 mg at 03/11/19 0740  . LORazepam (ATIVAN) tablet 1 mg  1 mg Oral Q8H PRN Sharma Covert, MD      . magnesium hydroxide (MILK OF MAGNESIA) suspension 30 mL  30 mL Oral Daily PRN Lindon Romp A, NP      . modafinil (PROVIGIL) tablet 100 mg  100 mg Oral Daily Johnn Hai, MD   100 mg at 03/11/19 0740  . multivitamin with minerals tablet 1 tablet  1 tablet Oral Daily Hampton Abbot, MD   1 tablet at 03/11/19 0739  . risperiDONE (RISPERDAL) tablet 1 mg  1 mg Oral Daily Johnn Hai, MD   1 mg at 03/11/19 0739  . risperiDONE (RISPERDAL) tablet 2 mg  2 mg Oral QHS Johnn Hai, MD   2 mg at 03/10/19 2107  . sertraline (ZOLOFT) tablet 200 mg  200 mg Oral Daily Sharma Covert, MD   200 mg at 03/11/19 0739   . traZODone (DESYREL) tablet 50 mg  50 mg Oral QHS PRN Rozetta Nunnery, NP   50 mg at 03/06/19 2127    Lab Results:  No results found for this or any previous visit (from the past 48 hour(s)).  Blood Alcohol level:  No  results found for: [facility]  Metabolic Disorder Labs: Lab Results  Component Value Date   HGBA1C 5.6 03/07/2019   MPG 114.02 03/07/2019   No results found for: PROLACTIN Lab Results  Component Value Date   CHOL 194 03/07/2019   TRIG 78 03/07/2019   HDL 74 03/07/2019   CHOLHDL 2.6 03/07/2019   VLDL 16 03/07/2019   LDLCALC 104 (H) 03/07/2019    Physical Findings: AIMS: Facial and Oral Movements Muscles of Facial Expression: None, normal Lips and Perioral Area: None, normal Jaw: None, normal Tongue: None, normal,Extremity Movements Upper (arms, wrists, hands, fingers): None, normal Lower (legs, knees, ankles, toes): None, normal, Trunk Movements Neck, shoulders, hips: None, normal, Overall Severity Severity of abnormal movements (highest score from questions above): None, normal Incapacitation due to abnormal movements: None, normal Patient's awareness of abnormal movements (rate only patient's report): No Awareness, Dental Status Current problems with teeth and/or dentures?: No Does patient usually wear dentures?: No  CIWA:  CIWA-Ar Total: 0 COWS:  COWS Total Score: 1  Musculoskeletal: Strength & Muscle Tone: within normal limits Gait & Station: normal Patient leans: N/A  Psychiatric Specialty Exam: Physical Exam  ROS  Blood pressure 103/84, pulse (!) 115, temperature (!) 97.5 F (36.4 C), temperature source Oral, resp. rate 18, height 5' 3.5" (1.613 m), weight 58.5 kg, last menstrual period 10/08/2012, SpO2 100 %.Body mass index is 22.49 kg/m.  General Appearance: Casual  Eye Contact:  Good  Speech:  Slow, and soft but not difficult to understand  Volume:  Decreased  Mood:  Euthymic   Affect:  Blunt  Thought Process:  Linear and Descriptions of  Associations: Circumstantial  Orientation:  Full (Time, Place, and Person)  Thought Content:  Logical and Rumination  Suicidal Thoughts:  No  Homicidal Thoughts:  neg  Memory:  NA  Judgement:  Fair  Insight:  Fair  Psychomotor Activity:  Normal  Concentration:  Concentration: Fair and Attention Span: Fair  Recall:  [employer] of Knowledge:  Fair  Language:  NA  Akathisia:  Negative  Handed:  Right  AIMS (if indicated):     Assets:  Communication Skills Desire for Improvement  ADL's:  Intact  Cognition:  WNL  Sleep:  Number of Hours: 7.5     Treatment Plan Summary: Daily contact with patient to assess and evaluate symptoms and progress in treatment and Medication management  Continue medication regime. Consider adjusting modafinil dosage due to day time fatigue/sedation in combination with lorazepam    Billey Chang, Medical Student 03/11/2019, 8:59 AM

## 2019-03-11 NOTE — Progress Notes (Signed)
   03/11/19 2200  Psych Admission Type (Psych Patients Only)  Admission Status Voluntary  Psychosocial Assessment  Patient Complaints Suspiciousness  Eye Contact Fair  Facial Expression Flat  Affect Sad;Flat  Speech Logical/coherent;Slow  Interaction No initiation  Motor Activity Slow  Appearance/Hygiene Unremarkable  Behavior Characteristics Cooperative  Mood Preoccupied;Pleasant  Thought Process  Coherency WDL  Content WDL  Delusions None reported or observed  Perception WDL  Hallucination None reported or observed  Judgment Poor  Confusion Mild  Danger to Self  Current suicidal ideation? Denies  Self-Injurious Behavior No self-injurious ideation or behavior indicators observed or expressed   Danger to Others  Danger to Others None reported or observed   Pt stated she was ready to D/C tomorrow. Pt less paranoid this evening.

## 2019-03-12 DIAGNOSIS — F333 Major depressive disorder, recurrent, severe with psychotic symptoms: Principal | ICD-10-CM

## 2019-03-12 MED ORDER — LORAZEPAM 0.5 MG PO TABS: 0.5000 mg | ORAL_TABLET | Freq: Three times a day (TID) | ORAL | 0 refills | Status: DC

## 2019-03-12 MED ORDER — ENLYTE PO CAPS: 1.0000 | ORAL_CAPSULE | Freq: Every day | ORAL | 11 refills | Status: DC

## 2019-03-12 MED ORDER — TRAZODONE HCL 50 MG PO TABS: 50.0000 mg | ORAL_TABLET | Freq: Every evening | ORAL | 1 refills | Status: DC | PRN

## 2019-03-12 MED ORDER — RISPERIDONE 2 MG PO TABS: 2.0000 mg | ORAL_TABLET | Freq: Every day | ORAL | 1 refills | Status: DC

## 2019-03-12 NOTE — Progress Notes (Signed)
  [facility] Adult Case Management Discharge Plan :  Will you be returning to the same living situation after discharge:  Yes,  home. At discharge, do you have transportation home?: Yes,  sister will pick up after 11am. Do you have the ability to pay for your medications: No. Provided samples.  Release of information consent forms completed and in the chart; letter on chart.  Patient to Follow up at: Follow-up [place] [facility] Follow up.   Why: You are scheduled for a phone visit on Monday, December 14th at 8am. Thank you. Contact information:  [address] [place],  [place], [place] [address] Ph: [phone] Fax:          Next level of care provider has access to [place] and Suicide Prevention discussed: Yes,  with husband.  Have you used any form of tobacco in the last 30 days? (Cigarettes, Smokeless Tobacco, Cigars, and/or Pipes): No  Has patient been referred to the Quitline?: N/A patient is not a smoker  Patient has been referred for addiction treatment: Yes  Joellen Jersey, [place] 03/12/2019, 9:03 AM

## 2019-03-12 NOTE — Progress Notes (Signed)
Recreation Therapy Notes  Date: 12.11.20 Time: 1000 Location: 500 Hall Dayroom  Group Topic: Self-Esteem  Goal Area(s) Addresses:  Patient will successfully identify positive attributes about themselves.  Patient will successfully identify benefit of improved self-esteem.   Intervention: Architect paper, colored pencils, music  Activity: Personalized License Plate.  Patients were to create a personalized license plate that focused on the good things about themselves.  Patients could also highlight important dates, accomplishments or things hoped to accomplish.  Education:  Self-Esteem, Dentist.   Education Outcome: Acknowledges education/In group clarification offered/Needs additional education  Clinical Observations/Feedback:  Pt did not attend group.    Victorino Sparrow, LRT/CTRS      Victorino Sparrow A 03/12/2019 11:53 AM

## 2019-03-12 NOTE — Progress Notes (Signed)
Pt discharged to lobby. Pt was stable and appreciative at that time. All papers, samples and prescriptions were given and valuables returned. Verbal understanding expressed. Denies SI/HI and A/VH. Pt given opportunity to express concerns and ask questions.  

## 2019-03-12 NOTE — BHH Suicide Risk Assessment (Signed)
[facility] Discharge Suicide Risk Assessment   Principal Problem: Severe depression with psychosis Discharge Diagnoses: Active Problems:   Severe recurrent major depression with psychotic features ([place])   Total Time spent with patient: 45 minutes Musculoskeletal: Strength & Muscle Tone: within normal limits Gait & Station: normal Patient leans: N/A  Psychiatric Specialty Exam: Physical Exam  Review of Systems  Blood pressure 116/86, pulse (!) 110, temperature 97.6 F (36.4 C), temperature source Oral, resp. rate 18, height 5' 3.5" (1.613 m), weight 58.5 kg, last menstrual period 10/08/2012, SpO2 100 %.Body mass index is 22.49 kg/m.  General Appearance: Casual  Eye Contact:  Good  Speech:  Clear and Coherent  Volume:  Decreased  Mood:  Dysphoric  Affect:  Congruent  Thought Process:  Coherent and Goal Directed  Orientation:  Full (Time, Place, and Person)  Thought Content:  Logical  Suicidal Thoughts:  No  Homicidal Thoughts:  No  Memory:  Immediate;   Good Recent;   Good Remote;   Good  Judgement:  Good  Insight:  Good  Psychomotor Activity:  Normal  Concentration:  Concentration: Fair and Attention Span: Good  Recall:  Good  Fund of Knowledge:  Good  Language:  Good  Akathisia:  Negative  Handed:  Right  AIMS (if indicated):     Assets:  Communication Skills Desire for Improvement  ADL's:  Intact  Cognition:  WNL  Sleep:  Number of Hours: 7.75     Mental Status Per Nursing Assessment::   On Admission:  Suicidal ideation indicated by patient  Demographic Factors:  NA  Loss Factors: Decrease in vocational status  Historical Factors: NA  Risk Reduction Factors:   Sense of responsibility to family, Employed, Living with another person, especially a relative, Positive social support and Positive therapeutic relationship  Continued Clinical Symptoms:  Dysthymia  Cognitive Features That Contribute To Risk:  None    Suicide Risk:  Minimal: No identifiable  suicidal ideation.  Patients presenting with no risk factors but with morbid ruminations; may be classified as minimal risk based on the severity of the depressive symptoms  Follow-up [place] at [facility] Follow up.   Why: You are scheduled for a phone visit on Monday, December 14th at 8am. Thank you. Contact information:  [address] [place],  [place], [place] [address] Ph: [phone] Fax:          Plan Of Care/Follow-up recommendations:  Activity:  full- but no work x 3 weeks  Johnn Hai, MD 03/12/2019, 9:51 AM

## 2019-03-12 NOTE — Discharge Summary (Signed)
Physician Discharge Summary Note  Patient:  Kathleen Key is an 53 y.o., female MRN:  [id number] DOB:  1965/12/23 Patient phone:  [phone] (home)  Patient address:   [place] [address],  Total Time spent with patient: 45 minutes  Date of Admission:  03/05/2019 Date of Discharge: 03/12/2019  Reason for Admission:   History of Present Illness: Patient is seen and examined. Patient is a 53 year old female with a past psychiatric history significant for depression and attention deficit disorder who presented as a walk-in assessment to the behavioral health hospital on 03/05/2019. The patient was brought in by her husband. The patient during the interview today is essentially mute. Review of the electronic medical record stated that she voiced to the is initial assessment that she "lives in an alternate world". The husband stated she had always been forgetful, but since the Tuesday prior to admission she appeared to be disoriented and would not eat. She was only sleeping a few hours a night. She had reportedly started a new job that was stressful. During the interview today she is essentially mute. Her eye contact is minimal. She is psychomotor agitated to a degree with fidgeting. It does appear that she may be responding to internal stimuli at this time. Review of the electronic medical record revealed that she has been followed by Dr Pucilowskifor attention deficit disorder, generalized anxiety disorder and major depression. It appears that his first visit with her was approximately January of this year. She had a previous history of depression and had been treated with fluoxetine as well as been a plaque seen in the past. She had been on sertraline since sometime in 2019. She does have a history of suicidal thoughts at that time, but had never attempted suicide. In that note there was no evidence that she had a history of mania, psychosis, substance abuse issues. She was  started on stimulants at that time. She was being treated with sertraline 200 mg p.o. daily, and that was continued. Unfortunately we have no laboratory data on her whatsoever. She was admitted to the hospital for evaluation and stabilization. Her blood pressure is elevated at 154/82. She is afebrile. Her weight today is 58.1514 kg. Her BMI is 22.49. She only slept 4.25 hours last night. Principal Problem: <principal problem not specified> Discharge Diagnoses: Active Problems:   Severe recurrent major depression with psychotic features [facility])   Past Psychiatric History: past MDD  Past Medical History:  Past Medical History:  Diagnosis Date  . Asthma   . Fibroids    uterine  . IBS (irritable bowel syndrome)   . Pulmonary sarcoidosis ([place])   . Shortness of breath dyspnea     Past Surgical History:  Procedure Laterality Date  . ABDOMINAL HYSTERECTOMY N/A 10/13/2012   Procedure: HYSTERECTOMY ABDOMINAL;  Surgeon: Jonnie Kind, MD;  Location: [facility];  Service: Gynecology;  Laterality: N/A;  . BILATERAL SALPINGECTOMY Bilateral 10/13/2012   Procedure: BILATERAL SALPINGECTOMY;  Surgeon: Jonnie Kind, MD;  Location: [facility];  Service: Gynecology;  Laterality: Bilateral;  . BREAST BIOPSY Left 05/2015  . BREAST EXCISIONAL BIOPSY Left 05/2015  . BREAST LUMPECTOMY WITH RADIOACTIVE SEED LOCALIZATION Left 07/28/2015   Procedure: LEFT BREAST LUMPECTOMY WITH RADIOACTIVE SEED LOCALIZATION;  Surgeon: Autumn Messing III, MD;  Location: [facility];  Service: General;  Laterality: Left;  . WISDOM TOOTH EXTRACTION     Dr. Georgina Snell office-[facility], [place], [place]   Family History:  Family History  Problem Relation  Age of Onset  . Heart disease Mother        leaky valve  . Asthma Mother   . Hypertension Father        pacemaker  . Stroke Father   . Heart disease Father        pacemaker  . Fibromyalgia Sister   . Neuropathy Sister    Family Psychiatric  History: nonew  data Social History:  Social History   Substance and Sexual Activity  Alcohol Use Yes   Comment: occassional     Social History   Substance and Sexual Activity  Drug Use No    Social History   Socioeconomic History  . Marital status: Married    Spouse name: Not on file  . Number of children: Not on file  . Years of education: Not on file  . Highest education level: Not on file  Occupational History  . Not on file  Tobacco Use  . Smoking status: Never Smoker  . Smokeless tobacco: Never Used  Substance and Sexual Activity  . Alcohol use: Yes    Comment: occassional  . Drug use: No  . Sexual activity: Yes    Birth control/protection: Surgical  Other Topics Concern  . Not on file  Social History Narrative  . Not on file   Social Determinants of Health   Financial Resource Strain:   . Difficulty of Paying Living Expenses: Not on file  Food Insecurity:   . Worried About Charity fundraiser in the Last Year: Not on file  . Ran Out of Food in the Last Year: Not on file  Transportation Needs:   . Lack of Transportation (Medical): Not on file  . Lack of Transportation (Non-Medical): Not on file  Physical Activity:   . Days of Exercise per Week: Not on file  . Minutes of Exercise per Session: Not on file  Stress:   . Feeling of Stress : Not on file  Social Connections:   . Frequency of Communication with Friends and Family: Not on file  . Frequency of Social Gatherings with Friends and Family: Not on file  . Attends Religious Services: Not on file  . Active Member of Clubs or Organizations: Not on file  . Attends Archivist Meetings: Not on file  . Marital Status: Not on file    Hospital Course:    As discussed it was clear that Ms. Digiandomenico was suffering from a depression with psychotic features involving catatonic features at some point, she had deteriorated fairly rapidly with recent stressors, new job so forth.  Recent loss was also discussed.  At any  rate we held off on stimulants and use modafinil instead to treat the catatonic component of her presentation we use lorazepam as well and escalated her [place] therapy.  She gradually improved she generally stayed to herself and at the point of discharge I do not think she is 100% but she is not dangerous  At this point in time she is alert and oriented and cooperative her affect is generally constricted and she has a dysphoric but not depressed mood and she does not have thoughts of harming self or others she does not have any involuntary movements.  I spoke with her husband while she was in the hospital couple of times and we feel it is probably best to go ahead and discharge her now let her go home for the weekend and certainly stay out of work for the duration of the  month and into the holidays so forth and probably start early January if she is doing well meds are listed below.  Physical Findings: AIMS: Facial and Oral Movements Muscles of Facial Expression: None, normal Lips and Perioral Area: None, normal Jaw: None, normal Tongue: None, normal,Extremity Movements Upper (arms, wrists, hands, fingers): None, normal Lower (legs, knees, ankles, toes): None, normal, Trunk Movements Neck, shoulders, hips: None, normal, Overall Severity Severity of abnormal movements (highest score from questions above): None, normal Incapacitation due to abnormal movements: None, normal Patient's awareness of abnormal movements (rate only patient's report): No Awareness, Dental Status Current problems with teeth and/or dentures?: No Does patient usually wear dentures?: No  CIWA:  CIWA-Ar Total: 0 COWS:  COWS Total Score: 1  Musculoskeletal: Strength & Muscle Tone: within normal limits Gait & Station: normal Patient leans: N/A  Psychiatric Specialty Exam: Physical Exam  Review of Systems  Blood pressure 116/86, pulse (!) 110, temperature 97.6 F (36.4 C), temperature source Oral, resp. rate 18, height 5'  3.5" (1.613 m), weight 58.5 kg, last menstrual period 10/08/2012, SpO2 100 %.Body mass index is 22.49 kg/m.  General Appearance: Casual  Eye Contact:  Good  Speech:  Clear and Coherent  Volume:  Decreased  Mood:  Dysphoric  Affect:  Congruent  Thought Process:  Coherent and Goal Directed  Orientation:  Full (Time, Place, and Person)  Thought Content:  Logical  Suicidal Thoughts:  No  Homicidal Thoughts:  No  Memory:  Immediate;   Good Recent;   Good Remote;   Good  Judgement:  Good  Insight:  Good  Psychomotor Activity:  Normal  Concentration:  Concentration: Fair and Attention Span: Good  Recall:  Good  Fund of Knowledge:  Good  Language:  Good  Akathisia:  Negative  Handed:  Right  AIMS (if indicated):     Assets:  Communication Skills Desire for Improvement  ADL's:  Intact  Cognition:  WNL  Sleep:  Number of Hours: 7.75     Have you used any form of tobacco in the last 30 days? (Cigarettes, Smokeless Tobacco, Cigars, and/or Pipes): No  Has this patient used any form of tobacco in the last 30 days? (Cigarettes, Smokeless Tobacco, Cigars, and/or Pipes) Yes, No  Blood Alcohol level:  No results found for: [facility]  Metabolic Disorder Labs:  Lab Results  Component Value Date   HGBA1C 5.6 03/07/2019   MPG 114.02 03/07/2019   No results found for: PROLACTIN Lab Results  Component Value Date   CHOL 194 03/07/2019   TRIG 78 03/07/2019   HDL 74 03/07/2019   CHOLHDL 2.6 03/07/2019   VLDL 16 03/07/2019   LDLCALC 104 (H) 03/07/2019    See Psychiatric Specialty Exam and Suicide Risk Assessment completed by Attending Physician prior to discharge.  Discharge destination:  Home  Is patient on multiple antipsychotic therapies at discharge:  No   Has Patient had three or more failed trials of antipsychotic monotherapy by history:  No  Recommended Plan for Multiple Antipsychotic Therapies: NA   Allergies as of 03/12/2019      Reactions   Dilaudid [hydromorphone Hcl]  Itching   Percocet [oxycodone-acetaminophen] Itching   Excedrin Back & [acetaminophen-aspirin Buffered] Other (See Comments)   SUGAR-SPIKE, NERVOUSNESS      Medication List    STOP taking these medications   amphetamine-dextroamphetamine 20 MG 24 hr capsule Commonly known as: ADDERALL XR   sertraline 100 MG tablet Commonly known as: ZOLOFT     TAKE these medications  Indication  albuterol 108 (90 Base) MCG/ACT inhaler Commonly known as: VENTOLIN HFA Inhale 2 puffs every 6 hours if needed for breathing  Indication: Asthma   EnLyte Caps Take 1 capsule by mouth daily at 12 noon. May substitute Enbrace HR if covered  May get from [email or web address] if neither are  covered  Indication: Deficiency of Folic Acid   estradiol 1 MG tablet Commonly known as: ESTRACE Take 1 mg by mouth daily.  Indication: Postmenopausal Osteoporosis   LORazepam 0.5 MG tablet Commonly known as: ATIVAN Take 1 tablet (0.5 mg total) by mouth 3 (three) times daily for 10 days. What changed:   when to take this  reasons to take this  Indication: Feeling Anxious   polyethylene glycol 17 g packet Commonly known as: MIRALAX / GLYCOLAX Take 17 g by mouth 3 (three) times daily as needed.  Indication: Constipation   risperiDONE 2 MG tablet Commonly known as: RISPERDAL Take 1 tablet (2 mg total) by mouth at bedtime.  Indication: Major Depressive Disorder   traZODone 50 MG tablet Commonly known as: DESYREL Take 1 tablet (50 mg total) by mouth at bedtime as needed for sleep.  Indication: [place] at [facility] Follow up.   Why: You are scheduled for a phone visit on Monday, December 14th at 8am. Thank you. Contact information:  [address] Renae Fickle  [place], [place] [address] Ph: [phone] Fax:          SignedJohnn Hai, MD 03/12/2019, 9:48 AM

## 2019-03-12 NOTE — Tx Team (Signed)
Interdisciplinary Treatment and Diagnostic Plan Update  03/12/2019 Time of Session: 10:00am Kathleen Key MRN: [id number]  Principal Diagnosis: <principal problem not specified>  Secondary Diagnoses: Active Problems:   Severe recurrent major depression with psychotic features ([place])   Current Medications:  Current Facility-Administered Medications  Medication Dose Route Frequency Provider Last Rate Last Admin  . albuterol (VENTOLIN HFA) 108 (90 Base) MCG/ACT inhaler 1-2 puff  1-2 puff Inhalation Q6H PRN Sharma Covert, MD      . alum & mag hydroxide-simeth (MAALOX/MYLANTA) 200-200-20 MG/5ML suspension 30 mL  30 mL Oral Q4H PRN Lindon Romp A, NP      . feeding supplement (ENSURE ENLIVE) (ENSURE ENLIVE) liquid 237 mL  237 mL Oral BID BM Sharma Covert, MD   237 mL at 03/11/19 1801  . haloperidol lactate (HALDOL) injection 5 mg  5 mg Intramuscular BID Sharma Covert, MD   5 mg at 03/12/19 [id number]  . hydrOXYzine (ATARAX/VISTARIL) tablet 25 mg  25 mg Oral TID PRN Rozetta Nunnery, NP   25 mg at 03/08/19 2007  . LORazepam (ATIVAN) tablet 0.5 mg  0.5 mg Oral TID Johnn Hai, MD   0.5 mg at 03/12/19 0740  . LORazepam (ATIVAN) tablet 1 mg  1 mg Oral Q8H PRN Sharma Covert, MD      . magnesium hydroxide (MILK OF MAGNESIA) suspension 30 mL  30 mL Oral Daily PRN Lindon Romp A, NP      . modafinil (PROVIGIL) tablet 100 mg  100 mg Oral Daily Johnn Hai, MD   100 mg at 03/12/19 0740  . multivitamin with minerals tablet 1 tablet  1 tablet Oral Daily Hampton Abbot, MD   1 tablet at 03/12/19 0740  . risperiDONE (RISPERDAL) tablet 1 mg  1 mg Oral Daily Johnn Hai, MD   1 mg at 03/12/19 0740  . risperiDONE (RISPERDAL) tablet 2 mg  2 mg Oral QHS Johnn Hai, MD   2 mg at 03/11/19 2109  . sertraline (ZOLOFT) tablet 200 mg  200 mg Oral Daily Sharma Covert, MD   200 mg at 03/12/19 0740  . traZODone (DESYREL) tablet 50 mg  50 mg Oral QHS PRN Rozetta Nunnery, NP   50 mg at 03/06/19 2127    PTA Medications: Medications Prior to Admission  Medication Sig Dispense Refill Last Dose  . albuterol (VENTOLIN HFA) 108 (90 Base) MCG/ACT inhaler Inhale 2 puffs every 6 hours if needed for breathing 18 g 12 Past Month at Unknown time  . amphetamine-dextroamphetamine (ADDERALL XR) 20 MG 24 hr capsule Take 2 capsules (40 mg total) by mouth daily. 60 capsule 0 Past Month at Unknown time  . estradiol (ESTRACE) 1 MG tablet Take 1 mg by mouth daily.   Past Month at Unknown time  . LORazepam (ATIVAN) 0.5 MG tablet Take 1 tablet (0.5 mg total) by mouth 2 (two) times daily as needed for anxiety. 60 tablet 2 Past Month at Unknown time  . polyethylene glycol (MIRALAX / GLYCOLAX) packet Take 17 g by mouth 3 (three) times daily as needed. 14 each 0 Past Month at Unknown time  . sertraline (ZOLOFT) 100 MG tablet Take 2 tablets (200 mg total) by mouth daily. 60 tablet 0 Past Month at Unknown time    Patient Stressors: Health problems Medication change or noncompliance  Patient Strengths: Technical sales engineer for treatment/growth  Treatment Modalities: Medication Management, Group therapy, Case management,  1 to 1 session with clinician, Psychoeducation, Recreational  therapy.   Physician Treatment Plan for Primary Diagnosis: <principal problem not specified> Long Term Goal(s): Improvement in symptoms so as ready for discharge Improvement in symptoms so as ready for discharge   Short Term Goals: Ability to identify changes in lifestyle to reduce recurrence of condition will improve Ability to verbalize feelings will improve Ability to disclose and discuss suicidal ideas Ability to demonstrate self-control will improve Ability to identify and develop effective coping behaviors will improve Ability to maintain clinical measurements within normal limits will improve Ability to identify changes in lifestyle to reduce recurrence of condition will improve Ability to verbalize feelings  will improve Ability to disclose and discuss suicidal ideas Ability to demonstrate self-control will improve Ability to identify and develop effective coping behaviors will improve Ability to maintain clinical measurements within normal limits will improve  Medication Management: Evaluate patient's response, side effects, and tolerance of medication regimen.  Therapeutic Interventions: 1 to 1 sessions, Unit Group sessions and Medication administration.  Evaluation of Outcomes: Adequate for Discharge  Physician Treatment Plan for Secondary Diagnosis: Active Problems:   Severe recurrent major depression with psychotic features ([place])  Long Term Goal(s): Improvement in symptoms so as ready for discharge Improvement in symptoms so as ready for discharge   Short Term Goals: Ability to identify changes in lifestyle to reduce recurrence of condition will improve Ability to verbalize feelings will improve Ability to disclose and discuss suicidal ideas Ability to demonstrate self-control will improve Ability to identify and develop effective coping behaviors will improve Ability to maintain clinical measurements within normal limits will improve Ability to identify changes in lifestyle to reduce recurrence of condition will improve Ability to verbalize feelings will improve Ability to disclose and discuss suicidal ideas Ability to demonstrate self-control will improve Ability to identify and develop effective coping behaviors will improve Ability to maintain clinical measurements within normal limits will improve     Medication Management: Evaluate patient's response, side effects, and tolerance of medication regimen.  Therapeutic Interventions: 1 to 1 sessions, Unit Group sessions and Medication administration.  Evaluation of Outcomes: Adequate for Discharge   RN Treatment Plan for Primary Diagnosis: <principal problem not specified> Long Term Goal(s): Knowledge of disease and  therapeutic regimen to maintain health will improve  Short Term Goals: Ability to participate in decision making will improve, Ability to verbalize feelings will improve, Ability to identify and develop effective coping behaviors will improve and Compliance with prescribed medications will improve  Medication Management: RN will administer medications as ordered by provider, will assess and evaluate patient's response and provide education to patient for prescribed medication. RN will report any adverse and/or side effects to prescribing provider.  Therapeutic Interventions: 1 on 1 counseling sessions, Psychoeducation, Medication administration, Evaluate responses to treatment, Monitor vital signs and CBGs as ordered, Perform/monitor CIWA, COWS, AIMS and Fall Risk screenings as ordered, Perform wound care treatments as ordered.  Evaluation of Outcomes: Adequate for Discharge   LCSW Treatment Plan for Primary Diagnosis: <principal problem not specified> Long Term Goal(s): Safe transition to appropriate next level of care at discharge, Engage patient in therapeutic group addressing interpersonal concerns.  Short Term Goals: Engage patient in aftercare planning with referrals and resources, Increase social support, Increase ability to appropriately verbalize feelings, Identify triggers associated with mental health/substance abuse issues and Increase skills for wellness and recovery  Therapeutic Interventions: Assess for all discharge needs, 1 to 1 time with Social worker, Explore available resources and support systems, Assess for adequacy in community support network,  Educate family and significant other(s) on suicide prevention, Complete Psychosocial Assessment, Interpersonal group therapy.  Evaluation of Outcomes: Adequate for Discharge   Progress in Treatment: Attending groups: Yes. Participating in groups: Yes. Taking medication as prescribed: Yes. Toleration medication:  Yes. Family/Significant other contact made: Yes, individual(s) contacted:  husband, Aaron Edelman. Patient understands diagnosis: Yes. Discussing patient identified problems/goals with staff: No. Medical problems stabilized or resolved: No. Denies suicidal/homicidal ideation: Yes. Issues/concerns per patient self-inventory: Yes.  New problem(s) identified: Yes, Describe:  financial stressors  New Short Term/Long Term Goal(s): medication management for mood stabilization; elimination of SI thoughts; development of comprehensive mental wellness/sobriety plan.  Patient Goals:  Wants to get better, more confidence.  Discharge Plan or Barriers: Returning home with spouse, follow up with Dr.Pucilowski on 12/14.  Reason for Continuation of Hospitalization: Anxiety  Estimated Length of Stay: discharging today  Attendees: Patient: Kathleen Key 03/12/2019 9:53 AM  Physician: Dr.Farah 03/12/2019 9:53 AM  Nursing: Lillia Mountain, RN 03/12/2019 9:53 AM  RN Care Manager: 03/12/2019 9:53 AM  Social Worker: Stephanie Acre, [place] 03/12/2019 9:53 AM  Recreational Therapist:  03/12/2019 9:53 AM  Other:  03/12/2019 9:53 AM  Other:  03/12/2019 9:53 AM  Other: 03/12/2019 9:53 AM    Scribe for Treatment Team: Joellen Jersey, [place] 03/12/2019 9:53 AM

## 2019-03-12 NOTE — Plan of Care (Signed)
Pt attended one recreation therapy group session.   Victorino Sparrow, LRT/CTRS

## 2019-03-12 NOTE — Progress Notes (Signed)
Recreation Therapy Notes  INPATIENT RECREATION TR PLAN  Patient Details Name: [name] MRN: [id number] DOB: 07/04/1965 Today's Date: 03/12/2019  Rec Therapy Plan Is patient appropriate for Therapeutic Recreation?: Yes Treatment times per week: about 3 days Estimated Length of Stay: 5-7 days TR Treatment/Interventions: Group participation (Comment)  Discharge Criteria Pt will be discharged from therapy if:: Discharged Treatment plan/goals/alternatives discussed and agreed upon by:: Patient/family  Discharge Summary Short term goals set: See patient care plan Short term goals met: Not met Progress toward goals comments: Groups attended Which groups?: Other (Comment)(Team building) Reason goals not met: Pt attended one group session. Therapeutic equipment acquired: N/A Reason patient discharged from therapy: Discharge from hospital Pt/family agrees with progress & goals achieved: Yes Date patient discharged from therapy: 03/12/19    Victorino Sparrow, LRT/CTRS  Ria Comment, Almer Bushey A 03/12/2019, 11:58 AM

## 2019-03-15 ENCOUNTER — Other Ambulatory Visit: Payer: Self-pay

## 2019-03-15 ENCOUNTER — Ambulatory Visit (INDEPENDENT_AMBULATORY_CARE_PROVIDER_SITE_OTHER): Payer: 59 | Admitting: Psychiatry

## 2019-03-15 DIAGNOSIS — F333 Major depressive disorder, recurrent, severe with psychotic symptoms: Secondary | ICD-10-CM

## 2019-03-15 DIAGNOSIS — F411 Generalized anxiety disorder: Secondary | ICD-10-CM | POA: Diagnosis not present

## 2019-03-15 DIAGNOSIS — F988 Other specified behavioral and emotional disorders with onset usually occurring in childhood and adolescence: Secondary | ICD-10-CM

## 2019-03-15 NOTE — Progress Notes (Signed)
[place] MD/PA/NP OP Progress Note  03/15/2019 8:23 AM Kathleen Key  MRN:  [id number] Interview was conducted by phone and I verified that I was speaking with the correct person using two identifiers. I discussed the limitations of evaluation and management by telemedicine and  the availability of in person appointments. Patient expressed understanding and agreed to proceed.  Chief Complaint: Forgetfulness, fatigue.  HPI: 53yo married female with MDD/GAD and ADD.She was doing well on sertraline 200 mg and did not need lorazepam for anxiety often. Kathleen Key was also taking Adderall XR (last dose 40 mg) for ADD. She started a new job which she found stressful. Within 4 days she became withdrawn, not sleeping well, started to hallucinate. She was brought to [facility] by her husband on 14.4.20 and was admitted with dx of MDD severe. Adderall and Zoloft were held while risperidone 2 mg was added. She improved to the extent that she was ready for dc home on 12.11.20. Since dc she continue to feel "spacy", forgetful, has problems with focusing, task completion but subjectively feels that her mentation is becoming clearer. She denies having hallucinations and her sleep has improved. She is on FMLA till the end of the month.  Visit Diagnosis:    ICD-10-CM   1. GAD (generalized anxiety disorder)  F41.1   2. ADD (attention deficit disorder) without hyperactivity  F98.8   3. Severe recurrent major depression with psychotic features ([place])  F33.3     Past Psychiatric History: Please see intake H&P.  Past Medical History:  Past Medical History:  Diagnosis Date  . Asthma   . Fibroids    uterine  . IBS (irritable bowel syndrome)   . Pulmonary sarcoidosis ([place])   . Shortness of breath dyspnea     Past Surgical History:  Procedure Laterality Date  . ABDOMINAL HYSTERECTOMY N/A 10/13/2012   Procedure: HYSTERECTOMY ABDOMINAL;  Surgeon: Jonnie Kind, MD;  Location: [facility];  Service: Gynecology;  Laterality: N/A;  .  BILATERAL SALPINGECTOMY Bilateral 10/13/2012   Procedure: BILATERAL SALPINGECTOMY;  Surgeon: Jonnie Kind, MD;  Location: [facility];  Service: Gynecology;  Laterality: Bilateral;  . BREAST BIOPSY Left 05/2015  . BREAST EXCISIONAL BIOPSY Left 05/2015  . BREAST LUMPECTOMY WITH RADIOACTIVE SEED LOCALIZATION Left 07/28/2015   Procedure: LEFT BREAST LUMPECTOMY WITH RADIOACTIVE SEED LOCALIZATION;  Surgeon: Autumn Messing III, MD;  Location: [facility];  Service: General;  Laterality: Left;  . WISDOM TOOTH EXTRACTION     Dr. Georgina Snell office-[facility], [place], [place]    Family Psychiatric History: None.  Family History:  Family History  Problem Relation Age of Onset  . Heart disease Mother        leaky valve  . Asthma Mother   . Hypertension Father        pacemaker  . Stroke Father   . Heart disease Father        pacemaker  . Fibromyalgia Sister   . Neuropathy Sister     Social History:  Social History   Socioeconomic History  . Marital status: Married    Spouse name: Not on file  . Number of children: Not on file  . Years of education: Not on file  . Highest education level: Not on file  Occupational History  . Not on file  Tobacco Use  . Smoking status: Never Smoker  . Smokeless tobacco: Never Used  Substance and Sexual Activity  . Alcohol use: Yes    Comment: occassional  . Drug use:  No  . Sexual activity: Yes    Birth control/protection: Surgical  Other Topics Concern  . Not on file  Social History Narrative  . Not on file   Social Determinants of Health   Financial Resource Strain:   . Difficulty of Paying Living Expenses: Not on file  Food Insecurity:   . Worried About Charity fundraiser in the Last Year: Not on file  . Ran Out of Food in the Last Year: Not on file  Transportation Needs:   . Lack of Transportation (Medical): Not on file  . Lack of Transportation (Non-Medical): Not on file  Physical Activity:   . Days of Exercise per  Week: Not on file  . Minutes of Exercise per Session: Not on file  Stress:   . Feeling of Stress : Not on file  Social Connections:   . Frequency of Communication with Friends and Family: Not on file  . Frequency of Social Gatherings with Friends and Family: Not on file  . Attends Religious Services: Not on file  . Active Member of Clubs or Organizations: Not on file  . Attends Archivist Meetings: Not on file  . Marital Status: Not on file    Allergies:  Allergies  Allergen Reactions  . Dilaudid [Hydromorphone Hcl] Itching  . Percocet [Oxycodone-Acetaminophen] Itching  . Excedrin Back & [Acetaminophen-Aspirin Buffered] Other (See Comments)    SUGAR-SPIKE, NERVOUSNESS    Metabolic Disorder Labs: Lab Results  Component Value Date   HGBA1C 5.6 03/07/2019   MPG 114.02 03/07/2019   No results found for: PROLACTIN Lab Results  Component Value Date   CHOL 194 03/07/2019   TRIG 78 03/07/2019   HDL 74 03/07/2019   CHOLHDL 2.6 03/07/2019   VLDL 16 03/07/2019   LDLCALC 104 (H) 03/07/2019   Lab Results  Component Value Date   TSH 2.368 03/07/2019    Therapeutic Level Labs: No results found for: LITHIUM No results found for: VALPROATE No components found for:  CBMZ  Current Medications: Current Outpatient Medications  Medication Sig Dispense Refill  . albuterol (VENTOLIN HFA) 108 (90 Base) MCG/ACT inhaler Inhale 2 puffs every 6 hours if needed for breathing 18 g 12  . Dietary Management Product (ENLYTE) CAPS Take 1 capsule by mouth daily at 12 noon. May substitute Enbrace HR if covered  May get from [email or web address] if neither are  covered 30 capsule 11  . estradiol (ESTRACE) 1 MG tablet Take 1 mg by mouth daily.    [name] LORazepam (ATIVAN) 0.5 MG tablet Take 1 tablet (0.5 mg total) by mouth 3 (three) times daily for 10 days. 30 tablet 0  . polyethylene glycol (MIRALAX / GLYCOLAX) packet Take 17 g by mouth 3 (three) times daily as needed. 14 each 0  . risperiDONE  (RISPERDAL) 2 MG tablet Take 1 tablet (2 mg total) by mouth at bedtime. 30 tablet 1  . traZODone (DESYREL) 50 MG tablet Take 1 tablet (50 mg total) by mouth at bedtime as needed for sleep. 30 tablet 1   No current facility-administered medications for this visit.     Psychiatric Specialty Exam: Review of Systems  Constitutional: Positive for fatigue.  HENT: Positive for sneezing.   Psychiatric/Behavioral: Positive for confusion and sleep disturbance.  All other systems reviewed and are negative.   Last menstrual period 10/08/2012.There is no height or weight on file to calculate BMI.  General Appearance: NA  Eye Contact:  NA  Speech:  Clear and Coherent and Slow  Volume:  Normal  Mood:  Depressed  Affect:  NA  Thought Process:  Descriptions of Associations: Circumstantial  Orientation:  Full (Time, Place, and Person)  Thought Content: Logical   Suicidal Thoughts:  No  Homicidal Thoughts:  No  Memory:  Immediate;   Fair Recent;   Fair Remote;   Fair  Judgement:  Fair  Insight:  Fair  Psychomotor Activity:  NA  Concentration:  Concentration: Fair  Recall:  [employer] of Knowledge: Fair  Language: Good  Akathisia:  Negative  Handed:  Right  AIMS (if indicated): not done  Assets:  Communication Skills Desire for Improvement Financial Resources/Insurance Housing Social Support Talents/Skills  ADL's:  Intact  Cognition: WNL  Sleep:  Fair   Screenings: AIMS     Admission (Discharged) from OP Visit from 03/05/2019 in [facility]  AIMS Total Score  0    AUDIT     Admission (Discharged) from OP Visit from 03/05/2019 in [facility]  Alcohol Use Disorder Identification Test Final Score (AUDIT)  4       Assessment and Plan: 53yo married female with MDD/GAD and ADD.She was doing well on sertraline 200 mg and did not need lorazepam for anxiety often. Kathleen Key was also taking Adderall XR (last dose 40 mg)  for ADD. She started a new job which she found stressful. Within 4 days she became withdrawn, not sleeping well, started to hallucinate. She was brought to [facility] by her husband on 55.4.20 and was admitted with dx of MDD severe. Adderall and Zoloft were held while risperidone 2 mg was added. She improved to the extent that she was ready for dc home on 12.11.20. Since dc she continue to feel "spacy", forgetful, has problems with focusing, task completion but subjectively feels that her mentation is becoming clearer. She denies having hallucinations and her sleep has improved. She is on FMLA till the end of the month.  Dx: MDD recurrent moderate; ADD; GAD  Plan: Continue risperidone 2 mg at HS and trazodone 50 mg prn insomnia. Change lorazepam from 0.5 mg tid to tid prn anxiety. We will likely resume sertraline next week while continue to hold off on restarting Adderall. Next appointment in one week.The plan was discussed with patient and her husband who had an opportunity to ask questions and these were all answered. I spend25 minutes inphone consultation with the patient.    Stephanie Acre, MD 03/15/2019, 8:23 AM

## 2019-03-16 ENCOUNTER — Ambulatory Visit (INDEPENDENT_AMBULATORY_CARE_PROVIDER_SITE_OTHER): Payer: 59 | Admitting: Psychiatry

## 2019-03-16 ENCOUNTER — Other Ambulatory Visit: Payer: Self-pay

## 2019-03-16 DIAGNOSIS — F411 Generalized anxiety disorder: Secondary | ICD-10-CM

## 2019-03-16 DIAGNOSIS — F333 Major depressive disorder, recurrent, severe with psychotic symptoms: Secondary | ICD-10-CM | POA: Diagnosis not present

## 2019-03-16 DIAGNOSIS — F988 Other specified behavioral and emotional disorders with onset usually occurring in childhood and adolescence: Secondary | ICD-10-CM | POA: Diagnosis not present

## 2019-03-16 MED ORDER — ZOLPIDEM TARTRATE ER 6.25 MG PO TBCR: 6.2500 mg | EXTENDED_RELEASE_TABLET | Freq: Every day | ORAL | 0 refills | Status: DC

## 2019-03-16 NOTE — Progress Notes (Signed)
[place] MD/PA/NP OP Progress Note  03/16/2019 3:15 PM Kathleen Key  MRN:  [id number] Interview was conducted by phone and I verified that I was speaking with the correct person using two identifiers. I discussed the limitations of evaluation and management by telemedicine and  the availability of in person appointments. Patient expressed understanding and agreed to proceed.  Chief Complaint: "I cannot sleep".  HPI: 53yo married female with MDD/GAD and ADD.She was doing well on sertraline 200 mg and did not need lorazepam for anxiety often. Kathleen Key was also taking Adderall XR (last dose 40 mg) for ADD. She started a new job which she found stressful. Within 4 days she became withdrawn, not sleeping well, started to hallucinate. She was brought to [facility] by her husband on 31.4.20 and was admitted with dx of MDD severe. Adderall and Zoloft were held while risperidone 2 mg was added. She improved to the extent that she was ready for dc home on 12.11.20. Since dc she continue to feel "spacy", forgetful, has problems with focusing, task completion but subjectively feels that her mentation is becoming clearer. She denies having hallucinations and her sleep has initially improved. I spoke with her and her husband yesterday. She called ttoday insisting on having an early visit. She poceeded to tell me that she again cannot sleep - slept just 2 hours last niogh. I have larned from her sister who is now with her that [name] refused to take trazodone last night. When I tried to tell her that she needs to take it as well as rispridone she said she will not and that she needs a differnet "sleeping pill" because she has disturbing, vivid dreams when she takes trazodone. She is on FMLA till the end of the month.  Visit Diagnosis:    ICD-10-CM   1. GAD (generalized anxiety disorder)  F41.1   2. ADD (attention deficit disorder) without hyperactivity  F98.8   3. Severe recurrent major depression with psychotic features ([place] Key)   F33.3     Past Psychiatric History: Please see intake H&P.  Past Medical History:  Past Medical History:  Diagnosis Date  . Asthma   . Fibroids    uterine  . IBS (irritable bowel syndrome)   . Pulmonary sarcoidosis ([place])   . Shortness of breath dyspnea     Past Surgical History:  Procedure Laterality Date  . ABDOMINAL HYSTERECTOMY N/A 10/13/2012   Procedure: HYSTERECTOMY ABDOMINAL;  Surgeon: Jonnie Kind, MD;  Location: [facility];  Service: Gynecology;  Laterality: N/A;  . BILATERAL SALPINGECTOMY Bilateral 10/13/2012   Procedure: BILATERAL SALPINGECTOMY;  Surgeon: Jonnie Kind, MD;  Location: [facility];  Service: Gynecology;  Laterality: Bilateral;  . BREAST BIOPSY Left 05/2015  . BREAST EXCISIONAL BIOPSY Left 05/2015  . BREAST LUMPECTOMY WITH RADIOACTIVE SEED LOCALIZATION Left 07/28/2015   Procedure: LEFT BREAST LUMPECTOMY WITH RADIOACTIVE SEED LOCALIZATION;  Surgeon: Autumn Messing III, MD;  Location: [facility];  Service: General;  Laterality: Left;  . WISDOM TOOTH EXTRACTION     Dr. Georgina Snell office-[facility], [place], [place]    Family Psychiatric History: None.  Family History:  Family History  Problem Relation Age of Onset  . Heart disease Mother        leaky valve  . Asthma Mother   . Hypertension Father        pacemaker  . Stroke Father   . Heart disease Father        pacemaker  . Fibromyalgia Sister   .  Neuropathy Sister     Social History:  Social History   Socioeconomic History  . Marital status: Married    Spouse name: Not on file  . Number of children: Not on file  . Years of education: Not on file  . Highest education level: Not on file  Occupational History  . Not on file  Tobacco Use  . Smoking status: Never Smoker  . Smokeless tobacco: Never Used  Substance and Sexual Activity  . Alcohol use: Yes    Comment: occassional  . Drug use: No  . Sexual activity: Yes    Birth control/protection: Surgical  Other Topics Concern   . Not on file  Social History Narrative  . Not on file   Social Determinants of Health   Financial Resource Strain:   . Difficulty of Paying Living Expenses: Not on file  Food Insecurity:   . Worried About Charity fundraiser in the Last Year: Not on file  . Ran Out of Food in the Last Year: Not on file  Transportation Needs:   . Lack of Transportation (Medical): Not on file  . Lack of Transportation (Non-Medical): Not on file  Physical Activity:   . Days of Exercise per Week: Not on file  . Minutes of Exercise per Session: Not on file  Stress:   . Feeling of Stress : Not on file  Social Connections:   . Frequency of Communication with Friends and Family: Not on file  . Frequency of Social Gatherings with Friends and Family: Not on file  . Attends Religious Services: Not on file  . Active Member of Clubs or Organizations: Not on file  . Attends Archivist Meetings: Not on file  . Marital Status: Not on file    Allergies:  Allergies  Allergen Reactions  . Dilaudid [Hydromorphone Hcl] Itching  . Percocet [Oxycodone-Acetaminophen] Itching  . Excedrin Back & [Acetaminophen-Aspirin Buffered] Other (See Comments)    SUGAR-SPIKE, NERVOUSNESS    Metabolic Disorder Labs: Lab Results  Component Value Date   HGBA1C 5.6 03/07/2019   MPG 114.02 03/07/2019   No results found for: PROLACTIN Lab Results  Component Value Date   CHOL 194 03/07/2019   TRIG 78 03/07/2019   HDL 74 03/07/2019   CHOLHDL 2.6 03/07/2019   VLDL 16 03/07/2019   LDLCALC 104 (H) 03/07/2019   Lab Results  Component Value Date   TSH 2.368 03/07/2019    Therapeutic Level Labs: No results found for: LITHIUM No results found for: VALPROATE No components found for:  CBMZ  Current Medications: Current Outpatient Medications  Medication Sig Dispense Refill  . albuterol (VENTOLIN HFA) 108 (90 Base) MCG/ACT inhaler Inhale 2 puffs every 6 hours if needed for breathing 18 g 12  . Dietary  Management Product (ENLYTE) CAPS Take 1 capsule by mouth daily at 12 noon. May substitute Enbrace HR if covered  May get from [email or web address] if neither are  covered 30 capsule 11  . estradiol (ESTRACE) 1 MG tablet Take 1 mg by mouth daily.    [name] LORazepam (ATIVAN) 0.5 MG tablet Take 1 tablet (0.5 mg total) by mouth 3 (three) times daily for 10 days. 30 tablet 0  . polyethylene glycol (MIRALAX / GLYCOLAX) packet Take 17 g by mouth 3 (three) times daily as needed. 14 each 0  . risperiDONE (RISPERDAL) 2 MG tablet Take 1 tablet (2 mg total) by mouth at bedtime. 30 tablet 1  . zolpidem (AMBIEN CR) 6.25 MG CR tablet Take  1 tablet (6.25 mg total) by mouth at bedtime. 30 tablet 0   No current facility-administered medications for this visit.      Psychiatric Specialty Exam: Review of Systems  Psychiatric/Behavioral: Positive for confusion, dysphoric mood and sleep disturbance.  All other systems reviewed and are negative.   Last menstrual period 10/08/2012.There is no height or weight on file to calculate BMI.  General Appearance: NA  Eye Contact:  NA  Speech:  Clear and Coherent and Normal Rate  Volume:  Normal  Mood:  Anxious and Depressed  Affect:  NA  Thought Process:  Descriptions of Associations: Circumstantial  Orientation:  Full (Time, Place, and Person)  Thought Content: Tangential   Suicidal Thoughts:  No  Homicidal Thoughts:  No  Memory:  Immediate;   Fair Recent;   Fair Remote;   Fair  Judgement:  Fair  Insight:  Fair  Psychomotor Activity:  NA  Concentration:  Concentration: Poor  Recall:  [employer] of Knowledge: Fair  Language: Fair  Akathisia:  Negative  Handed:  Right  AIMS (if indicated): not done  Assets:  Desire for Improvement Financial Resources/Insurance Housing Resilience Social Support  ADL's:  Intact  Cognition: WNL  Sleep:  Poor   Screenings: AIMS     Admission (Discharged) from OP Visit from 03/05/2019 in [facility]  [facility]  AIMS Total Score  0    AUDIT     Admission (Discharged) from OP Visit from 03/05/2019 in [facility]  Alcohol Use Disorder Identification Test Final Score (AUDIT)  4       Assessment and Plan: 53yo married female with MDD/GAD and ADD.She was doing well on sertraline 200 mg and did not need lorazepam for anxiety often. Myia was also taking Adderall XR (last dose 40 mg) for ADD. She started a new job which she found stressful. Within 4 days she became withdrawn, not sleeping well, started to hallucinate. She was brought to [facility] by her husband on 71.4.20 and was admitted with dx of MDD severe. Adderall and Zoloft were held while risperidone 2 mg was added. She improved to the extent that she was ready for dc home on 12.11.20. Since dc she continue to feel "spacy", forgetful, has problems with focusing, task completion but subjectively feels that her mentation is becoming clearer. She denies having hallucinations and her sleep has initially improved. I spoke with her and her husband yesterday. She called ttoday insisting on having an early visit. She poceeded to tell me that she again cannot sleep - slept just 2 hours last niogh. I have larned from her sister who is now with her that Teofila refused to take trazodone last night. When I tried to tell her that she needs to take it as well as rispridone she said she will not and that she needs a differnet "sleeping pill" because she has disturbing, vivid dreams when she takes trazodone. She is on FMLA till the end of the month.  Dx: MDD recurrent moderate; ADD; GAD  Plan: Continue risperidone 2 mg at HS and add zolpidem CR 6.25 mg at HS instead of  trazodone 50 mg for insomnia. Lorazepam 0.5 mg tid to tid prn anxiety. We will likely resume sertraline next week while continue to hold off on restarting Adderall. Next appointment inone week.The plan was discussed with patient and her sister who had an opportunity to ask  questions and these were all answered. I spend25 minutes inphone consultation with  the patient.    Kathleen Acre, MD 03/16/2019, 3:15 PM

## 2019-03-18 ENCOUNTER — Other Ambulatory Visit: Payer: Self-pay

## 2019-03-18 ENCOUNTER — Telehealth (HOSPITAL_COMMUNITY): Payer: Self-pay | Admitting: *Deleted

## 2019-03-18 ENCOUNTER — Emergency Department (HOSPITAL_COMMUNITY)
Admission: EM | Admit: 2019-03-18 | Discharge: 2019-03-19 | Disposition: A | Discharge status: Home / Self Care | Payer: 59 | Attending: Emergency Medicine | Admitting: Emergency Medicine

## 2019-03-18 ENCOUNTER — Emergency Department (HOSPITAL_COMMUNITY): Payer: 59

## 2019-03-18 ENCOUNTER — Encounter (HOSPITAL_COMMUNITY): Payer: Self-pay | Admitting: Emergency Medicine

## 2019-03-18 DIAGNOSIS — N9489 Other specified conditions associated with female genital organs and menstrual cycle: Secondary | ICD-10-CM | POA: Diagnosis not present

## 2019-03-18 DIAGNOSIS — R1110 Vomiting, unspecified: Secondary | ICD-10-CM | POA: Diagnosis not present

## 2019-03-18 DIAGNOSIS — R112 Nausea with vomiting, unspecified: Secondary | ICD-10-CM

## 2019-03-18 DIAGNOSIS — J45909 Unspecified asthma, uncomplicated: Secondary | ICD-10-CM | POA: Diagnosis not present

## 2019-03-18 DIAGNOSIS — Z79899 Other long term (current) drug therapy: Secondary | ICD-10-CM | POA: Insufficient documentation

## 2019-03-18 DIAGNOSIS — R1909 Other intra-abdominal and pelvic swelling, mass and lump: Secondary | ICD-10-CM | POA: Diagnosis not present

## 2019-03-18 LAB — CBC
HCT: 44.4 % (ref 36.0–46.0)
Hemoglobin: 15.1 g/dL — ABNORMAL HIGH (ref 12.0–15.0)
MCH: 32.2 pg (ref 26.0–34.0)
MCHC: 34 g/dL (ref 30.0–36.0)
MCV: 94.7 fL (ref 80.0–100.0)
Platelets: 329 10*3/uL (ref 150–400)
RBC: 4.69 MIL/uL (ref 3.87–5.11)
RDW: 12.4 % (ref 11.5–15.5)
WBC: 5.4 10*3/uL (ref 4.0–10.5)
nRBC: 0 % (ref 0.0–0.2)

## 2019-03-18 LAB — COMPREHENSIVE METABOLIC PANEL
ALT: 19 U/L (ref 0–44)
AST: 23 U/L (ref 15–41)
Albumin: 4 g/dL (ref 3.5–5.0)
Alkaline Phosphatase: 56 U/L (ref 38–126)
Anion gap: 12 (ref 5–15)
BUN: 12 mg/dL (ref 6–20)
CO2: 22 mmol/L (ref 22–32)
Calcium: 9.6 mg/dL (ref 8.9–10.3)
Chloride: 101 mmol/L (ref 98–111)
Creatinine, Ser: 0.61 mg/dL (ref 0.44–1.00)
GFR calc Af Amer: >60 mL/min (ref >60)
GFR calc non Af Amer: >60 mL/min (ref >60)
Glucose, Bld: 102 mg/dL — ABNORMAL HIGH (ref 70–99)
Potassium: 4.2 mmol/L (ref 3.5–5.1)
Sodium: 135 mmol/L (ref 135–145)
Total Bilirubin: 0.2 mg/dL — ABNORMAL LOW (ref 0.3–1.2)
Total Protein: 7.2 g/dL (ref 6.5–8.1)

## 2019-03-18 LAB — LIPASE, BLOOD: Lipase: 24 U/L (ref 11–51)

## 2019-03-18 MED ORDER — IOHEXOL 300 MG/ML  SOLN
100.0000 mL | Freq: Once | INTRAMUSCULAR | Status: AC | PRN
  Administered 2019-03-18: 100 mL via INTRAVENOUS

## 2019-03-18 MED ORDER — LACTATED RINGERS IV BOLUS
1000.0000 mL | Freq: Once | INTRAVENOUS | Status: AC
  Administered 2019-03-18: 1000 mL via INTRAVENOUS

## 2019-03-18 MED ORDER — SODIUM CHLORIDE 0.9% FLUSH
3.0000 mL | Freq: Once | INTRAVENOUS | Status: AC
  Administered 2019-03-18: 3 mL via INTRAVENOUS

## 2019-03-18 MED ORDER — MORPHINE SULFATE (PF) 4 MG/ML IV SOLN: 4.0000 mg | Freq: Once | INTRAVENOUS | Status: DC

## 2019-03-18 MED ORDER — ONDANSETRON HCL 4 MG/2ML IJ SOLN
4.0000 mg | Freq: Once | INTRAMUSCULAR | Status: AC
  Administered 2019-03-18: 4 mg via INTRAVENOUS
  Filled 2019-03-18: qty 2

## 2019-03-18 NOTE — ED Provider Notes (Signed)
[facility] EMERGENCY DEPARTMENT Provider Note   CSN: [id number] Arrival date & time: 03/18/19  1731     History Chief Complaint  Patient presents with  . Abdominal Pain      HPI Kathleen Key is a 53 y.o. female with a medical history of asthma, fibroids who presents to the ED for abdominal pain and vomiting. She reports symptoms of four days duration, gradually worsening. He states he pain is diffuse and cramping but worse on the left side. Has history of hysterectomy, denies any vaginal bleeding or discharge. Denies dysuria, frequency or flank pain. She has h/o colonoscopy last year and states is was "clean". She denies any recent trauma, fall or injury and denies any recent suspicious foods. She had covid in October of this year as well and reports that she recovered from it.      Past Medical History:  Diagnosis Date  . Asthma   . Fibroids    uterine  . IBS (irritable bowel syndrome)   . Pulmonary sarcoidosis ([place])   . Shortness of breath dyspnea     Patient Active Problem List   Diagnosis Date Noted  . Severe recurrent major depression with psychotic features ([place]) 03/05/2019  . COVID-19 virus infection 01/17/2019  . Major depressive disorder, recurrent episode, in partial remission ([place]) 05/26/2018  . GAD (generalized anxiety disorder) 04/29/2018  . ADD (attention deficit disorder) without hyperactivity 04/29/2018  . Rash and nonspecific skin eruption 04/25/2017  . Asthma, cough variant 02/23/2014  . Routine gynecological examination 11/29/2013  . Sarcoid ([place]) 12/09/2012  . Post op infection 10/15/2012    Past Surgical History:  Procedure Laterality Date  . ABDOMINAL HYSTERECTOMY N/A 10/13/2012   Procedure: HYSTERECTOMY ABDOMINAL;  Surgeon: Jonnie Kind, MD;  Location: [facility];  Service: Gynecology;  Laterality: N/A;  . BILATERAL SALPINGECTOMY Bilateral 10/13/2012   Procedure: BILATERAL SALPINGECTOMY;  Surgeon: Jonnie Kind, MD;   Location: [facility];  Service: Gynecology;  Laterality: Bilateral;  . BREAST BIOPSY Left 05/2015  . BREAST EXCISIONAL BIOPSY Left 05/2015  . BREAST LUMPECTOMY WITH RADIOACTIVE SEED LOCALIZATION Left 07/28/2015   Procedure: LEFT BREAST LUMPECTOMY WITH RADIOACTIVE SEED LOCALIZATION;  Surgeon: Autumn Messing III, MD;  Location: [facility];  Service: General;  Laterality: Left;  . WISDOM TOOTH EXTRACTION     Dr. Georgina Snell office-[facility], [place], [place]     OB History   No obstetric history on file.     Family History  Problem Relation Age of Onset  . Heart disease Mother        leaky valve  . Asthma Mother   . Hypertension Father        pacemaker  . Stroke Father   . Heart disease Father        pacemaker  . Fibromyalgia Sister   . Neuropathy Sister     Social History   Tobacco Use  . Smoking status: Never Smoker  . Smokeless tobacco: Never Used  Substance Use Topics  . Alcohol use: Yes    Comment: occassional  . Drug use: No    Home Medications Prior to Admission medications   Medication Sig Start Date End Date Taking? Authorizing Provider  albuterol (VENTOLIN HFA) 108 (90 Base) MCG/ACT inhaler Inhale 2 puffs every 6 hours if needed for breathing 11/09/18   [name] D, MD  Dietary Management Product (ENLYTE) CAPS Take 1 capsule by mouth daily at 12 noon. May substitute Enbrace HR if covered  May  get from [email or web address] if neither are  covered 03/12/19   Johnn Hai, MD  estradiol (ESTRACE) 1 MG tablet Take 1 mg by mouth daily. 01/28/19   [provider]  LORazepam (ATIVAN) 0.5 MG tablet Take 1 tablet (0.5 mg total) by mouth 3 (three) times daily for 10 days. 03/12/19 03/22/19  Johnn Hai, MD  ondansetron (ZOFRAN ODT) 4 MG disintegrating tablet Take 1 tablet (4 mg total) by mouth every 8 (eight) hours as needed. 03/19/19   McDonald, Mia A, PA-C  polyethylene glycol (MIRALAX / GLYCOLAX) packet Take 17 g by mouth 3 (three) times daily as  needed. 10/17/12   Virgel Manifold, MD  promethazine (PHENERGAN) 25 MG suppository Place 1 suppository (25 mg total) rectally every 6 (six) hours as needed for nausea or vomiting. 03/19/19   McDonald, Mia A, PA-C  risperiDONE (RISPERDAL) 2 MG tablet Take 1 tablet (2 mg total) by mouth at bedtime. 03/12/19   Johnn Hai, MD  zolpidem (AMBIEN CR) 6.25 MG CR tablet Take 1 tablet (6.25 mg total) by mouth at bedtime. 03/16/19   Pucilowski, Marchia Bond, MD    Allergies    Dilaudid [hydromorphone hcl], Percocet [oxycodone-acetaminophen], and Excedrin back & [acetaminophen-aspirin buffered]  Review of Systems   Review of Systems  Constitutional: Negative for chills and fever.  HENT: Negative for ear pain and sore throat.   Eyes: Negative for pain and visual disturbance.  Respiratory: Negative for cough and shortness of breath.   Cardiovascular: Negative for chest pain and palpitations.  Gastrointestinal: Positive for abdominal pain, nausea and vomiting. Negative for blood in stool and diarrhea.  Genitourinary: Negative for dysuria and hematuria.  Musculoskeletal: Negative for arthralgias and back pain.  Skin: Negative for color change and rash.  Neurological: Negative for seizures and syncope.  All other systems reviewed and are negative.   Physical Exam Updated Vital Signs BP 119/66 (BP Location: Left Arm)   Pulse 65   Temp (!) 97.2 F (36.2 C) (Oral)   Resp 17   LMP 10/08/2012   SpO2 100%   Physical Exam Vitals and nursing note reviewed.  Constitutional:      General: She is not in acute distress.    Appearance: Normal appearance. She is well-developed. She is not ill-appearing.  HENT:     Head: Normocephalic and atraumatic.     Right Ear: External ear normal.     Left Ear: External ear normal.     Nose: Nose normal. No rhinorrhea.     Mouth/Throat:     Mouth: Mucous membranes are moist.  Eyes:     General:        Right eye: No discharge.        Left eye: No discharge.      Conjunctiva/sclera: Conjunctivae normal.  Cardiovascular:     Rate and Rhythm: Normal rate and regular rhythm.     Pulses: Normal pulses.     Heart sounds: Normal heart sounds. No murmur.  Pulmonary:     Effort: Pulmonary effort is normal. No respiratory distress.     Breath sounds: Normal breath sounds. No wheezing or rales.  Abdominal:     General: Abdomen is flat. There is no distension.     Palpations: Abdomen is soft.     Tenderness: There is abdominal tenderness.     Comments: Her abdomen is soft, non distended with moderate LUQ tenderness to palpaiton and guarding  Musculoskeletal:        General: No deformity or signs of  injury. Normal range of motion.     Cervical back: Normal range of motion and neck supple.  Skin:    General: Skin is warm and dry.     Capillary Refill: Capillary refill takes less than 2 seconds.     Coloration: Skin is not jaundiced.  Neurological:     General: No focal deficit present.     Mental Status: She is alert. Mental status is at baseline.  Psychiatric:        Mood and Affect: Mood normal.        Behavior: Behavior normal.     ED Results / Procedures / Treatments   Labs (all labs ordered are listed, but only abnormal results are displayed) Labs Reviewed  COMPREHENSIVE METABOLIC PANEL - Abnormal; Notable for the following components:      Result Value   Glucose, Bld 102 (*)    Total Bilirubin 0.2 (*)    All other components within normal limits  CBC - Abnormal; Notable for the following components:   Hemoglobin 15.1 (*)    All other components within normal limits  URINALYSIS, ROUTINE W REFLEX MICROSCOPIC - Abnormal; Notable for the following components:   APPearance HAZY (*)    Specific Gravity, Urine 1.039 (*)    Ketones, ur 5 (*)    All other components within normal limits  LIPASE, BLOOD    EKG None  Radiology CT ABDOMEN PELVIS W CONTRAST  Result Date: 03/18/2019 CLINICAL DATA:  Acute generalized abdominal pain. Nausea  and vomiting. EXAM: CT ABDOMEN AND PELVIS WITH CONTRAST TECHNIQUE: Multidetector CT imaging of the abdomen and pelvis was performed using the standard protocol following bolus administration of intravenous contrast. CONTRAST:  64mL OMNIPAQUE IOHEXOL 300 MG/ML  SOLN COMPARISON:  Abdomen pelvis CT 09/29/2012 FINDINGS: Lower chest: Small subpleural nodule in the left lower lobe unchanged from prior chest CT. No acute airspace disease. No pleural fluid. Heart is normal in size. Small pericardial effusion measures up to 13 mm in depth anteriorly. Hepatobiliary: Few tiny subcentimeter scattered hepatic hypodensities are too small to accurately characterize but likely small cyst or hemangioma. No suspicious hepatic lesion. No gallstones, gallbladder wall thickening, or biliary dilatation. Pancreas: No ductal dilatation or inflammation. Spleen: Normal in size without focal abnormality. Adrenals/Urinary Tract: Normal adrenal glands. No hydronephrosis. No perinephric edema. Slight bilateral heterogeneous renal enhancement which is nonspecific. There are small bilateral cortical hypodensities that are too small to accurately characterize. Ureters are decompressed. Symmetric renal excretion on delayed phase imaging. Urinary bladder is physiologically distended and unremarkable. Stomach/Bowel: Bowel evaluation is limited in the absence of enteric contrast and paucity of intra abdominal CT. Nondistended stomach. Small bowel is decompressed. No bowel obstruction. No evidence of bowel inflammation. Cecum is located in the central lower abdomen. Appendix tentatively identified and normal, series 6, image 52. Moderate volume of stool throughout the colon. Tortuosity of the transverse and descending colon. No colonic inflammation. Vascular/Lymphatic: Abdominal aorta is normal in caliber. Portal vein is patent. Mesenteric vessels are patent. No adenopathy. Reproductive: Post hysterectomy. 2.4 cm rounded fat density lesion in the right  adnexa with adjacent cyst, likely ovarian dermoid. Left ovary tentatively visualized and normal. Other: Minimal free fluid in the pelvis. No upper abdominal ascites. No free air or intra-abdominal abscess. Musculoskeletal: There are no acute or suspicious osseous abnormalities. IMPRESSION: 1. Slight heterogeneous renal enhancement, recommend correlation with urinalysis to exclude urinary tract infection. No other acute findings in the abdomen/pelvis. 2. Small pericardial effusion measures up to 13 mm  in depth anteriorly. 3. Fat density lesion in the right adnexa measuring 2.4 cm is likely an ovarian dermoid. Recommend nonemergent pelvic ultrasound for characterization. Electronically Signed   By: Keith Rake M.D.   On: 03/18/2019 23:30    Procedures Procedures (including critical care time)  Medications Ordered in ED Medications  sodium chloride flush (NS) 0.9 % injection 3 mL (3 mLs Intravenous Given 03/18/19 2245)  lactated ringers bolus 1,000 mL (0 mLs Intravenous Stopped 03/19/19 0225)  ondansetron (ZOFRAN) injection 4 mg (4 mg Intravenous Given 03/18/19 2233)  iohexol (OMNIPAQUE) 300 MG/ML solution 100 mL (100 mLs Intravenous Contrast Given 03/18/19 2244)    ED Course  I have reviewed the triage vital signs and the nursing notes.  Pertinent labs & imaging results that were available during my care of the patient were reviewed by me and considered in my medical decision making (see chart for details).    MDM Rules/Calculators/A&P                      Kathleen Key is a 53 y.o. female with a medical history of asthma, fibroids, hysterectomy.  Who presents to the ED for abdominal pain and vomiting, ongoing for 4 days.  Abdominal pain is cramping, left-sided, not associated with change in bowel movement or urinary symptoms.  No vaginal bleeding or discharge. Colonoscopy last year was "clean". She had covid in October of this year as well and reports that she recovered from it. Her  abdomen is soft, non distended with moderate LUQ tenderness to palpaiton and guarding  HPI and physical exam as above. She presents awake, alert, hemodynamically stable, afebrile, non toxic. Her abdomen is soft, non distended with moderate LUQ tenderness to palpaiton and guarding. Her laboratory work is reassuring, no leukocytosis or anemia. Low suspicion for pancreatitis, obstruction, perforation. Plan to obtain ct abdomen and reassess after pain medicine. At time of signout pending ct abdomen and urine analysis. Plan was discussed with provider at signout.   Final Clinical Impression(s) / ED Diagnoses Final diagnoses:  Non-intractable vomiting with nausea, unspecified vomiting type  Adnexal mass    Rx / DC Orders ED Discharge Orders         Ordered    promethazine (PHENERGAN) 25 MG suppository  Every 6 hours PRN,   Status:  Discontinued     03/19/19 0208    ondansetron (ZOFRAN ODT) 4 MG disintegrating tablet  Every 8 hours PRN,   Status:  Discontinued     03/19/19 0208    ondansetron (ZOFRAN ODT) 4 MG disintegrating tablet  Every 8 hours PRN     03/19/19 0216    promethazine (PHENERGAN) 25 MG suppository  Every 6 hours PRN     03/19/19 0216           Amee Boothe, Lovena Le, MD 03/19/19 Ward Chatters    Isla Pence, MD 03/19/19 1909

## 2019-03-18 NOTE — ED Triage Notes (Signed)
Pt c/o abdominal pain, nausea, and vomiting x 2 days. Denies fevers or diarrhea.

## 2019-03-18 NOTE — Telephone Encounter (Signed)
I agree - her sx are not likely to be related to current psychotropic meds and she should contact her PCP. No contraindication to taking something for sinuses particularly if it is not sedating.

## 2019-03-18 NOTE — ED Provider Notes (Signed)
53 year old female received at signout from Dr. Lovena Le Day, medical resident under the supervision of Dr. Gilford Raid pending UA. Per his HPI:  "Kathleen Key is a 53 y.o. female with a medical history of asthma, fibroids who presents to the ED for abdominal pain and vomiting. She reports symptoms of four days duration, gradually worsening. He states he pain is diffuse and cramping but worse on the left side. Has history of hysterectomy, denies any vaginal bleeding or discharge. Denies dysuria, frequency or flank pain. She has h/o colonoscopy last year and states is was "clean". She denies any recent trauma, fall or injury and denies any recent suspicious foods. She had covid in October of this year as well and reports that she recovered from it."    Physical Exam  BP 119/66 (BP Location: Left Arm)   Pulse 65   Temp (!) 97.2 F (36.2 C) (Oral)   Resp 17   LMP 10/08/2012   SpO2 100%   Physical Exam Vitals and nursing note reviewed.  Constitutional:      General: She is not in acute distress.    Comments: Thin female in NAD.   HENT:     Head: Normocephalic.  Eyes:     Conjunctiva/sclera: Conjunctivae normal.  Cardiovascular:     Rate and Rhythm: Normal rate and regular rhythm.     Heart sounds: No murmur. No friction rub. No gallop.   Pulmonary:     Effort: Pulmonary effort is normal. No respiratory distress.     Breath sounds: No stridor. No wheezing, rhonchi or rales.  Abdominal:     General: There is no distension.     Palpations: Abdomen is soft.  Musculoskeletal:     Cervical back: Neck supple.  Skin:    General: Skin is warm.     Findings: No rash.  Neurological:     Mental Status: She is alert.  Psychiatric:        Behavior: Behavior normal.     ED Course/Procedures     Procedures  MDM   53 year old female received a signout from Dr. Lovena Le Day, ER resident, under the supervision of Dr. Gilford Raid pending urinalysis results.  Please see his note for further work-up  and medical decision making.  Urinalysis is not concerning for infection.  The patient was successfully fluid challenge in the ER.  Reports that she is feeling much better.  Also discussed findings of fat density lesion in the right adnexa, and the patient was advised to follow-up with OB/GYN for nonemergent pelvic ultrasound.  She is agreeable with this plan at this time.  We will discharge the patient home with a short course of Phenergan suppositories and Zofran for nausea and vomiting.  She has been made aware that they should be taken on a short-term basis given QTC prolonging tendency for home medications in addition to antiemetics.  All questions answered.  Advised the patient to follow-up with primary care in addition to OB/GYN.        Joanne Gavel, PA-C 03/19/19 [id number]    Isla Pence, MD 03/19/19 Pauline Aus

## 2019-03-19 LAB — URINALYSIS, ROUTINE W REFLEX MICROSCOPIC
Bilirubin Urine: NEGATIVE
Glucose, UA: NEGATIVE mg/dL
Hgb urine dipstick: NEGATIVE
Ketones, ur: 5 mg/dL — AB
Leukocytes,Ua: NEGATIVE
Nitrite: NEGATIVE
Protein, ur: NEGATIVE mg/dL
Specific Gravity, Urine: 1.039 — ABNORMAL HIGH (ref 1.005–1.030)
pH: 7 (ref 5.0–8.0)

## 2019-03-19 MED ORDER — ONDANSETRON 4 MG PO TBDP: 4.0000 mg | ORAL_TABLET | Freq: Three times a day (TID) | ORAL | 0 refills | Status: DC | PRN

## 2019-03-19 MED ORDER — PROMETHAZINE HCL 25 MG RE SUPP: 25.0000 mg | Freq: Four times a day (QID) | RECTAL | 0 refills | Status: DC | PRN

## 2019-03-19 NOTE — Discharge Instructions (Addendum)
Thank you for allowing me to care for you today in the Emergency Department.   Let 1 tablet of Zofran dissolve in your tongue every 8 hours as needed.  If you continue to vomiting, you can place 1 Phenergan suppository rectally every 6 hours as needed for nausea or vomiting.  These medications are safe to use short-term along with your other home medications.  However, if you do develop chest pain or shortness of breath, you should return to have this evaluated.  Please schedule a follow-up appointment with your OB/GYN.  There was some concern that you may have a fat-containing cyst on your right ovary, which should be further evaluated with a pelvic ultrasound.  Return to the emergency department if symptoms worsen, including persistent vomiting despite taking Zofran or Phenergan, if you stop making urine, if you develop high fevers, severe abdominal pain, or other new, concerning symptoms.

## 2019-03-22 ENCOUNTER — Other Ambulatory Visit: Payer: Self-pay

## 2019-03-22 ENCOUNTER — Ambulatory Visit (INDEPENDENT_AMBULATORY_CARE_PROVIDER_SITE_OTHER): Payer: 59 | Admitting: Psychiatry

## 2019-03-22 DIAGNOSIS — F988 Other specified behavioral and emotional disorders with onset usually occurring in childhood and adolescence: Secondary | ICD-10-CM

## 2019-03-22 DIAGNOSIS — F331 Major depressive disorder, recurrent, moderate: Secondary | ICD-10-CM

## 2019-03-22 DIAGNOSIS — F411 Generalized anxiety disorder: Secondary | ICD-10-CM

## 2019-03-22 MED ORDER — LORAZEPAM 0.5 MG PO TABS: 0.5000 mg | ORAL_TABLET | Freq: Three times a day (TID) | ORAL | 0 refills | Status: AC | PRN

## 2019-03-22 MED ORDER — SERTRALINE HCL 100 MG PO TABS: 100.0000 mg | ORAL_TABLET | Freq: Every day | ORAL | 1 refills | Status: DC

## 2019-03-22 MED ORDER — LORAZEPAM 0.5 MG PO TABS: 0.5000 mg | ORAL_TABLET | Freq: Three times a day (TID) | ORAL | 0 refills | Status: DC | PRN

## 2019-03-22 NOTE — Progress Notes (Signed)
[name]  03/22/2019 8:46 AM Kathleen Key  MRN:  [id number] Interview was conducted by phone and I verified that I was speaking with the correct person using two identifiers. I discussed the limitations of evaluation and management by telemedicine and  the availability of in person appointments. Patient expressed understanding and agreed to proceed.  Chief Complaint: Insomnia, depression.  HPI:  53yomarriedfemale with MDD/GAD and ADD.She was doing well on sertraline 200 mg and did not need lorazepam for anxiety often. Kathleen Key was also taking Adderall XR (last dose 40 mg) for ADD. She started a new job which she found stressful. Within 4 days she became withdrawn, not sleeping well, started to hallucinate. She was brought to [facility] by her husband on 75.4.20 and was admitted with dx of MDD severe. Adderall and Zoloft were held while risperidone 2 mg was added. She improved to the extent that she was ready for dc home on 12.11.20. Since dc she continue to feel "spacy", forgetful, has problems with focusing, task completion but subjectively feels that her mentation is becoming clearer. She denies having hallucinations and her sleep has initially improved. I have larned from her sister who is now with her that Kathleen Key refused to take trazodone last night. When I tried to tell her that she needs to take it as well as rispridone she said she will not and that she needs a differnet "sleeping pill" because she has disturbing, vivid dreams when she takes trazodone. She reports the same problem with zolpidem. Sleep continues to vary - takes melatonin now. Mood is becoming more depressed. She is on FMLA till the end of the month.   Visit Diagnosis:    ICD-10-CM   1. GAD (generalized anxiety disorder)  F41.1   2. ADD (attention deficit disorder) without hyperactivity  F98.8   3. Major depressive disorder, recurrent episode, moderate (HCC)  F33.1     Past Psychiatric History: Please see  intake  H&P.  Past Medical History:  Past Medical History:  Diagnosis Date  . Asthma   . Fibroids    uterine  . IBS (irritable bowel syndrome)   . Pulmonary sarcoidosis ([place])   . Shortness of breath dyspnea     Past Surgical History:  Procedure Laterality Date  . ABDOMINAL HYSTERECTOMY N/A 10/13/2012   Procedure: HYSTERECTOMY ABDOMINAL;  Surgeon: Jonnie Kind, MD;  Location: [facility];  Service: Gynecology;  Laterality: N/A;  . BILATERAL SALPINGECTOMY Bilateral 10/13/2012   Procedure: BILATERAL SALPINGECTOMY;  Surgeon: Jonnie Kind, MD;  Location: [facility];  Service: Gynecology;  Laterality: Bilateral;  . BREAST BIOPSY Left 05/2015  . BREAST EXCISIONAL BIOPSY Left 05/2015  . BREAST LUMPECTOMY WITH RADIOACTIVE SEED LOCALIZATION Left 07/28/2015   Procedure: LEFT BREAST LUMPECTOMY WITH RADIOACTIVE SEED LOCALIZATION;  Surgeon: Autumn Messing III, MD;  Location: [place];  Service: General;  Laterality: Left;  . WISDOM TOOTH EXTRACTION     Dr. Georgina Snell office-[facility], [place], [place]    Family Psychiatric History: None.  Family History:  Family History  Problem Relation Age of Onset  . Heart disease Mother        leaky valve  . Asthma Mother   . Hypertension Father        pacemaker  . Stroke Father   . Heart disease Father        pacemaker  . Fibromyalgia Sister   . Neuropathy Sister     Social History:  Social History   Socioeconomic History  .  Marital status: Married    Spouse name: Not on file  . Number of children: Not on file  . Years of education: Not on file  . Highest education level: Not on file  Occupational History  . Not on file  Tobacco Use  . Smoking status: Never Smoker  . Smokeless tobacco: Never Used  Substance and Sexual Activity  . Alcohol use: Yes    Comment: occassional  . Drug use: No  . Sexual activity: Yes    Birth control/protection: Surgical  Other Topics Concern  . Not on file  Social History Narrative  .  Not on file   Social Determinants of Health   Financial Resource Strain:   . Difficulty of Paying Living Expenses: Not on file  Food Insecurity:   . Worried About Charity fundraiser in the Last Year: Not on file  . Ran Out of Food in the Last Year: Not on file  Transportation Needs:   . Lack of Transportation (Medical): Not on file  . Lack of Transportation (Non-Medical): Not on file  Physical Activity:   . Days of Exercise per Week: Not on file  . Minutes of Exercise per Session: Not on file  Stress:   . Feeling of Stress : Not on file  Social Connections:   . Frequency of Communication with Friends and Family: Not on file  . Frequency of Social Gatherings with Friends and Family: Not on file  . Attends Religious Services: Not on file  . Active Member of Clubs or Organizations: Not on file  . Attends Archivist Meetings: Not on file  . Marital Status: Not on file    Allergies:  Allergies  Allergen Reactions  . Dilaudid [Hydromorphone Hcl] Itching  . Percocet [Oxycodone-Acetaminophen] Itching  . Excedrin Back & [Acetaminophen-Aspirin Buffered] Other (See Comments)    SUGAR-SPIKE, NERVOUSNESS    Metabolic Disorder Labs: Lab Results  Component Value Date   HGBA1C 5.6 03/07/2019   MPG 114.02 03/07/2019   No results found for: PROLACTIN Lab Results  Component Value Date   CHOL 194 03/07/2019   TRIG 78 03/07/2019   HDL 74 03/07/2019   CHOLHDL 2.6 03/07/2019   VLDL 16 03/07/2019   LDLCALC 104 (H) 03/07/2019   Lab Results  Component Value Date   TSH 2.368 03/07/2019    Therapeutic Level Labs: No results found for: LITHIUM No results found for: VALPROATE No components found for:  CBMZ  Current Medications: Current Outpatient Medications  Medication Sig Dispense Refill  . albuterol (VENTOLIN HFA) 108 (90 Base) MCG/ACT inhaler Inhale 2 puffs every 6 hours if needed for breathing 18 g 12  . Dietary Management Product (ENLYTE) CAPS Take 1 capsule by  mouth daily at 12 noon. May substitute Enbrace HR if covered  May get from [email or web address] if neither are  covered 30 capsule 11  . estradiol (ESTRACE) 1 MG tablet Take 1 mg by mouth daily.    [name] LORazepam (ATIVAN) 0.5 MG tablet Take 1 tablet (0.5 mg total) by mouth every 8 (eight) hours as needed for anxiety. 60 tablet 0  . ondansetron (ZOFRAN ODT) 4 MG disintegrating tablet Take 1 tablet (4 mg total) by mouth every 8 (eight) hours as needed. 20 tablet 0  . polyethylene glycol (MIRALAX / GLYCOLAX) packet Take 17 g by mouth 3 (three) times daily as needed. 14 each 0  . promethazine (PHENERGAN) 25 MG suppository Place 1 suppository (25 mg total) rectally every 6 (six) hours as needed  for nausea or vomiting. 12 each 0  . risperiDONE (RISPERDAL) 2 MG tablet Take 1 tablet (2 mg total) by mouth at bedtime. 30 tablet 1  . sertraline (ZOLOFT) 100 MG tablet Take 1 tablet (100 mg total) by mouth daily. 30 tablet 1   No current facility-administered medications for this visit.      Psychiatric Specialty Exam: Review of Systems  Psychiatric/Behavioral: Positive for sleep disturbance. The patient is nervous/anxious.   All other systems reviewed and are negative.   Last menstrual period 10/08/2012.There is no height or weight on file to calculate BMI.  General Appearance: NA  Eye Contact:  NA  Speech:  Clear and Coherent and Normal Rate  Volume:  Normal  Mood:  Anxious and Depressed  Affect:  NA  Thought Process:  Goal Directed  Orientation:  Full (Time, Place, and Person)  Thought Content: Logical   Suicidal Thoughts:  No  Homicidal Thoughts:  No  Memory:  Immediate;   Fair Recent;   Fair Remote;   Fair  Judgement:  Good  Insight:  Fair  Psychomotor Activity:  NA  Concentration:  Concentration: Fair  Recall:  [employer] of Knowledge: Fair  Language: Good  Akathisia:  Negative  Handed:  Right  AIMS (if indicated): not done  Assets:  Communication Skills Desire for Improvement Financial  Resources/Insurance Housing Resilience Social Support  ADL's:  Intact  Cognition: WNL  Sleep:  Fair   Screenings: AIMS     Admission (Discharged) from OP Visit from 03/05/2019 in [place] [facility]  AIMS Total Score  0    AUDIT     Admission (Discharged) from OP Visit from 03/05/2019 in [place] [facility]  Alcohol Use Disorder Identification Test Final Score (AUDIT)  4       Assessment and Plan: 53yomarriedfemale with MDD/GAD and ADD.She was doing well on sertraline 200 mg and did not need lorazepam for anxiety often. Deandrea was also taking Adderall XR (last dose 40 mg) for ADD. She started a new job which she found stressful. Within 4 days she became withdrawn, not sleeping well, started to hallucinate. She was brought to [facility] by her husband on 10.4.20 and was admitted with dx of MDD severe. Adderall and Zoloft were held while risperidone 2 mg was added. She improved to the extent that she was ready for dc home on 12.11.20. Since dc she continue to feel "spacy", forgetful, has problems with focusing, task completion but subjectively feels that her mentation is becoming clearer. She denies having hallucinations and her sleep has initially improved. I have larned from her sister who is now with her that Luverta refused to take trazodone last night. When I tried to tell her that she needs to take it as well as rispridone she said she will not and that she needs a differnet "sleeping pill" because she has disturbing, vivid dreams when she takes trazodone. She reports the same problem with zolpidem. Sleep continues to vary - takes melatonin now. Mood is becoming more depressed. She is on FMLA till the end of the month.  Dx:MDD recurrent moderate;ADD; GAD  Plan:Continue risperidone 2 mg at HS and melatonin. Contibue lorazepam but take 0.25 mg bid and 0.5 mh at HS. We will restart sertraline 50 mg in am for a week then increase dose to 100  mg. Stop zolpidem CR 6.25 mg at HS. We will continue to hold off on restarting Adderall.Next appointment inone week.The plan was discussed  with patientand her [id number] had an opportunity to ask questions and these were all answered. I spend25 minutes inphone consultation with the patient.    Stephanie Acre, MD 03/22/2019, 8:46 AM

## 2019-03-29 ENCOUNTER — Other Ambulatory Visit: Payer: Self-pay

## 2019-03-29 ENCOUNTER — Ambulatory Visit (INDEPENDENT_AMBULATORY_CARE_PROVIDER_SITE_OTHER): Payer: 59 | Admitting: Psychiatry

## 2019-03-29 DIAGNOSIS — F988 Other specified behavioral and emotional disorders with onset usually occurring in childhood and adolescence: Secondary | ICD-10-CM | POA: Diagnosis not present

## 2019-03-29 DIAGNOSIS — F411 Generalized anxiety disorder: Secondary | ICD-10-CM | POA: Diagnosis not present

## 2019-03-29 DIAGNOSIS — F331 Major depressive disorder, recurrent, moderate: Secondary | ICD-10-CM

## 2019-03-29 MED ORDER — RISPERIDONE 1 MG PO TABS: 1.0000 mg | ORAL_TABLET | Freq: Every day | ORAL | 0 refills | Status: DC

## 2019-03-29 MED ORDER — AMPHETAMINE-DEXTROAMPHET ER 30 MG PO CP24: 30.0000 mg | ORAL_CAPSULE | Freq: Every day | ORAL | 0 refills | Status: DC

## 2019-03-29 NOTE — Progress Notes (Signed)
[facility] MD/PA/NP OP Progress Note  03/29/2019 8:51 AM Kathleen Key  MRN:  [id number] Interview was conducted by phone and I verified that I was speaking with the correct person using two identifiers. I discussed the limitations of evaluation and management by telemedicine and  the availability of in person appointments. Patient expressed understanding and agreed to proceed.  Chief Complaint: Lack of focus, anxiety, some depression.  HPI: 53yomarriedfemale with MDD/GAD and ADD.She was doing well on sertraline 200 mg and did not need lorazepam for anxiety often. [name] was also taking Adderall XR (last dose 40 mg) for ADD. She started a new job which she found stressful. Within 4 days she became withdrawn, not sleeping well, started to hallucinate. She was brought to [facility] by her husband on 75.4.20 and was admitted with dx of MDD severe. Adderall and Zoloft were held while risperidone 2 mg was added. She improved to the extent that she was ready for dc home on 12.11.20. Since dc she continue to feel "spacy", forgetful, has problems with focusing, task completion but subjectively feels that her mentation is becoming clearer. She denies having hallucinations and her sleep hasimproved. She still describes having vivid,at times disturbing dreams and, per husband's report, has occasional problems with reality. Mood is still depressed: we restarted sertraline 50 mg last week and she will go up to 100 mg today. She is on FMLA since 12/4 but clearly not ready yet to resume work.   Visit Diagnosis:    ICD-10-CM   1. GAD (generalized anxiety disorder)  F41.1   2. ADD (attention deficit disorder) without hyperactivity  F98.8   3. Major depressive disorder, recurrent episode, moderate (HCC)  F33.1     Past Psychiatric History: Please see intake H&P.  Past Medical History:  Past Medical History:  Diagnosis Date  . Asthma   . Fibroids    uterine  . IBS (irritable bowel syndrome)   . Pulmonary sarcoidosis  ([place])   . Shortness of breath dyspnea     Past Surgical History:  Procedure Laterality Date  . ABDOMINAL HYSTERECTOMY N/A 10/13/2012   Procedure: HYSTERECTOMY ABDOMINAL;  Surgeon: Jonnie Kind, MD;  Location: [facility];  Service: Gynecology;  Laterality: N/A;  . BILATERAL SALPINGECTOMY Bilateral 10/13/2012   Procedure: BILATERAL SALPINGECTOMY;  Surgeon: Jonnie Kind, MD;  Location: [facility];  Service: Gynecology;  Laterality: Bilateral;  . BREAST BIOPSY Left 05/2015  . BREAST EXCISIONAL BIOPSY Left 05/2015  . BREAST LUMPECTOMY WITH RADIOACTIVE SEED LOCALIZATION Left 07/28/2015   Procedure: LEFT BREAST LUMPECTOMY WITH RADIOACTIVE SEED LOCALIZATION;  Surgeon: Autumn Messing III, MD;  Location: [place];  Service: General;  Laterality: Left;  . WISDOM TOOTH EXTRACTION     Dr. Georgina Snell office-[facility], [place], [place]    Family Psychiatric History: None.  Family History:  Family History  Problem Relation Age of Onset  . Heart disease Mother        leaky valve  . Asthma Mother   . Hypertension Father        pacemaker  . Stroke Father   . Heart disease Father        pacemaker  . Fibromyalgia Sister   . Neuropathy Sister     Social History:  Social History   Socioeconomic History  . Marital status: Married    Spouse name: Not on file  . Number of children: Not on file  . Years of education: Not on file  . Highest education level: Not on file  Occupational History  . Not on file  Tobacco Use  . Smoking status: Never Smoker  . Smokeless tobacco: Never Used  Substance and Sexual Activity  . Alcohol use: Yes    Comment: occassional  . Drug use: No  . Sexual activity: Yes    Birth control/protection: Surgical  Other Topics Concern  . Not on file  Social History Narrative  . Not on file   Social Determinants of Health   Financial Resource Strain:   . Difficulty of Paying Living Expenses: Not on file  Food Insecurity:   . Worried About Paediatric nurse in the Last Year: Not on file  . Ran Out of Food in the Last Year: Not on file  Transportation Needs:   . Lack of Transportation (Medical): Not on file  . Lack of Transportation (Non-Medical): Not on file  Physical Activity:   . Days of Exercise per Week: Not on file  . Minutes of Exercise per Session: Not on file  Stress:   . Feeling of Stress : Not on file  Social Connections:   . Frequency of Communication with Friends and Family: Not on file  . Frequency of Social Gatherings with Friends and Family: Not on file  . Attends Religious Services: Not on file  . Active Member of Clubs or Organizations: Not on file  . Attends Archivist Meetings: Not on file  . Marital Status: Not on file    Allergies:  Allergies  Allergen Reactions  . Dilaudid [Hydromorphone Hcl] Itching  . Percocet [Oxycodone-Acetaminophen] Itching  . Excedrin Back & [Acetaminophen-Aspirin Buffered] Other (See Comments)    SUGAR-SPIKE, NERVOUSNESS    Metabolic Disorder Labs: Lab Results  Component Value Date   HGBA1C 5.6 03/07/2019   MPG 114.02 03/07/2019   No results found for: PROLACTIN Lab Results  Component Value Date   CHOL 194 03/07/2019   TRIG 78 03/07/2019   HDL 74 03/07/2019   CHOLHDL 2.6 03/07/2019   VLDL 16 03/07/2019   LDLCALC 104 (H) 03/07/2019   Lab Results  Component Value Date   TSH 2.368 03/07/2019    Therapeutic Level Labs: No results found for: LITHIUM No results found for: VALPROATE No components found for:  CBMZ  Current Medications: Current Outpatient Medications  Medication Sig Dispense Refill  . albuterol (VENTOLIN HFA) 108 (90 Base) MCG/ACT inhaler Inhale 2 puffs every 6 hours if needed for breathing 18 g 12  . amphetamine-dextroamphetamine (ADDERALL XR) 30 MG 24 hr capsule Take 1 capsule (30 mg total) by mouth daily. 30 capsule 0  . Dietary Management Product (ENLYTE) CAPS Take 1 capsule by mouth daily at 12 noon. May substitute Enbrace HR if  covered  May get from [email or web address] if neither are  covered 30 capsule 11  . estradiol (ESTRACE) 1 MG tablet Take 1 mg by mouth daily.    [name] LORazepam (ATIVAN) 0.5 MG tablet Take 1 tablet (0.5 mg total) by mouth every 8 (eight) hours as needed for anxiety. 60 tablet 0  . ondansetron (ZOFRAN ODT) 4 MG disintegrating tablet Take 1 tablet (4 mg total) by mouth every 8 (eight) hours as needed. 20 tablet 0  . polyethylene glycol (MIRALAX / GLYCOLAX) packet Take 17 g by mouth 3 (three) times daily as needed. 14 each 0  . promethazine (PHENERGAN) 25 MG suppository Place 1 suppository (25 mg total) rectally every 6 (six) hours as needed for nausea or vomiting. 12 each 0  . risperiDONE (RISPERDAL) 1 MG tablet  Take 1 tablet (1 mg total) by mouth at bedtime. 30 tablet 0  . sertraline (ZOLOFT) 100 MG tablet Take 1 tablet (100 mg total) by mouth daily. 30 tablet 1   No current facility-administered medications for this visit.     Psychiatric Specialty Exam: Review of Systems  Psychiatric/Behavioral: Positive for confusion and sleep disturbance. The patient is nervous/anxious.   All other systems reviewed and are negative.   Last menstrual period 10/08/2012.There is no height or weight on file to calculate BMI.  General Appearance: NA  Eye Contact:  NA  Speech:  Clear and Coherent and Normal Rate  Volume:  Decreased  Mood:  Anxious and Depressed  Affect:  NA  Thought Process:  Descriptions of Associations: Circumstantial  Orientation:  Full (Time, Place, and Person)  Thought Content: Rumination   Suicidal Thoughts:  No  Homicidal Thoughts:  No  Memory:  Immediate;   Fair Recent;   Fair Remote;   Fair  Judgement:  Fair  Insight:  Fair  Psychomotor Activity:  NA  Concentration:  Concentration: Poor  Recall:  [employer] of Knowledge: Fair  Language: Fair  Akathisia:  Negative  Handed:  Right  AIMS (if indicated): not done  Assets:  Desire for [place] Talents/Skills  ADL's:  Intact  Cognition: WNL  Sleep:  Fair   Screenings: AIMS     Admission (Discharged) from OP Visit from 03/05/2019 in [place] [facility]  AIMS Total Score  0    AUDIT     Admission (Discharged) from OP Visit from 03/05/2019 in [place] [facility]  Alcohol Use Disorder Identification Test Final Score (AUDIT)  4       Assessment and Plan: 53yomarriedfemale with MDD/GAD and ADD.She was doing well on sertraline 200 mg and did not need lorazepam for anxiety often. Marae was also taking Adderall XR (last dose 40 mg) for ADD. She started a new job which she found stressful. Within 4 days she became withdrawn, not sleeping well, started to hallucinate. She was brought to [facility] by her husband on 19.4.20 and was admitted with dx of MDD severe. Adderall and Zoloft were held while risperidone 2 mg was added. She improved to the extent that she was ready for dc home on 12.11.20. Since dc she continue to feel "spacy", forgetful, has problems with focusing, task completion but subjectively feels that her mentation is becoming clearer. She denies having hallucinations and her sleep hasimproved. She still describes having vivid,at times disturbing dreams and, per husband's report, has occasional problems with reality. Mood is still depressed: we restarted sertraline 50 mg last week and she will go up to 100 mg today. 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   Dx:MDD recurrent moderate;ADD; GAD  Plan:Continue risperidone but decrease dose to 1 mg at HS andmelatonin. Contibue lorazepam but take 0.25 mg bid and 0.5 mh at HS. We will increase sertraline to 100 mg in am and restart Adderall XR at 30 mg dose.Next appointment inone week.The plan was discussed with patientand [id number] had an opportunity to ask questions and these were all answered. I spend25 minutes inphone  consultation with the patient.    Stephanie Acre, MD 03/29/2019, 8:51 AM

## 2019-04-06 ENCOUNTER — Other Ambulatory Visit: Payer: Self-pay

## 2019-04-06 ENCOUNTER — Ambulatory Visit (INDEPENDENT_AMBULATORY_CARE_PROVIDER_SITE_OTHER): Payer: 59 | Admitting: Psychiatry

## 2019-04-06 DIAGNOSIS — F988 Other specified behavioral and emotional disorders with onset usually occurring in childhood and adolescence: Secondary | ICD-10-CM

## 2019-04-06 DIAGNOSIS — F411 Generalized anxiety disorder: Secondary | ICD-10-CM

## 2019-04-06 DIAGNOSIS — F331 Major depressive disorder, recurrent, moderate: Secondary | ICD-10-CM | POA: Diagnosis not present

## 2019-04-06 NOTE — Progress Notes (Signed)
BH MD/PA/NP OP Progress Note  04/06/2019 8:43 AM Kathleen Key  MRN:  [id number] Interview was conducted by phone and I verified that I was speaking with the correct person using two identifiers. I discussed the limitations of evaluation and management by telemedicine and  the availability of in person appointments. Patient expressed understanding and agreed to proceed.  Chief Complaint: Still depressed, anxious and forgetful.  HPI: 53yomarriedfemale with MDD/GAD and ADD.She was doing well on sertraline 200 mg and did not need lorazepam for anxiety often. Kathleen Key was also taking Adderall XR (last dose 40 mg) for ADD. She started a new job which she found stressful. Within 4 days she became withdrawn, not sleeping well, started to hallucinate. She was brought to [facility] by her husband on 29.4.20 and was admitted with dx of MDD severe. Adderall and Zoloft were held while risperidone 2 mg was added. She improved to the extent that she was ready for dc home on 12.11.20. Since dc she continue to feel "spacy", forgetful, has problems with focusing, task completion but subjectively feels that her mentation is becoming clearer. She denies having hallucinations and her sleep hasimproved. She still describes having vivid, at times disturbing dreams and, per husband's report, has occasional problems with reality. He reports that she starts to feel better as day progresses - perhaps a reflection of Adderall beginning to work. Mood is still depressed with some anxiety: we restarted sertraline 50 mg for a week then increased it to 100 mg a week ago. 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    Visit Diagnosis:    ICD-10-CM   1. GAD (generalized anxiety disorder)  F41.1   2. Major depressive disorder, recurrent episode, moderate (HCC)  F33.1   3. ADD (attention deficit disorder) without hyperactivity  F98.8     Past Psychiatric History: Please see intake H&P.  Past Medical  History:  Past Medical History:  Diagnosis Date  . Asthma   . Fibroids    uterine  . IBS (irritable bowel syndrome)   . Pulmonary sarcoidosis ([place])   . Shortness of breath dyspnea     Past Surgical History:  Procedure Laterality Date  . ABDOMINAL HYSTERECTOMY N/A 10/13/2012   Procedure: HYSTERECTOMY ABDOMINAL;  Surgeon: Jonnie Kind, MD;  Location: [facility];  Service: Gynecology;  Laterality: N/A;  . BILATERAL SALPINGECTOMY Bilateral 10/13/2012   Procedure: BILATERAL SALPINGECTOMY;  Surgeon: Jonnie Kind, MD;  Location: [facility];  Service: Gynecology;  Laterality: Bilateral;  . BREAST BIOPSY Left 05/2015  . BREAST EXCISIONAL BIOPSY Left 05/2015  . BREAST LUMPECTOMY WITH RADIOACTIVE SEED LOCALIZATION Left 07/28/2015   Procedure: LEFT BREAST LUMPECTOMY WITH RADIOACTIVE SEED LOCALIZATION;  Surgeon: Autumn Messing III, MD;  Location: [place];  Service: General;  Laterality: Left;  . WISDOM TOOTH EXTRACTION     Dr. Georgina Snell office-[facility], [place], [place]    Family Psychiatric History: None.  Family History:  Family History  Problem Relation Age of Onset  . Heart disease Mother        leaky valve  . Asthma Mother   . Hypertension Father        pacemaker  . Stroke Father   . Heart disease Father        pacemaker  . Fibromyalgia Sister   . Neuropathy Sister     Social History:  Social History   Socioeconomic History  . Marital status: Married    Spouse name:  Not on file  . Number of children: Not on file  . Years of education: Not on file  . Highest education level: Not on file  Occupational History  . Not on file  Tobacco Use  . Smoking status: Never Smoker  . Smokeless tobacco: Never Used  Substance and Sexual Activity  . Alcohol use: Yes    Comment: occassional  . Drug use: No  . Sexual activity: Yes    Birth control/protection: Surgical  Other Topics Concern  . Not on file  Social History Narrative  . Not on file   Social  Determinants of Health   Financial Resource Strain:   . Difficulty of Paying Living Expenses: Not on file  Food Insecurity:   . Worried About Charity fundraiser in the Last Year: Not on file  . Ran Out of Food in the Last Year: Not on file  Transportation Needs:   . Lack of Transportation (Medical): Not on file  . Lack of Transportation (Non-Medical): Not on file  Physical Activity:   . Days of Exercise per Week: Not on file  . Minutes of Exercise per Session: Not on file  Stress:   . Feeling of Stress : Not on file  Social Connections:   . Frequency of Communication with Friends and Family: Not on file  . Frequency of Social Gatherings with Friends and Family: Not on file  . Attends Religious Services: Not on file  . Active Member of Clubs or Organizations: Not on file  . Attends Archivist Meetings: Not on file  . Marital Status: Not on file    Allergies:  Allergies  Allergen Reactions  . Dilaudid [Hydromorphone Hcl] Itching  . Percocet [Oxycodone-Acetaminophen] Itching  . Excedrin Back & [Acetaminophen-Aspirin Buffered] Other (See Comments)    SUGAR-SPIKE, NERVOUSNESS    Metabolic Disorder Labs: Lab Results  Component Value Date   HGBA1C 5.6 03/07/2019   MPG 114.02 03/07/2019   No results found for: PROLACTIN Lab Results  Component Value Date   CHOL 194 03/07/2019   TRIG 78 03/07/2019   HDL 74 03/07/2019   CHOLHDL 2.6 03/07/2019   VLDL 16 03/07/2019   LDLCALC 104 (H) 03/07/2019   Lab Results  Component Value Date   TSH 2.368 03/07/2019    Therapeutic Level Labs: No results found for: LITHIUM No results found for: VALPROATE No components found for:  CBMZ  Current Medications: Current Outpatient Medications  Medication Sig Dispense Refill  . albuterol (VENTOLIN HFA) 108 (90 Base) MCG/ACT inhaler Inhale 2 puffs every 6 hours if needed for breathing 18 g 12  . amphetamine-dextroamphetamine (ADDERALL XR) 30 MG 24 hr capsule Take 1 capsule (30 mg  total) by mouth daily. 30 capsule 0  . Dietary Management Product (ENLYTE) CAPS Take 1 capsule by mouth daily at 12 noon. May substitute Enbrace HR if covered  May get from [email or web address] if neither are  covered 30 capsule 11  . estradiol (ESTRACE) 1 MG tablet Take 1 mg by mouth daily.    [name] LORazepam (ATIVAN) 0.5 MG tablet Take 1 tablet (0.5 mg total) by mouth every 8 (eight) hours as needed for anxiety. 60 tablet 0  . ondansetron (ZOFRAN ODT) 4 MG disintegrating tablet Take 1 tablet (4 mg total) by mouth every 8 (eight) hours as needed. 20 tablet 0  . polyethylene glycol (MIRALAX / GLYCOLAX) packet Take 17 g by mouth 3 (three) times daily as needed. 14 each 0  . promethazine (PHENERGAN) 25 MG  suppository Place 1 suppository (25 mg total) rectally every 6 (six) hours as needed for nausea or vomiting. 12 each 0  . risperiDONE (RISPERDAL) 1 MG tablet Take 1 tablet (1 mg total) by mouth at bedtime. 30 tablet 0  . sertraline (ZOLOFT) 100 MG tablet Take 1 tablet (100 mg total) by mouth daily. 30 tablet 1   No current facility-administered medications for this visit.      Psychiatric Specialty Exam: Review of Systems  Constitutional: Positive for fatigue.  Psychiatric/Behavioral: Positive for confusion and decreased concentration. The patient is nervous/anxious.   All other systems reviewed and are negative.   Last menstrual period 10/08/2012.There is no height or weight on file to calculate BMI.  General Appearance: NA  Eye Contact:  NA  Speech:  Clear and Coherent and Normal Rate  Volume:  Normal  Mood:  Depressed  Affect:  NA  Thought Process:  Descriptions of Associations: Circumstantial  Orientation:  Full (Time, Place, and Person)  Thought Content: Logical   Suicidal Thoughts:  No  Homicidal Thoughts:  No  Memory:  Immediate;   Fair Recent;   Fair Remote;   Good  Judgement:  Good  Insight:  Fair  Psychomotor Activity:  NA  Concentration:  Concentration: Fair  Recall:  [place] of Knowledge: Good  Language: Good  Akathisia:  Negative  Handed:  Right  AIMS (if indicated): not done  Assets:  Communication Skills Desire for Improvement Financial Resources/Insurance Housing Physical Health Social Support  ADL's:  Intact  Cognition: WNL  Sleep:  Good   Screenings: AIMS     Admission (Discharged) from OP Visit from 03/05/2019 in [facility]  AIMS Total Score  0    AUDIT     Admission (Discharged) from OP Visit from 03/05/2019 in [place] [facility]  Alcohol Use Disorder Identification Test Final Score (AUDIT)  4       Assessment and Plan: 53yomarriedfemale with MDD/GAD and ADD.She was doing well on sertraline 200 mg and did not need lorazepam for anxiety often. Aundrea was also taking Adderall XR (last dose 40 mg) for ADD. She started a new job which she found stressful. Within 4 days she became withdrawn, not sleeping well, started to hallucinate. She was brought to [facility] by her husband on 84.4.20 and was admitted with dx of MDD severe. Adderall and Zoloft were held while risperidone 2 mg was added. She improved to the extent that she was ready for dc home on 12.11.20. Since dc she continue to feel "spacy", forgetful, has problems with focusing, task completion but subjectively feels that her mentation is becoming clearer. She denies having hallucinations and her sleep hasimproved. She still describes having vivid, at times disturbing dreams and, per husband's report, has occasional problems with reality. He reports that she starts to feel better as day progresses - perhaps a reflection of Adderall beginning to work. Mood is still depressed with some anxiety: we restarted sertraline 50 mg for a week then increased it to 100 mg a week ago. 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   Dx:MDD recurrent moderate;ADD; GAD  Plan:Discontinue risperidone; continue  melatonin for sleep, lorazepam 0.25 mg bid and 0.5 mg at HS, sertraline 100 mg in am and Adderall XR 30 mg. We will likely increase sertraline to 150 mg next.She would benefit from and would like to have individual counseling. Next appointment intwo  weeks.We tentatively plan for her to return to work on January 25th (Monday). The plan was discussed with patientand [id number] had an opportunity to ask questions and these were all answered. I spend25 minutes inphone consultation with the patient.    Stephanie Acre, MD 04/06/2019, 8:43 AM

## 2019-04-15 ENCOUNTER — Encounter (HOSPITAL_COMMUNITY): Payer: Self-pay | Admitting: Psychology

## 2019-04-15 ENCOUNTER — Other Ambulatory Visit: Payer: Self-pay

## 2019-04-15 ENCOUNTER — Ambulatory Visit (HOSPITAL_COMMUNITY): Payer: 59 | Admitting: Psychology

## 2019-04-15 NOTE — Progress Notes (Signed)
Kathleen Key is a 54 y.o. female patient who didn't show for her virtual assessment on webex.  Counselor attempted to call as well and didn't answer- message left.        Jan Fireman, [facility]

## 2019-04-21 ENCOUNTER — Ambulatory Visit (INDEPENDENT_AMBULATORY_CARE_PROVIDER_SITE_OTHER): Payer: 59 | Admitting: Psychiatry

## 2019-04-21 ENCOUNTER — Other Ambulatory Visit: Payer: Self-pay

## 2019-04-21 DIAGNOSIS — F331 Major depressive disorder, recurrent, moderate: Secondary | ICD-10-CM | POA: Diagnosis not present

## 2019-04-21 DIAGNOSIS — F988 Other specified behavioral and emotional disorders with onset usually occurring in childhood and adolescence: Secondary | ICD-10-CM

## 2019-04-21 DIAGNOSIS — F411 Generalized anxiety disorder: Secondary | ICD-10-CM

## 2019-04-21 MED ORDER — SERTRALINE HCL 100 MG PO TABS: 150.0000 mg | ORAL_TABLET | Freq: Every day | ORAL | 1 refills | Status: DC

## 2019-04-21 MED FILL — SERTRALINE HCL 100 MG TAB: 100 | 30 days supply | Qty: 45 | Fill #0

## 2019-04-21 NOTE — Progress Notes (Signed)
BH MD/PA/NP OP Progress Note  04/21/2019 4:25 PM Kathleen Key  MRN:  [id number] Interview was conducted by phone and I verified that I was speaking with the correct person using two identifiers. I discussed the limitations of evaluation and management by telemedicine and  the availability of in person appointments. Patient expressed understanding and agreed to proceed.  Chief Complaint: Anxiety, concentration problems.  HPI: 53yomarriedfemale with MDD/GAD and ADD.She was doing well on sertraline 200 mg and did not need lorazepam for anxiety often. Kathleen Key was also taking Adderall XR (last dose 40 mg) for ADD. 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 Within 4 days she became withdrawn, not sleeping well, started to hallucinate. She was brought to [facility] by her husband on 25.4.20 and was admitted with dx of MDD severe. Adderall and Zoloft were held while risperidone 2 mg was added. She improved to the extent that she was ready for dc home on 12.11.20. Since dc she continue to feel "spacy", forgetful, has problems with focusing, task completion but subjectively feels that her mentation is becoming clearer. She denies having hallucinations and her sleep hasimproved.She is slowly improving: no hallucinations, sleep is better but still feels depressed, anxious and struggling with memory/concentration despite being back on Adderall over past few weeks. We restarted sertraline - now on 100 mg for 3 weeks, discontinued risperidone. She also takes lorazepam 0.25 mg in am and 0.5 mg at HS (in addition to melatonin). 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 [name] hopes that one more week and she can return to work on Monday February 1st.  Visit Diagnosis:    ICD-10-CM   1. Major depressive disorder, recurrent episode, moderate (HCC)  F33.1   2. ADD (attention deficit disorder) without hyperactivity  F98.8   3. GAD (generalized anxiety  disorder)  F41.1     Past Psychiatric History: Please see intake H&P.  Past Medical History:  Past Medical History:  Diagnosis Date  . Asthma   . Fibroids    uterine  . IBS (irritable bowel syndrome)   . Pulmonary sarcoidosis ([place])   . Shortness of breath dyspnea     Past Surgical History:  Procedure Laterality Date  . ABDOMINAL HYSTERECTOMY N/A 10/13/2012   Procedure: HYSTERECTOMY ABDOMINAL;  Surgeon: Jonnie Kind, MD;  Location: [facility];  Service: Gynecology;  Laterality: N/A;  . BILATERAL SALPINGECTOMY Bilateral 10/13/2012   Procedure: BILATERAL SALPINGECTOMY;  Surgeon: Jonnie Kind, MD;  Location: [facility];  Service: Gynecology;  Laterality: Bilateral;  . BREAST BIOPSY Left 05/2015  . BREAST EXCISIONAL BIOPSY Left 05/2015  . BREAST LUMPECTOMY WITH RADIOACTIVE SEED LOCALIZATION Left 07/28/2015   Procedure: LEFT BREAST LUMPECTOMY WITH RADIOACTIVE SEED LOCALIZATION;  Surgeon: Autumn Messing III, MD;  Location: [place];  Service: General;  Laterality: Left;  . WISDOM TOOTH EXTRACTION     Dr. Georgina Snell office-[facility], [place], [place]    Family Psychiatric History: None.  Family History:  Family History  Problem Relation Age of Onset  . Heart disease Mother        leaky valve  . Asthma Mother   . Hypertension Father        pacemaker  . Stroke Father   . Heart disease Father        pacemaker  . Fibromyalgia Sister   . Neuropathy Sister     Social History:  Social History  Socioeconomic History  . Marital status: Married    Spouse name: Not on file  . Number of children: Not on file  . Years of education: Not on file  . Highest education level: Not on file  Occupational History  . Not on file  Tobacco Use  . Smoking status: Never Smoker  . Smokeless tobacco: Never Used  Substance and Sexual Activity  . Alcohol use: Yes    Comment: occassional  . Drug use: No  . Sexual activity: Yes    Birth control/protection: Surgical  Other  Topics Concern  . Not on file  Social History Narrative  . Not on file   Social Determinants of Health   Financial Resource Strain:   . Difficulty of Paying Living Expenses: Not on file  Food Insecurity:   . Worried About Charity fundraiser in the Last Year: Not on file  . Ran Out of Food in the Last Year: Not on file  Transportation Needs:   . Lack of Transportation (Medical): Not on file  . Lack of Transportation (Non-Medical): Not on file  Physical Activity:   . Days of Exercise per Week: Not on file  . Minutes of Exercise per Session: Not on file  Stress:   . Feeling of Stress : Not on file  Social Connections:   . Frequency of Communication with Friends and Family: Not on file  . Frequency of Social Gatherings with Friends and Family: Not on file  . Attends Religious Services: Not on file  . Active Member of Clubs or Organizations: Not on file  . Attends Archivist Meetings: Not on file  . Marital Status: Not on file    Allergies:  Allergies  Allergen Reactions  . Dilaudid [Hydromorphone Hcl] Itching  . Percocet [Oxycodone-Acetaminophen] Itching  . Excedrin Back & [Acetaminophen-Aspirin Buffered] Other (See Comments)    SUGAR-SPIKE, NERVOUSNESS    Metabolic Disorder Labs: Lab Results  Component Value Date   HGBA1C 5.6 03/07/2019   MPG 114.02 03/07/2019   No results found for: PROLACTIN Lab Results  Component Value Date   CHOL 194 03/07/2019   TRIG 78 03/07/2019   HDL 74 03/07/2019   CHOLHDL 2.6 03/07/2019   VLDL 16 03/07/2019   LDLCALC 104 (H) 03/07/2019   Lab Results  Component Value Date   TSH 2.368 03/07/2019    Therapeutic Level Labs: No results found for: LITHIUM No results found for: VALPROATE No components found for:  CBMZ  Current Medications: Current Outpatient Medications  Medication Sig Dispense Refill  . albuterol (VENTOLIN HFA) 108 (90 Base) MCG/ACT inhaler Inhale 2 puffs every 6 hours if needed for breathing 18 g 12  .  amphetamine-dextroamphetamine (ADDERALL XR) 30 MG 24 hr capsule Take 1 capsule (30 mg total) by mouth daily. 30 capsule 0  . Dietary Management Product (ENLYTE) CAPS Take 1 capsule by mouth daily at 12 noon. May substitute Enbrace HR if covered  May get from [email or web address] if neither are  covered 30 capsule 11  . estradiol (ESTRACE) 1 MG tablet Take 1 mg by mouth daily.    [name] LORazepam (ATIVAN) 0.5 MG tablet Take 1 tablet (0.5 mg total) by mouth every 8 (eight) hours as needed for anxiety. 60 tablet 0  . ondansetron (ZOFRAN ODT) 4 MG disintegrating tablet Take 1 tablet (4 mg total) by mouth every 8 (eight) hours as needed. 20 tablet 0  . polyethylene glycol (MIRALAX / GLYCOLAX) packet Take 17 g by mouth 3 (three) times  daily as needed. 14 each 0  . promethazine (PHENERGAN) 25 MG suppository Place 1 suppository (25 mg total) rectally every 6 (six) hours as needed for nausea or vomiting. 12 each 0  . sertraline (ZOLOFT) 100 MG tablet Take 1.5 tablets (150 mg total) by mouth daily. 45 tablet 1   No current facility-administered medications for this visit.      Psychiatric Specialty Exam: Review of Systems  Psychiatric/Behavioral: Positive for decreased concentration. The patient is nervous/anxious.   All other systems reviewed and are negative.   Last menstrual period 10/08/2012.There is no height or weight on file to calculate BMI.  General Appearance: NA  Eye Contact:  NA  Speech:  Clear and Coherent and Normal Rate  Volume:  Normal  Mood:  Anxious  Affect:  NA  Thought Process:  Goal Directed  Orientation:  Full (Time, Place, and Person)  Thought Content: Logical   Suicidal Thoughts:  No  Homicidal Thoughts:  No  Memory:  Immediate;   Fair Recent;   Fair Remote;   Fair  Judgement:  Good  Insight:  Fair  Psychomotor Activity:  NA  Concentration:  Concentration: Fair  Recall:  [employer] of Knowledge: Fair  Language: Good  Akathisia:  Negative  Handed:  Right  AIMS (if  indicated): not done  Assets:  Desire for Improvement Housing Resilience Social Support  ADL's:  Intact  Cognition: WNL  Sleep:  Fair   Screenings: AIMS     Admission (Discharged) from OP Visit from 03/05/2019 in [place] [facility]  AIMS Total Score  0    AUDIT     Admission (Discharged) from OP Visit from 03/05/2019 in [facility] [facility]  Alcohol Use Disorder Identification Test Final Score (AUDIT)  4       Assessment and Plan: 53yomarriedfemale with MDD/GAD and ADD.She was doing well on sertraline 200 mg and did not need lorazepam for anxiety often. Cherae was also taking Adderall XR (last dose 40 mg) for ADD. 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 Within 4 days she became withdrawn, not sleeping well, started to hallucinate. She was brought to [facility] by her husband on 47.4.20 and was admitted with dx of MDD severe. Adderall and Zoloft were held while risperidone 2 mg was added. She improved to the extent that she was ready for dc home on 12.11.20. Since dc she continue to feel "spacy", forgetful, has problems with focusing, task completion but subjectively feels that her mentation is becoming clearer. She denies having hallucinations and her sleep hasimproved.She is slowly improving: no hallucinations, sleep is better but still feels depressed, anxious and struggling with memory/concentration despite being back on Adderall over past few weeks. We restarted sertraline - now on 100 mg for 3 weeks, discontinued risperidone. She also takes lorazepam 0.25 mg in am and 0.5 mg at HS (in addition to melatonin). 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 Hyacinth hopes that one more week and she can return to work on Monday February 1st.  Dx:MDD recurrent moderate;ADD; GAD  Plan:Increase sertraline to 150 mg daily, continue melatonin for sleep, lorazepam 0.25 mg in  AM and 0.5 mg at HS,and AdderallXR 30 mg. We now tentatively plan for her to return to work on February 1st (Monday). The plan was discussed with patientand [id number] had an opportunity to ask questions and these were all  answered. I spend25 minutes inphone consultation with the patient.   Stephanie Acre, MD 04/21/2019, 4:25 PM

## 2019-04-27 DIAGNOSIS — G5622 Lesion of ulnar nerve, left upper limb: Secondary | ICD-10-CM | POA: Diagnosis not present

## 2019-04-27 DIAGNOSIS — G5620 Lesion of ulnar nerve, unspecified upper limb: Secondary | ICD-10-CM | POA: Insufficient documentation

## 2019-04-30 ENCOUNTER — Ambulatory Visit (INDEPENDENT_AMBULATORY_CARE_PROVIDER_SITE_OTHER): Payer: 59 | Admitting: Psychiatry

## 2019-04-30 ENCOUNTER — Other Ambulatory Visit: Payer: Self-pay

## 2019-04-30 ENCOUNTER — Other Ambulatory Visit (HOSPITAL_COMMUNITY): Payer: Self-pay | Admitting: Psychiatry

## 2019-04-30 DIAGNOSIS — F331 Major depressive disorder, recurrent, moderate: Secondary | ICD-10-CM

## 2019-04-30 DIAGNOSIS — F411 Generalized anxiety disorder: Secondary | ICD-10-CM

## 2019-04-30 DIAGNOSIS — F988 Other specified behavioral and emotional disorders with onset usually occurring in childhood and adolescence: Secondary | ICD-10-CM | POA: Diagnosis not present

## 2019-04-30 DIAGNOSIS — R419 Unspecified symptoms and signs involving cognitive functions and awareness: Secondary | ICD-10-CM

## 2019-04-30 MED ORDER — AMPHETAMINE-DEXTROAMPHET ER 20 MG PO CP24: 20.0000 mg | ORAL_CAPSULE | Freq: Two times a day (BID) | ORAL | 0 refills | Status: DC

## 2019-04-30 MED FILL — ADDERALL XR 20 MG CAP SA: 20 | 30 days supply | Qty: 60 | Fill #0

## 2019-04-30 NOTE — Progress Notes (Signed)
BH MD/PA/NP OP Progress Note  04/30/2019 10:14 AM GRIFFYN CLOTHIER  MRN:  [id number] Interview was conducted by phone and I verified that I was speaking with the correct person using two identifiers. I discussed the limitations of evaluation and management by telemedicine and  the availability of in person appointments. Patient expressed understanding and agreed to proceed.  Chief Complaint: Forgetfulness, lack of focus.  HPI: 53yomarriedfemale with MDD/GAD and ADD.She was doing well on sertraline 200 mg and did not need lorazepam for anxiety often. Yannet was also taking Adderall XR (last dose 40 mg) for ADD. 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 Within 4 days she became withdrawn, not sleeping well, started to hallucinate. She was brought to [facility] by her husband on 69.4.20 and was admitted with dx of MDD severe. Adderall and Zoloft were held while risperidone 2 mg was added. She improved to the extent that she was ready for dc home on 12.11.20. Since dc she continue to feel "spacy", forgetful, has problems with focusing, task completion but subjectively feels that her mentation is becoming clearer. She denies having hallucinations and her sleep hasimproved.She is slowly improving: no hallucinations, sleep is better but still feels depressed, anxious and struggling with short term memory/concentration despite being back on Adderall over past few weeks. We restarted sertraline - now on 100 mg for 3 weeks, discontinued risperidone. She also takes lorazepam 0.25 mg in am and 0.5 mg at HS (in addition to melatonin). She has been on FMLAsince 12/4 butstill does not feel quite ready yet to resume work as Quarry manager. Codee fears that her problems with memory put her and her patients at risk. She is not only forgetful but would at times feel disoriented - e.g. finds self in places not realizing where she is and how she got there. There is no family history of dementia but these symptoms are  worrisome as they are not entirely explainable by ADHD. She plans to see a neurologist as well. I am going to change her Adderall XR dosing to 20 mg bid. At this time we will extend her FMLA for another two weeks - we will see if change in Adderall will help with focusing (and memory) and she will perhaps be able to be seen by neurologist as well in that time frame.  Visit Diagnosis:    ICD-10-CM   1. GAD (generalized anxiety disorder)  F41.1   2. ADD (attention deficit disorder) without hyperactivity  F98.8   3. Major depressive disorder, recurrent episode, moderate (HCC)  F33.1     Past Psychiatric History: Please see intake H&P.  Past Medical History:  Past Medical History:  Diagnosis Date  . Asthma   . Fibroids    uterine  . IBS (irritable bowel syndrome)   . Pulmonary sarcoidosis ([place])   . Shortness of breath dyspnea     Past Surgical History:  Procedure Laterality Date  . ABDOMINAL HYSTERECTOMY N/A 10/13/2012   Procedure: HYSTERECTOMY ABDOMINAL;  Surgeon: Jonnie Kind, MD;  Location: [facility];  Service: Gynecology;  Laterality: N/A;  . BILATERAL SALPINGECTOMY Bilateral 10/13/2012   Procedure: BILATERAL SALPINGECTOMY;  Surgeon: Jonnie Kind, MD;  Location: [facility];  Service: Gynecology;  Laterality: Bilateral;  . BREAST BIOPSY Left 05/2015  . BREAST EXCISIONAL BIOPSY Left 05/2015  . BREAST LUMPECTOMY WITH RADIOACTIVE SEED LOCALIZATION Left 07/28/2015   Procedure: LEFT BREAST LUMPECTOMY WITH RADIOACTIVE SEED LOCALIZATION;  Surgeon: Autumn Messing III, MD;  Location: [facility];  Service: General;  Laterality: Left;  . WISDOM TOOTH EXTRACTION     Dr. Georgina Snell office-[facility], [place], [place]    Family Psychiatric History: None.  Family History:  Family History  Problem Relation Age of Onset  . Heart disease Mother        leaky valve  . Asthma Mother   . Hypertension Father        pacemaker  . Stroke Father   . Heart disease Father         pacemaker  . Fibromyalgia Sister   . Neuropathy Sister     Social History:  Social History   Socioeconomic History  . Marital status: Married    Spouse name: Not on file  . Number of children: Not on file  . Years of education: Not on file  . Highest education level: Not on file  Occupational History  . Not on file  Tobacco Use  . Smoking status: Never Smoker  . Smokeless tobacco: Never Used  Substance and Sexual Activity  . Alcohol use: Yes    Comment: occassional  . Drug use: No  . Sexual activity: Yes    Birth control/protection: Surgical  Other Topics Concern  . Not on file  Social History Narrative  . Not on file   Social Determinants of Health   Financial Resource Strain:   . Difficulty of Paying Living Expenses: Not on file  Food Insecurity:   . Worried About Charity fundraiser in the Last Year: Not on file  . Ran Out of Food in the Last Year: Not on file  Transportation Needs:   . Lack of Transportation (Medical): Not on file  . Lack of Transportation (Non-Medical): Not on file  Physical Activity:   . Days of Exercise per Week: Not on file  . Minutes of Exercise per Session: Not on file  Stress:   . Feeling of Stress : Not on file  Social Connections:   . Frequency of Communication with Friends and Family: Not on file  . Frequency of Social Gatherings with Friends and Family: Not on file  . Attends Religious Services: Not on file  . Active Member of Clubs or Organizations: Not on file  . Attends Archivist Meetings: Not on file  . Marital Status: Not on file    Allergies:  Allergies  Allergen Reactions  . Dilaudid [Hydromorphone Hcl] Itching  . Percocet [Oxycodone-Acetaminophen] Itching  . Excedrin Back & [Acetaminophen-Aspirin Buffered] Other (See Comments)    SUGAR-SPIKE, NERVOUSNESS    Metabolic Disorder Labs: Lab Results  Component Value Date   HGBA1C 5.6 03/07/2019   MPG 114.02 03/07/2019   No results found for:  PROLACTIN Lab Results  Component Value Date   CHOL 194 03/07/2019   TRIG 78 03/07/2019   HDL 74 03/07/2019   CHOLHDL 2.6 03/07/2019   VLDL 16 03/07/2019   LDLCALC 104 (H) 03/07/2019   Lab Results  Component Value Date   TSH 2.368 03/07/2019    Therapeutic Level Labs: No results found for: LITHIUM No results found for: VALPROATE No components found for:  CBMZ  Current Medications: Current Outpatient Medications  Medication Sig Dispense Refill  . albuterol (VENTOLIN HFA) 108 (90 Base) MCG/ACT inhaler Inhale 2 puffs every 6 hours if needed for breathing 18 g 12  . amphetamine-dextroamphetamine (ADDERALL XR) 20 MG 24 hr capsule Take 1 capsule (20 mg total) by mouth 2 (two) times daily with breakfast and lunch. 60 capsule  0  . Dietary Management Product (ENLYTE) CAPS Take 1 capsule by mouth daily at 12 noon. May substitute Enbrace HR if covered  May get from [facility] if neither are  covered 30 capsule 11  . estradiol (ESTRACE) 1 MG tablet Take 1 mg by mouth daily.    . ondansetron (ZOFRAN ODT) 4 MG disintegrating tablet Take 1 tablet (4 mg total) by mouth every 8 (eight) hours as needed. 20 tablet 0  . polyethylene glycol (MIRALAX / GLYCOLAX) packet Take 17 g by mouth 3 (three) times daily as needed. 14 each 0  . promethazine (PHENERGAN) 25 MG suppository Place 1 suppository (25 mg total) rectally every 6 (six) hours as needed for nausea or vomiting. 12 each 0  . sertraline (ZOLOFT) 100 MG tablet Take 1.5 tablets (150 mg total) by mouth daily. 45 tablet 1   No current facility-administered medications for this visit.     Psychiatric Specialty Exam: Review of Systems  Psychiatric/Behavioral: Positive for confusion and decreased concentration. The patient is nervous/anxious.   All other systems reviewed and are negative.   Last menstrual period 10/08/2012.There is no height or weight on file to calculate BMI.  General Appearance: NA  Eye Contact:  NA  Speech:  Clear and  Coherent and Normal Rate  Volume:  Normal  Mood:  Less depressed but anxious.  Affect:  NA  Thought Process:  Goal Directed and Linear  Orientation:  Full (Time, Place, and Person)  Thought Content: Logical   Suicidal Thoughts:  No  Homicidal Thoughts:  No  Memory:  Immediate;   Poor Recent;   Fair Remote;   Good  Judgement:  Good  Insight:  Good  Psychomotor Activity:  NA  Concentration:  Concentration: Fair  Recall:  Poor  Fund of Knowledge: Good  Language: Good  Akathisia:  Negative  Handed:  Right  AIMS (if indicated): not done  Assets:  Communication Skills Desire for Improvement Financial Resources/Insurance Housing Social Support Talents/Skills  ADL's:  Intact  Cognition: WNL  Sleep:  Fair   Screenings: AIMS     Admission (Discharged) from OP Visit from 03/05/2019 in [facility]  AIMS Total Score  0    AUDIT     Admission (Discharged) from OP Visit from 03/05/2019 in [facility]  Alcohol Use Disorder Identification Test Final Score (AUDIT)  4       Assessment and Plan: 53yomarriedfemale with MDD/GAD and ADD.She was doing well on sertraline 200 mg and did not need lorazepam for anxiety often. Ramaya was also taking Adderall XR (last dose 40 mg) for ADD. 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 Within 4 days she became withdrawn, not sleeping well, started to hallucinate. She was brought to [facility] by her husband on 55.4.20 and was admitted with dx of MDD severe. Adderall and Zoloft were held while risperidone 2 mg was added. She improved to the extent that she was ready for dc home on 12.11.20. Since dc she continue to feel "spacy", forgetful, has problems with focusing, task completion but subjectively feels that her mentation is becoming clearer. She denies having hallucinations and her sleep hasimproved.She is slowly improving: no hallucinations, sleep is better but still  feels depressed, anxious and struggling with short term memory/concentration despite being back on Adderall over past few weeks. We restarted sertraline - now on 100 mg for 3 weeks, discontinued risperidone. She also takes lorazepam 0.25 mg in  am and 0.5 mg at HS (in addition to melatonin). She has been on FMLAsince 12/4 butstill does not feel quite ready yet to resume work as Quarry manager. Aurie fears that her problems with memory put her and her patients at risk. She is not only forgetful but would at times feel disoriented - e.g. finds self in places not realizing where she is and how she got there. There is no family history of dementia but these symptoms are worrisome as they are not entirely explainable by ADHD. She plans to see a neurologist as well. I am going to change her Adderall XR dosing to 20 mg bid. At this time we will extend her FMLA for another two weeks - we will see if change in Adderall will help with focusing (and memory) and she will perhaps be able to be seen by neurologist as well in that time frame.  Dx:MDD recurrent moderate;ADD; GAD; Neurocognitive disorder  Plan:Continue sertraline to 150 mg daily, continue melatoninfor sleep,lorazepam 0.5 mgat HS,and AdderallXR change to 20 mg bid. We now tentatively plan for her to return to work on February 15.The plan was discussed with patientand [id number] had an opportunity to ask questions and these were all answered. I spend25 minutes inphone consultation with the patient.    Stephanie Acre, MD 04/30/2019, 10:14 AM

## 2019-05-03 ENCOUNTER — Encounter (HOSPITAL_COMMUNITY): Payer: Self-pay | Admitting: Psychology

## 2019-05-03 ENCOUNTER — Other Ambulatory Visit: Payer: Self-pay

## 2019-05-03 ENCOUNTER — Ambulatory Visit (INDEPENDENT_AMBULATORY_CARE_PROVIDER_SITE_OTHER): Payer: 59 | Admitting: Psychology

## 2019-05-03 ENCOUNTER — Telehealth (HOSPITAL_COMMUNITY): Payer: Self-pay | Admitting: *Deleted

## 2019-05-03 DIAGNOSIS — F331 Major depressive disorder, recurrent, moderate: Secondary | ICD-10-CM

## 2019-05-03 DIAGNOSIS — F411 Generalized anxiety disorder: Secondary | ICD-10-CM | POA: Diagnosis not present

## 2019-05-03 DIAGNOSIS — R1909 Other intra-abdominal and pelvic swelling, mass and lump: Secondary | ICD-10-CM | POA: Diagnosis not present

## 2019-05-03 NOTE — Telephone Encounter (Signed)
Writer faxed pt information to [facility] for referral to Dr. Delice Lesch. Fax # [phone].

## 2019-05-03 NOTE — Progress Notes (Signed)
Virtual Visit via Telephone Note  I connected with Kathleen Key on 05/03/19 at 12:30 PM EST by telephone and verified that I am speaking with the correct person using two identifiers.   I discussed the limitations, risks, security and privacy concerns of performing an evaluation and management service by telephone and the availability of in person appointments. I also discussed with the patient that there may be a patient responsible charge related to this service. The patient expressed understanding and agreed to proceed.    I discussed the assessment and treatment plan with the patient. The patient was provided an opportunity to ask questions and all were answered. The patient agreed with the plan and demonstrated an understanding of the instructions.   The patient was advised to call back or seek an in-person evaluation if the symptoms worsen or if the condition fails to improve as anticipated.  I provided 55 minutes of non-face-to-face time during this encounter.   Kathleen Key [facility]  Comprehensive Clinical Assessment (CCA) Note  05/03/2019 Kathleen Key [id number]  Visit Diagnosis:      ICD-10-CM   1. GAD (generalized anxiety disorder)  F41.1   2. Major depressive disorder, recurrent episode, moderate (HCC)  F33.1       CCA Part One  Part One has been completed on paper by the patient.  (See scanned document in Chart Review)  CCA Part Two A  Intake/Chief Complaint:  CCA Intake With Chief Complaint CCA Part Two Date: 05/03/19 CCA Part Two Time: 1235 Chief Complaint/Presenting Problem: Pt is referred for counseling by Dr. Montel Culver.  She has been under his care since 04/2018 for MDD and GAD, but has had a long hx of depression and anxiety since her 48s.  pt depression and anxiety became very severe in early december in which her husband sought assessment and pt was admitted to inpt tx for MDD w/ psychosis.  pt reports she doesn't really recall anything from that time.  she  reports that her husband informed her that she was just sitting around and unable to get ready for work that [place] unresponsive.  pt reported that stressors at the time included had just started a new job and was very nervous about having to pass skill tests for this job.  pt reported that she also had COVID-19 in early october and wonders if that had played any impact.  pt reports that her current stressor is not being able to work. Patients Currently Reported Symptoms/Problems: Pt reported that she still struggles w/ being forgetful about things and not being able to think clearly and problem solve.  pt reports that she never had that severity of depression or anxiety previously.  pt denies SI or self harm.  pt reports she feels down about not working, feels useless and not contributing.  pt reports that she did have a better week this past week "feeling more like myself, going out over weekend to socialize w/ friends, doing brain games and puzzles".  Pt reports she is still on FMLA for next 2 weeks.  pt doesn't have an appointment w/ Neurology yet, they haven't called to set up appointment.  pt reports getting anxious when having to do something new, unfamiliar.  pt spends time at sister's in morning on weekdays to keep a schedule/routine. Collateral Involvement: Dr. Audria Nine notes. Individual's Strengths: support of her husband, mom and sisters.  Pt wants to return to work when she is able. Individual's Preferences: "I guess a change in attitude towards self,  not put self down, i have little self esteem and my worst critic" and not pscyhing myself out about things stressful (like those tests)" Type of Services Patient Feels Are Needed: counseling and medication managemetn  Mental Health Symptoms Depression:  Depression: Change in energy/activity, Difficulty Concentrating, Worthlessness, Sleep (too much or little)  Mania:  Mania: N/A  Anxiety:   Anxiety: Difficulty concentrating, Worrying,  Sleep, Tension  Psychosis:  Psychosis: N/A(pt did experience psychosis leading to inpt stay per notes- pt doesn't recall)  Trauma:  Trauma: N/A  Obsessions:  Obsessions: N/A  Compulsions:  Compulsions: N/A  Inattention:  Inattention: (ADHD dx)  Hyperactivity/Impulsivity:  Hyperactivity/Impulsivity: N/A  Oppositional/Defiant Behaviors:  Oppositional/Defiant Behaviors: N/A  Borderline Personality:  Emotional Irregularity: N/A  Other Mood/Personality Symptoms:      Mental Status Exam Appearance and self-care  Stature:  Stature: (n/a on the phone)  Weight:  Weight: (n/a on the phone)  Clothing:  Clothing: (n/a on the phone)  Grooming:  Grooming: (n/a on the phone)  Cosmetic use:  Cosmetic Use: (n/a on the phone)  Posture/gait:  Posture/Gait: (n/a on the phone)  Motor activity:  Motor Activity: (n/a on th phone)  Sensorium  Attention:  Attention: Normal  Concentration:  Concentration: Normal  Orientation:  Orientation: X5  Recall/memory:  Recall/Memory: Defective in Recent  Affect and Mood  Affect:  Affect: Blunted  Mood:  Mood: Anxious, Depressed  Relating  Eye contact:  Eye Contact: (n/a on the phone)  Facial expression:  Facial Expression: (n/a on the phone)  Attitude toward examiner:  Attitude Toward Examiner: Cooperative  Thought and Language  Speech flow:    Thought content:  Thought Content: Appropriate to mood and circumstances  Preoccupation:     Hallucinations:     Organization:     Transport planner of Knowledge:  Fund of Knowledge: Average  Intelligence:  Intelligence: Average  Abstraction:  Abstraction: Normal  Judgement:  Judgement: Normal  Reality Testing:  Reality Testing: Adequate  Insight:  Insight: Good, Fair  Decision Making:  Decision Making: Only simple  Social Functioning  Social Maturity:  Social Maturity: Isolates  Social Judgement:  Social Judgement: Normal  Stress  Stressors:  Stressors: Transitions, Work  Coping Ability:  Coping  Ability: English as a second language teacher Deficits:     Supports:      Family and Psychosocial History: Family history Marital status: Married Number of Years Married: 108 What types of issues is patient dealing with in the relationship?: no problems- husband is big support Are you sexually active?: Yes Does patient have children?: No  Childhood History:  Childhood History By whom was/is the patient raised?: Both parents Description of patient's relationship with caregiver when they were a child: pt reported that good relationship w/ parents growing up- she was sheltered Patient's description of current relationship with people who raised him/her: mom- very close with.  dad- died in 2014/07/09 from congestive heart failure. Does patient have siblings?: Yes Number of Siblings: 2 Description of patient's current relationship with siblings: 2 older sisters- close w/ and talk and visit often Did patient suffer any verbal/emotional/physical/sexual abuse as a child?: No Did patient suffer from severe childhood neglect?: No Has patient ever been sexually abused/assaulted/raped as an adolescent or adult?: No Was the patient ever a victim of a crime or a disaster?: No Witnessed domestic violence?: No Has patient been effected by domestic violence as an adult?: No  CCA Part Two B  Employment/Work Situation: Employment / Work Situation Employment situation: Employed  Where is patient currently employed?: Pt is a Scientist, 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 How long has patient been employed?: Pt employed for 1 week prior to going on leave Patient's job has been impacted by current illness: Yes Describe how patient's job has been impacted: pt reports she will keep a job w/ the health system, but lost the position she applied for. 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 Where was the patient employed at that  time?: 14 years Did You Receive Any Psychiatric Treatment/Services While in the Military?: No Are There Guns or Other Weapons in [place]?: Yes Types of Guns/Weapons: her husband is a Therapist, occupational?: Yes(locked up)  Education: Education Did Teacher, adult education From [employer]?: Yes Did Physicist, medical?: No Did You Have An Individualized Education Program (IIEP): No Did You Have Any Difficulty At School?: Yes Were Any Medications Ever Prescribed For These Difficulties?: No  Religion: Religion/Spirituality Are You A Religious Person?: Yes What is Your Religious Affiliation?: Christian  Leisure/Recreation:    Exercise/Diet: Exercise/Diet Do You Exercise?: No Have You Gained or Lost A Significant Amount of Weight in the Past Six Months?: No Do You Follow a Special Diet?: No Do You Have Any Trouble Sleeping?: Yes Explanation of Sleeping Difficulties: pt was having problems w/ sleeping- fallings asleep- but her sleep has improved  CCA Part Two C  Alcohol/Drug Use: Alcohol / Drug Use History of alcohol / drug use?: No history of alcohol / drug abuse                      CCA Part Three  ASAM's:  Six Dimensions of Multidimensional Assessment  Dimension 1:  Acute Intoxication and/or Withdrawal Potential:     Dimension 2:  Biomedical Conditions and Complications:     Dimension 3:  Emotional, Behavioral, or Cognitive Conditions and Complications:     Dimension 4:  Readiness to Change:     Dimension 5:  Relapse, Continued use, or Continued Problem Potential:     Dimension 6:  Recovery/Living Environment:      Substance use Disorder (SUD)    Social Function:  Social Functioning Social Maturity: Isolates Social Judgement: Normal  Stress:  Stress Stressors: Transitions, Work Coping Ability: Overwhelmed Patient Takes Medications The Way The Doctor Instructed?: Yes Priority Risk: Low Acuity  Risk Assessment- Self-Harm Potential: Risk  Assessment For Self-Harm Potential Thoughts of Self-Harm: No current thoughts Method: No plan Availability of Means: No access/NA  Risk Assessment -Dangerous to Others Potential: Risk Assessment For Dangerous to Others Potential Method: No Plan Availability of Means: No access or NA  DSM5 Diagnoses: Patient Active Problem List   Diagnosis Date Noted  . Severe recurrent major depression with psychotic features ([place]) 03/05/2019  . COVID-19 virus infection 01/17/2019  . Major depressive disorder, recurrent episode, moderate ([place]) 05/26/2018  . GAD (generalized anxiety disorder) 04/29/2018  . ADD (attention deficit disorder) without hyperactivity 04/29/2018  . Rash and nonspecific skin eruption 04/25/2017  . Asthma, cough variant 02/23/2014  . Routine gynecological examination 11/29/2013  . Sarcoid ([place]) 12/09/2012  . Post op infection 10/15/2012    Patient Centered Plan: Patient is on the following Treatment Plan(s):  Anxiety and Depression  Recommendations for Services/Supports/Treatments: Recommendations for Services/Supports/Treatments Recommendations For Services/Supports/Treatments: Individual Therapy, Medication Management  Treatment Plan Summary: OP Treatment Plan Summary: attend  counseling weekly to assist coping w/ anxiety, depression.  Pt to f/u as scheduled w/ Dr. Montel Culver.  Pt to f/u w/ scheduling w/ [facility].  Pt to f/u w/ counseling in 1 week via webex.  Counselor will call if not connected as wasn't able to for today's appointments.  Kathleen Key

## 2019-05-10 DIAGNOSIS — G5622 Lesion of ulnar nerve, left upper limb: Secondary | ICD-10-CM | POA: Diagnosis not present

## 2019-05-12 DIAGNOSIS — R1903 Right lower quadrant abdominal swelling, mass and lump: Secondary | ICD-10-CM | POA: Diagnosis not present

## 2019-05-14 ENCOUNTER — Other Ambulatory Visit: Payer: Self-pay

## 2019-05-14 ENCOUNTER — Ambulatory Visit (INDEPENDENT_AMBULATORY_CARE_PROVIDER_SITE_OTHER)
Admission: RE | Admit: 2019-05-14 | Discharge: 2019-05-14 | Disposition: A | Discharge status: Home / Self Care | Payer: 59 | Source: Ambulatory Visit | Attending: Internal Medicine | Admitting: Internal Medicine

## 2019-05-14 ENCOUNTER — Ambulatory Visit (INDEPENDENT_AMBULATORY_CARE_PROVIDER_SITE_OTHER): Payer: 59 | Admitting: Psychiatry

## 2019-05-14 DIAGNOSIS — D869 Sarcoidosis, unspecified: Secondary | ICD-10-CM | POA: Diagnosis not present

## 2019-05-14 DIAGNOSIS — F331 Major depressive disorder, recurrent, moderate: Secondary | ICD-10-CM | POA: Diagnosis not present

## 2019-05-14 DIAGNOSIS — F411 Generalized anxiety disorder: Secondary | ICD-10-CM

## 2019-05-14 DIAGNOSIS — F988 Other specified behavioral and emotional disorders with onset usually occurring in childhood and adolescence: Secondary | ICD-10-CM

## 2019-05-14 MED ORDER — LORAZEPAM 0.5 MG PO TABS: 0.5000 mg | ORAL_TABLET | Freq: Every day | ORAL | 1 refills | Status: DC

## 2019-05-14 MED ORDER — AMPHETAMINE-DEXTROAMPHET ER 20 MG PO CP24: 20.0000 mg | ORAL_CAPSULE | Freq: Two times a day (BID) | ORAL | 0 refills | Status: DC

## 2019-05-14 MED FILL — LORazepam 0.5 MG TABS: 0.5 | 30 days supply | Qty: 30 | Fill #0

## 2019-05-14 NOTE — Progress Notes (Signed)
BH MD/PA/NP OP Progress Note  05/14/2019 9:42 AM Kathleen Key  MRN:  [id number] Interview was conducted by phone and I verified that I was speaking with the correct person using two identifiers. I discussed the limitations of evaluation and management by telemedicine and  the availability of in person appointments. Patient expressed understanding and agreed to proceed.  Chief Complaint: Less depressed, better focusing/memory.  HPI: 53yomarriedfemale with MDD/GAD and ADD.She was doing well on sertraline 200 mg and did not need lorazepam for anxiety often. Yvonnia was also taking Adderall XR (last dose 40 mg) for ADD. 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 Within 4 days she became withdrawn, not sleeping well, started to hallucinate. She was brought to [facility] by her husband on 38.4.20 and was admitted with dx of MDD severe. Adderall and Zoloft were held while risperidone 2 mg was added. She improved to the extent that she was ready for dc home on 12.11.20. Since dc she continue to feel "spacy", forgetful, has problems with focusing, task completion but subjectively feels that her mentation is becoming clearer. She denies having hallucinations and her sleep hasimproved.Sheis slowly improving: no hallucinations, sleep is better but still feels depressed, anxious and struggling with short term memory/concentration despite being back on Adderall over past few weeks. We restarted sertraline- now on 100 mg for 3 weeks, discontinued risperidone. She also takes lorazepam 0.25 mg in am and 0.5 mg at HS (in addition to melatonin).Shehas beenon FMLAsince 12/4. She was hesitant and anxious about returning to work because of ongoing problems with memory. She was not only forgetful but would at times feel disoriented - e.g. finds self in places not realizing where she is and how she got there. There is no family history of dementia but these symptoms are worrisome as they are not entirely  explainable by ADHD. She planed to see a neurologist as well. Over past two weeks she (and husband) noticed improvement in cognition: better focusing, no overt memory problems. She now feels ready to try to go back to work and put off plan to see a neurology  Visit Diagnosis:    ICD-10-CM   1. GAD (generalized anxiety disorder)  F41.1   2. Major depressive disorder, recurrent episode, moderate (HCC)  F33.1   3. ADD (attention deficit disorder) without hyperactivity  F98.8     Past Psychiatric History: Please see intake H&P.  Past Medical History:  Past Medical History:  Diagnosis Date  . Asthma   . Fibroids    uterine  . IBS (irritable bowel syndrome)   . Pulmonary sarcoidosis ([place])   . Shortness of breath dyspnea     Past Surgical History:  Procedure Laterality Date  . ABDOMINAL HYSTERECTOMY N/A 10/13/2012   Procedure: HYSTERECTOMY ABDOMINAL;  Surgeon: Jonnie Kind, MD;  Location: [facility];  Service: Gynecology;  Laterality: N/A;  . BILATERAL SALPINGECTOMY Bilateral 10/13/2012   Procedure: BILATERAL SALPINGECTOMY;  Surgeon: Jonnie Kind, MD;  Location: [facility];  Service: Gynecology;  Laterality: Bilateral;  . BREAST BIOPSY Left 05/2015  . BREAST EXCISIONAL BIOPSY Left 05/2015  . BREAST LUMPECTOMY WITH RADIOACTIVE SEED LOCALIZATION Left 07/28/2015   Procedure: LEFT BREAST LUMPECTOMY WITH RADIOACTIVE SEED LOCALIZATION;  Surgeon: Autumn Messing III, MD;  Location: [place];  Service: General;  Laterality: Left;  . WISDOM TOOTH EXTRACTION     Dr. Georgina Snell office-[facility], [place], [place]    Family Psychiatric History: None.  Family History:  Family History  Problem Relation Age of Onset  . Heart disease Mother        leaky valve  . Asthma Mother   . Hypertension Father        pacemaker  . Stroke Father   . Heart disease Father        pacemaker  . Fibromyalgia Sister   . Neuropathy Sister     Social History:  Social History   Socioeconomic  History  . Marital status: Married    Spouse name: Not on file  . Number of children: Not on file  . Years of education: Not on file  . Highest education level: Not on file  Occupational History  . Not on file  Tobacco Use  . Smoking status: Never Smoker  . Smokeless tobacco: Never Used  Substance and Sexual Activity  . Alcohol use: Yes    Comment: occassional  . Drug use: No  . Sexual activity: Yes    Birth control/protection: Surgical  Other Topics Concern  . Not on file  Social History Narrative  . Not on file   Social Determinants of Health   Financial Resource Strain:   . Difficulty of Paying Living Expenses: Not on file  Food Insecurity:   . Worried About Charity fundraiser in the Last Year: Not on file  . Ran Out of Food in the Last Year: Not on file  Transportation Needs:   . Lack of Transportation (Medical): Not on file  . Lack of Transportation (Non-Medical): Not on file  Physical Activity:   . Days of Exercise per Week: Not on file  . Minutes of Exercise per Session: Not on file  Stress:   . Feeling of Stress : Not on file  Social Connections:   . Frequency of Communication with Friends and Family: Not on file  . Frequency of Social Gatherings with Friends and Family: Not on file  . Attends Religious Services: Not on file  . Active Member of Clubs or Organizations: Not on file  . Attends Archivist Meetings: Not on file  . Marital Status: Not on file    Allergies:  Allergies  Allergen Reactions  . Dilaudid [Hydromorphone Hcl] Itching  . Percocet [Oxycodone-Acetaminophen] Itching  . Excedrin Back & [Acetaminophen-Aspirin Buffered] Other (See Comments)    SUGAR-SPIKE, NERVOUSNESS    Metabolic Disorder Labs: Lab Results  Component Value Date   HGBA1C 5.6 03/07/2019   MPG 114.02 03/07/2019   No results found for: PROLACTIN Lab Results  Component Value Date   CHOL 194 03/07/2019   TRIG 78 03/07/2019   HDL 74 03/07/2019   CHOLHDL 2.6  03/07/2019   VLDL 16 03/07/2019   LDLCALC 104 (H) 03/07/2019   Lab Results  Component Value Date   TSH 2.368 03/07/2019    Therapeutic Level Labs: No results found for: LITHIUM No results found for: VALPROATE No components found for:  CBMZ  Current Medications: Current Outpatient Medications  Medication Sig Dispense Refill  . albuterol (VENTOLIN HFA) 108 (90 Base) MCG/ACT inhaler Inhale 2 puffs every 6 hours if needed for breathing 18 g 12  . [START ON 05/29/2019] amphetamine-dextroamphetamine (ADDERALL XR) 20 MG 24 hr capsule Take 1 capsule (20 mg total) by mouth 2 (two) times daily with breakfast and lunch. 60 capsule 0  . Dietary Management Product (ENLYTE) CAPS Take 1 capsule by mouth daily at 12 noon. May substitute Enbrace HR if covered  May get from [email or web address] if neither are  covered 30 capsule  11  . estradiol (ESTRACE) 1 MG tablet Take 1 mg by mouth daily.    [name] LORazepam (ATIVAN) 0.5 MG tablet Take 1 tablet (0.5 mg total) by mouth at bedtime. 30 tablet 1  . ondansetron (ZOFRAN ODT) 4 MG disintegrating tablet Take 1 tablet (4 mg total) by mouth every 8 (eight) hours as needed. 20 tablet 0  . polyethylene glycol (MIRALAX / GLYCOLAX) packet Take 17 g by mouth 3 (three) times daily as needed. 14 each 0  . promethazine (PHENERGAN) 25 MG suppository Place 1 suppository (25 mg total) rectally every 6 (six) hours as needed for nausea or vomiting. 12 each 0  . sertraline (ZOLOFT) 100 MG tablet Take 1.5 tablets (150 mg total) by mouth daily. 45 tablet 1   No current facility-administered medications for this visit.     Psychiatric Specialty Exam: Review of Systems  Psychiatric/Behavioral: The patient is nervous/anxious.   All other systems reviewed and are negative.   Last menstrual period 10/08/2012.There is no height or weight on file to calculate BMI.  General Appearance: NA  Eye Contact:  NA  Speech:  Clear and Coherent and Normal Rate  Volume:  Normal  Mood:  Anxious   Affect:  NA  Thought Process:  Goal Directed and Linear  Orientation:  Full (Time, Place, and Person)  Thought Content: Logical   Suicidal Thoughts:  No  Homicidal Thoughts:  No  Memory:  Immediate;   Good Recent;   Good Remote;   Good  Judgement:  Good  Insight:  Good  Psychomotor Activity:  NA  Concentration:  Concentration: Fair  Recall:  Good  Fund of Knowledge: Good  Language: Good  Akathisia:  Negative  Handed:  Right  AIMS (if indicated): not done  Assets:  Communication Skills Desire for Improvement Financial Resources/Insurance Housing Resilience Social Support Talents/Skills  ADL's:  Intact  Cognition: WNL  Sleep:  Good   Screenings: AIMS     Admission (Discharged) from OP Visit from 03/05/2019 in [place] [facility]  AIMS Total Score  0    AUDIT     Admission (Discharged) from OP Visit from 03/05/2019 in [place] [facility]  Alcohol Use Disorder Identification Test Final Score (AUDIT)  4       Assessment and Plan: 53yomarriedfemale with MDD/GAD and ADD.She was doing well on sertraline 200 mg and did not need lorazepam for anxiety often. Kingston was also taking Adderall XR (last dose 40 mg) for ADD. 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 Within 4 days she became withdrawn, not sleeping well, started to hallucinate. She was brought to [facility] by her husband on 28.4.20 and was admitted with dx of MDD severe. Adderall and Zoloft were held while risperidone 2 mg was added. She improved to the extent that she was ready for dc home on 12.11.20. Since dc she continue to feel "spacy", forgetful, has problems with focusing, task completion but subjectively feels that her mentation is becoming clearer. She denies having hallucinations and her sleep hasimproved.Sheis slowly improving: no hallucinations, sleep is better but still feels depressed, anxious and struggling with short term  memory/concentration despite being back on Adderall over past few weeks. We restarted sertraline- now on 100 mg for 3 weeks, discontinued risperidone. She also takes lorazepam 0.25 mg in am and 0.5 mg at HS (in addition to melatonin).Shehas beenon FMLAsince 12/4. She was hesitant and anxious about returning to work because of ongoing problems with  memory. She was not only forgetful but would at times feel disoriented - e.g. finds self in places not realizing where she is and how she got there. There is no family history of dementia but these symptoms are worrisome as they are not entirely explainable by ADHD. She planed to see a neurologist as well. Over past two weeks she (and husband) noticed improvement in cognition: better focusing, no overt memory problems. She now feels ready to try to go back to work and put off plan to see a neurology  Dx:MDD recurrent moderate;ADD; GAD; R/O Neurocognitive disorder  Plan:Continue sertraline to 150 mg daily, lorazepam 0.5 mgat HS,and AdderallXR change to 20 mg bid. Wewill release her back to work on Monday 2/15 with recommendation to work 1/2 time for next two weeks and meet with her again before then. The plan was discussed with patientand [id number] had an opportunity to ask questions and these were all answered. I spend25 minutes inphone consultation with the patient    Stephanie Acre, MD 05/14/2019, 9:42 AM

## 2019-05-17 MED FILL — ESTRADIOL 1 MG TABLET: 1 | 90 days supply | Qty: 90 | Fill #0

## 2019-05-18 ENCOUNTER — Other Ambulatory Visit (HOSPITAL_COMMUNITY): Payer: Self-pay | Admitting: Psychiatry

## 2019-05-18 ENCOUNTER — Telehealth (HOSPITAL_COMMUNITY): Payer: Self-pay

## 2019-05-18 ENCOUNTER — Ambulatory Visit (HOSPITAL_COMMUNITY): Payer: 59 | Admitting: Psychology

## 2019-05-18 DIAGNOSIS — G5622 Lesion of ulnar nerve, left upper limb: Secondary | ICD-10-CM | POA: Diagnosis not present

## 2019-05-18 MED ORDER — AMPHETAMINE-DEXTROAMPHET ER 25 MG PO CP24: 25.0000 mg | ORAL_CAPSULE | Freq: Two times a day (BID) | ORAL | 0 refills | Status: DC

## 2019-05-18 MED FILL — ADDERALL XR 25 MG CAPSULE: 25 | 30 days supply | Qty: 60 | Fill #0

## 2019-05-18 NOTE — Telephone Encounter (Signed)
Patient called regarding her FMLA paperwork. I notified her that I had already faxed it over to [employer]. She stated during the voicemail message that she's having trouble with forgetting things and focusing. She requested an increase in her Adderall or another alternative. Please review and advise. Thank you.

## 2019-05-18 NOTE — Telephone Encounter (Signed)
Notified patient that her Adderall has been sent in to [facility] in [place]. She stated that's incorrect and would like it resent to [facility] in [place]. Thank you.

## 2019-05-18 NOTE — Telephone Encounter (Signed)
I did increase dose for her new Rx to 25 mg bid.

## 2019-05-18 NOTE — Telephone Encounter (Signed)
Corrected

## 2019-05-19 NOTE — Telephone Encounter (Signed)
Notified patient.

## 2019-05-24 ENCOUNTER — Other Ambulatory Visit: Payer: Self-pay | Admitting: Internal Medicine

## 2019-05-24 ENCOUNTER — Ambulatory Visit
Admission: RE | Admit: 2019-05-24 | Discharge: 2019-05-24 | Disposition: A | Discharge status: Home / Self Care | Payer: 59 | Source: Ambulatory Visit | Attending: Internal Medicine | Admitting: Internal Medicine

## 2019-05-24 ENCOUNTER — Other Ambulatory Visit: Payer: Self-pay

## 2019-05-24 DIAGNOSIS — R921 Mammographic calcification found on diagnostic imaging of breast: Secondary | ICD-10-CM | POA: Diagnosis not present

## 2019-05-25 ENCOUNTER — Ambulatory Visit (HOSPITAL_COMMUNITY): Payer: 59 | Admitting: Psychology

## 2019-05-26 ENCOUNTER — Telehealth (HOSPITAL_COMMUNITY): Payer: Self-pay | Admitting: *Deleted

## 2019-05-26 NOTE — Telephone Encounter (Signed)
Nurse received a call from [facility] confirming that referral was accepted and that Dr. Is booked out until June for new pts. Writer confirmed this would be ok. They will call pt for appointment.

## 2019-05-26 NOTE — Telephone Encounter (Signed)
I hope she goes because she now has second thoughts about seeing neurologist.

## 2019-05-27 ENCOUNTER — Other Ambulatory Visit: Payer: Self-pay

## 2019-05-27 ENCOUNTER — Ambulatory Visit (INDEPENDENT_AMBULATORY_CARE_PROVIDER_SITE_OTHER): Payer: 59 | Admitting: Psychiatry

## 2019-05-27 DIAGNOSIS — F330 Major depressive disorder, recurrent, mild: Secondary | ICD-10-CM

## 2019-05-27 DIAGNOSIS — F988 Other specified behavioral and emotional disorders with onset usually occurring in childhood and adolescence: Secondary | ICD-10-CM | POA: Diagnosis not present

## 2019-05-27 DIAGNOSIS — F411 Generalized anxiety disorder: Secondary | ICD-10-CM

## 2019-05-27 MED ORDER — SERTRALINE HCL 100 MG PO TABS: 150.0000 mg | ORAL_TABLET | Freq: Every day | ORAL | 2 refills | Status: DC

## 2019-05-27 MED FILL — SERTRALINE HCL 100 MG TAB: 100 | 30 days supply | Qty: 45 | Fill #0

## 2019-05-27 NOTE — Progress Notes (Signed)
[place] MD/PA/NP OP Progress Note  05/27/2019 4:08 PM Kathleen Key  MRN:  [id number] Interview was conducted by phone and I verified that I was speaking with the correct person using two identifiers. I discussed the limitations of evaluation and management by telemedicine and  the availability of in person appointments. Patient expressed understanding and agreed to proceed.  Chief Complaint: "I feel better and I think I can start working full time".  HPI: 53yomarriedfemale with MDD/GAD and ADD.She was doing well on sertraline 200 mg and did not need lorazepam for anxiety often. Shilpa was also taking Adderall XR (last dose 40 mg) for ADD. 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 Within 4 days she became withdrawn, not sleeping well, started to hallucinate. She was brought to [facility] by her husband on 84.4.20 and was admitted with dx of MDD severe. Adderall and Zoloft were held while risperidone 2 mg was added. She improved to the extent that she was ready for dc home on 12.11.20. Since dc she continue to feel "spacy", forgetful, has problems with focusing, task completion but subjectively feels that her mentation is becoming clearer. She denies having hallucinations and her sleep hasimproved.Sheis slowly improving: no hallucinations, sleep is better but still feels depressed, anxious and struggling withshort term memory/concentration despite being back on Adderall over past few weeks. We restarted sertraline, discontinued risperidone. She also takes lorazepam but now only 0.5 mg at HS (in addition to melatonin).Shehas beenon FMLAsince 12/4 but returned to work 1/2 time two weeks ago. She reports doing well on the job and feels she can start full time now. She was initially hesitant and anxious about returning to work because of prolonged problems with memory. She was not only forgetful but would at times feel disoriented - e.g. finds self in places not realizing where she is and how  she got there. There is no family history of dementia but these symptoms are worrisome as they are not entirely explainable by ADHD. She planed to see a neurologist as well. Over past month, however, she (and husband) noticed improvement in cognition: better focusing, no overt memory problems. She still would like to try a slightly higher dose of Adderall.    Visit Diagnosis:    ICD-10-CM   1. GAD (generalized anxiety disorder)  F41.1   2. ADD (attention deficit disorder) without hyperactivity  F98.8   3. Major depressive disorder, recurrent episode, mild (HCC)  F33.0     Past Psychiatric History: Please see intake H&P.  Past Medical History:  Past Medical History:  Diagnosis Date  . Asthma   . Fibroids    uterine  . IBS (irritable bowel syndrome)   . Pulmonary sarcoidosis ([place])   . Shortness of breath dyspnea     Past Surgical History:  Procedure Laterality Date  . ABDOMINAL HYSTERECTOMY N/A 10/13/2012   Procedure: HYSTERECTOMY ABDOMINAL;  Surgeon: Jonnie Kind, MD;  Location: [facility];  Service: Gynecology;  Laterality: N/A;  . BILATERAL SALPINGECTOMY Bilateral 10/13/2012   Procedure: BILATERAL SALPINGECTOMY;  Surgeon: Jonnie Kind, MD;  Location: [facility];  Service: Gynecology;  Laterality: Bilateral;  . BREAST BIOPSY Left 05/2015  . BREAST EXCISIONAL BIOPSY Left 05/2015  . BREAST LUMPECTOMY WITH RADIOACTIVE SEED LOCALIZATION Left 07/28/2015   Procedure: LEFT BREAST LUMPECTOMY WITH RADIOACTIVE SEED LOCALIZATION;  Surgeon: Autumn Messing III, MD;  Location: [place];  Service: General;  Laterality: Left;  . WISDOM TOOTH EXTRACTION     Dr. Georgina Snell  office-[facility], [place], [place]    Family Psychiatric History: None.  Family History:  Family History  Problem Relation Age of Onset  . Heart disease Mother        leaky valve  . Asthma Mother   . Hypertension Father        pacemaker  . Stroke Father   . Heart disease Father        pacemaker  .  Fibromyalgia Sister   . Neuropathy Sister     Social History:  Social History   Socioeconomic History  . Marital status: Married    Spouse name: Not on file  . Number of children: Not on file  . Years of education: Not on file  . Highest education level: Not on file  Occupational History  . Not on file  Tobacco Use  . Smoking status: Never Smoker  . Smokeless tobacco: Never Used  Substance and Sexual Activity  . Alcohol use: Yes    Comment: occassional  . Drug use: No  . Sexual activity: Yes    Birth control/protection: Surgical  Other Topics Concern  . Not on file  Social History Narrative  . Not on file   Social Determinants of Health   Financial Resource Strain:   . Difficulty of Paying Living Expenses: Not on file  Food Insecurity:   . Worried About Charity fundraiser in the Last Year: Not on file  . Ran Out of Food in the Last Year: Not on file  Transportation Needs:   . Lack of Transportation (Medical): Not on file  . Lack of Transportation (Non-Medical): Not on file  Physical Activity:   . Days of Exercise per Week: Not on file  . Minutes of Exercise per Session: Not on file  Stress:   . Feeling of Stress : Not on file  Social Connections:   . Frequency of Communication with Friends and Family: Not on file  . Frequency of Social Gatherings with Friends and Family: Not on file  . Attends Religious Services: Not on file  . Active Member of Clubs or Organizations: Not on file  . Attends Archivist Meetings: Not on file  . Marital Status: Not on file    Allergies:  Allergies  Allergen Reactions  . Dilaudid [Hydromorphone Hcl] Itching  . Percocet [Oxycodone-Acetaminophen] Itching  . Excedrin Back & [Acetaminophen-Aspirin Buffered] Other (See Comments)    SUGAR-SPIKE, NERVOUSNESS    Metabolic Disorder Labs: Lab Results  Component Value Date   HGBA1C 5.6 03/07/2019   MPG 114.02 03/07/2019   No results found for: PROLACTIN Lab Results   Component Value Date   CHOL 194 03/07/2019   TRIG 78 03/07/2019   HDL 74 03/07/2019   CHOLHDL 2.6 03/07/2019   VLDL 16 03/07/2019   LDLCALC 104 (H) 03/07/2019   Lab Results  Component Value Date   TSH 2.368 03/07/2019    Therapeutic Level Labs: No results found for: LITHIUM No results found for: VALPROATE No components found for:  CBMZ  Current Medications: Current Outpatient Medications  Medication Sig Dispense Refill  . albuterol (VENTOLIN HFA) 108 (90 Base) MCG/ACT inhaler Inhale 2 puffs every 6 hours if needed for breathing 18 g 12  . amphetamine-dextroamphetamine (ADDERALL XR) 25 MG 24 hr capsule Take 1 capsule by mouth 2 (two) times daily with breakfast and lunch. 60 capsule 0  . Dietary Management Product (ENLYTE) CAPS Take 1 capsule by mouth daily at 12 noon. May substitute Enbrace HR if covered  May get from [facility].com if neither are  covered 30 capsule 11  . estradiol (ESTRACE) 1 MG tablet Take 1 mg by mouth daily.    [name] LORazepam (ATIVAN) 0.5 MG tablet Take 1 tablet (0.5 mg total) by mouth at bedtime. 30 tablet 1  . ondansetron (ZOFRAN ODT) 4 MG disintegrating tablet Take 1 tablet (4 mg total) by mouth every 8 (eight) hours as needed. 20 tablet 0  . polyethylene glycol (MIRALAX / GLYCOLAX) packet Take 17 g by mouth 3 (three) times daily as needed. 14 each 0  . promethazine (PHENERGAN) 25 MG suppository Place 1 suppository (25 mg total) rectally every 6 (six) hours as needed for nausea or vomiting. 12 each 0  . [START ON 06/17/2019] sertraline (ZOLOFT) 100 MG tablet Take 1.5 tablets (150 mg total) by mouth daily. 45 tablet 2   No current facility-administered medications for this visit.     Psychiatric Specialty Exam: Review of Systems  Psychiatric/Behavioral: The patient is nervous/anxious.   All other systems reviewed and are negative.   Last menstrual period 10/08/2012.There is no height or weight on file to calculate BMI.  General Appearance: NA  Eye  Contact:  NA  Speech:  Clear and Coherent and Normal Rate  Volume:  Normal  Mood:  Less anxious.  Affect:  NA  Thought Process:  Goal Directed and Linear  Orientation:  Full (Time, Place, and Person)  Thought Content: Logical   Suicidal Thoughts:  No  Homicidal Thoughts:  No  Memory:  Immediate;   Good Recent;   Good Remote;   Good  Judgement:  Good  Insight:  Good  Psychomotor Activity:  NA  Concentration:  Concentration: Fair  Recall:  Good  Fund of Knowledge: Good  Language: Good  Akathisia:  Negative  Handed:  Right  AIMS (if indicated): not done  Assets:  Communication Skills Desire for Improvement Financial Resources/Insurance Housing Intimacy Social Support Talents/Skills  ADL's:  Intact  Cognition: WNL  Sleep:  Good   Screenings: AIMS     Admission (Discharged) from OP Visit from 03/05/2019 in [place] [facility]  AIMS Total Score  0    AUDIT     Admission (Discharged) from OP Visit from 03/05/2019 in [place] [facility]  Alcohol Use Disorder Identification Test Final Score (AUDIT)  4       Assessment and Plan: 53yomarriedfemale with MDD/GAD and ADD.She was doing well on sertraline 200 mg and did not need lorazepam for anxiety often. Nautia was also taking Adderall XR (last dose 40 mg) for ADD. 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 Within 4 days she became withdrawn, not sleeping well, started to hallucinate. She was brought to [facility] by her husband on 21.4.20 and was admitted with dx of MDD severe. Adderall and Zoloft were held while risperidone 2 mg was added. She improved to the extent that she was ready for dc home on 12.11.20. Since dc she continue to feel "spacy", forgetful, has problems with focusing, task completion but subjectively feels that her mentation is becoming clearer. She denies having hallucinations and her sleep hasimproved.Sheis slowly improving: no hallucinations,  sleep is better but still feels depressed, anxious and struggling withshort term memory/concentration despite being back on Adderall over past few weeks. We restarted sertraline, discontinued risperidone. She also takes lorazepam but now only 0.5 mg at HS (in addition to melatonin).Shehas beenon FMLAsince 12/4 but returned to work 1/2 time two weeks ago.  She reports doing well on the job and feels she can start full time now. She was initially hesitant and anxious about returning to work because of prolonged problems with memory. She was not only forgetful but would at times feel disoriented - e.g. finds self in places not realizing where she is and how she got there. There is no family history of dementia but these symptoms are worrisome as they are not entirely explainable by ADHD. She planed to see a neurologist as well. Over past month, however, she (and husband) noticed improvement in cognition: better focusing, no overt memory problems. She still would like to try a slightly higher dose of Adderall.   Dx:MDD recurrent mild;ADD; GAD; R/O Neurocognitive disorder  Plan:Continuesertraline to 150 mg daily, lorazepam 0.5 mgat HS,and AdderallXRchange to 25 mg bid. Bartholome Bill she can start working full time now.  Next appointment in one month. The plan was discussed with patientand [id number] had an opportunity to ask questions and these were all answered. I spend25 minutes inphone consultation with the patient.    Stephanie Acre, MD 05/27/2019, 4:08 PM

## 2019-05-31 HISTORY — PX: ULNAR NERVE TRANSPOSITION: SHX2595

## 2019-06-01 ENCOUNTER — Ambulatory Visit (HOSPITAL_COMMUNITY): Payer: 59 | Admitting: Psychology

## 2019-06-23 ENCOUNTER — Other Ambulatory Visit: Payer: Self-pay

## 2019-06-23 ENCOUNTER — Ambulatory Visit (INDEPENDENT_AMBULATORY_CARE_PROVIDER_SITE_OTHER): Payer: 59 | Admitting: Psychiatry

## 2019-06-23 DIAGNOSIS — F330 Major depressive disorder, recurrent, mild: Secondary | ICD-10-CM

## 2019-06-23 DIAGNOSIS — F988 Other specified behavioral and emotional disorders with onset usually occurring in childhood and adolescence: Secondary | ICD-10-CM

## 2019-06-23 DIAGNOSIS — F411 Generalized anxiety disorder: Secondary | ICD-10-CM

## 2019-06-23 MED ORDER — AMPHETAMINE-DEXTROAMPHET ER 30 MG PO CP24: 30.0000 mg | ORAL_CAPSULE | Freq: Two times a day (BID) | ORAL | 0 refills | Status: DC

## 2019-06-23 MED ORDER — LORAZEPAM 0.5 MG PO TABS: 0.5000 mg | ORAL_TABLET | Freq: Every day | ORAL | 2 refills | Status: DC

## 2019-06-23 MED FILL — ADDERALL XR 30 MG CAP SA: 30 | 30 days supply | Qty: 60 | Fill #0

## 2019-06-23 MED FILL — LORazepam 0.5 MG TABS: 0.5 | 30 days supply | Qty: 30 | Fill #0

## 2019-06-23 NOTE — Progress Notes (Signed)
BH MD/PA/NP OP Progress Note  06/23/2019 4:12 PM Kathleen Key  MRN:  [id number] Interview was conducted by phone and I verified that I was speaking with the correct person using two identifiers. I discussed the limitations of evaluation and management by telemedicine and  the availability of in person appointments. Patient expressed understanding and agreed to proceed.  Chief Complaint: Some residual distractibility.  HPI: 53yomarriedfemale with MDD/GAD and ADD.She was doing well on sertraline 200 mg and did not need lorazepam for anxiety often. Kathleen Key was also taking Adderall XR (last dose 40 mg) for ADD. 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 Within 4 days she became withdrawn, not sleeping well, started to hallucinate. She was brought to [facility] by her husband on 49.4.20 and was admitted with dx of MDD severe. Adderall and Zoloft were held while risperidone 2 mg was added. She improved to the extent that she was ready for dc home on 12.11.20. Since dc she continue to feel "spacy", forgetful, has problems with focusing, task completion but subjectively feels that her mentation is becoming clearer. She denies having hallucinations and her sleep hasimproved.Sheas improvied: no hallucinations, sleep is better, appetite normal, depression and anxiety minimal. We restarted sertraline, Adderall, discontinued risperidone. She also takes lorazepam but now only 0.5 mg at HS (in addition to melatonin).Shehas beenback at work full time for a month now and reports doing well on the job. She was initially hesitant and anxious about returning to work because of prolonged problems with memory.Shewasnot only forgetful but would at times feel disoriented. There is no family history of dementia. She planedto see a neurologist but over past two months she (and husband) noticed improvement in cognition: better focusing, no overt memory problems. She still would like to try a slightly higher  dose of Adderall as some residual focusing problems/forgetfulness persist.    Visit Diagnosis:    ICD-10-CM   1. ADD (attention deficit disorder) without hyperactivity  F98.8   2. GAD (generalized anxiety disorder)  F41.1   3. Major depressive disorder, recurrent episode, mild (HCC)  F33.0     Past Psychiatric History: Please see intake H&P.  Past Medical History:  Past Medical History:  Diagnosis Date  . Asthma   . Fibroids    uterine  . IBS (irritable bowel syndrome)   . Pulmonary sarcoidosis ([place])   . Shortness of breath dyspnea     Past Surgical History:  Procedure Laterality Date  . ABDOMINAL HYSTERECTOMY N/A 10/13/2012   Procedure: HYSTERECTOMY ABDOMINAL;  Surgeon: Jonnie Kind, MD;  Location: [facility];  Service: Gynecology;  Laterality: N/A;  . BILATERAL SALPINGECTOMY Bilateral 10/13/2012   Procedure: BILATERAL SALPINGECTOMY;  Surgeon: Jonnie Kind, MD;  Location: [facility];  Service: Gynecology;  Laterality: Bilateral;  . BREAST BIOPSY Left 05/2015  . BREAST EXCISIONAL BIOPSY Left 05/2015  . BREAST LUMPECTOMY WITH RADIOACTIVE SEED LOCALIZATION Left 07/28/2015   Procedure: LEFT BREAST LUMPECTOMY WITH RADIOACTIVE SEED LOCALIZATION;  Surgeon: Autumn Messing III, MD;  Location: [place];  Service: General;  Laterality: Left;  . WISDOM TOOTH EXTRACTION     Dr. Georgina Snell office-[facility], [place], [place]    Family Psychiatric History: None.  Family History:  Family History  Problem Relation Age of Onset  . Heart disease Mother        leaky valve  . Asthma Mother   . Hypertension Father        pacemaker  . Stroke Father   .  Heart disease Father        pacemaker  . Fibromyalgia Sister   . Neuropathy Sister     Social History:  Social History   Socioeconomic History  . Marital status: Married    Spouse name: Not on file  . Number of children: Not on file  . Years of education: Not on file  . Highest education level: Not on file   Occupational History  . Not on file  Tobacco Use  . Smoking status: Never Smoker  . Smokeless tobacco: Never Used  Substance and Sexual Activity  . Alcohol use: Yes    Comment: occassional  . Drug use: No  . Sexual activity: Yes    Birth control/protection: Surgical  Other Topics Concern  . Not on file  Social History Narrative  . Not on file   Social Determinants of Health   Financial Resource Strain:   . Difficulty of Paying Living Expenses:   Food Insecurity:   . Worried About Charity fundraiser in the Last Year:   . Arboriculturist in the Last Year:   Transportation Needs:   . Film/video editor (Medical):   [name] Lack of Transportation (Non-Medical):   Physical Activity:   . Days of Exercise per Week:   . Minutes of Exercise per Session:   Stress:   . Feeling of Stress :   Social Connections:   . Frequency of Communication with Friends and Family:   . Frequency of Social Gatherings with Friends and Family:   . Attends Religious Services:   . Active Member of Clubs or Organizations:   . Attends Archivist Meetings:   [name] Marital Status:     Allergies:  Allergies  Allergen Reactions  . Dilaudid [Hydromorphone Hcl] Itching  . Percocet [Oxycodone-Acetaminophen] Itching  . Excedrin Back & [Acetaminophen-Aspirin Buffered] Other (See Comments)    SUGAR-SPIKE, NERVOUSNESS    Metabolic Disorder Labs: Lab Results  Component Value Date   HGBA1C 5.6 03/07/2019   MPG 114.02 03/07/2019   No results found for: PROLACTIN Lab Results  Component Value Date   CHOL 194 03/07/2019   TRIG 78 03/07/2019   HDL 74 03/07/2019   CHOLHDL 2.6 03/07/2019   VLDL 16 03/07/2019   LDLCALC 104 (H) 03/07/2019   Lab Results  Component Value Date   TSH 2.368 03/07/2019    Therapeutic Level Labs: No results found for: LITHIUM No results found for: VALPROATE No components found for:  CBMZ  Current Medications: Current Outpatient Medications  Medication Sig  Dispense Refill  . albuterol (VENTOLIN HFA) 108 (90 Base) MCG/ACT inhaler Inhale 2 puffs every 6 hours if needed for breathing 18 g 12  . [START ON 08/23/2019] amphetamine-dextroamphetamine (ADDERALL XR) 30 MG 24 hr capsule Take 1 capsule (30 mg total) by mouth 2 (two) times daily with breakfast and lunch. 60 capsule 0  . [START ON 07/24/2019] amphetamine-dextroamphetamine (ADDERALL XR) 30 MG 24 hr capsule Take 1 capsule (30 mg total) by mouth 2 (two) times daily with breakfast and lunch. 60 capsule 0  . amphetamine-dextroamphetamine (ADDERALL XR) 30 MG 24 hr capsule Take 1 capsule (30 mg total) by mouth 2 (two) times daily with breakfast and lunch. 60 capsule 0  . Dietary Management Product (ENLYTE) CAPS Take 1 capsule by mouth daily at 12 noon. May substitute Enbrace HR if covered  May get from [email or web address] if neither are  covered 30 capsule 11  . estradiol (ESTRACE) 1 MG tablet Take 1  mg by mouth daily.    [name] LORazepam (ATIVAN) 0.5 MG tablet Take 1 tablet (0.5 mg total) by mouth at bedtime. 30 tablet 2  . ondansetron (ZOFRAN ODT) 4 MG disintegrating tablet Take 1 tablet (4 mg total) by mouth every 8 (eight) hours as needed. 20 tablet 0  . polyethylene glycol (MIRALAX / GLYCOLAX) packet Take 17 g by mouth 3 (three) times daily as needed. 14 each 0  . promethazine (PHENERGAN) 25 MG suppository Place 1 suppository (25 mg total) rectally every 6 (six) hours as needed for nausea or vomiting. 12 each 0  . sertraline (ZOLOFT) 100 MG tablet Take 1.5 tablets (150 mg total) by mouth daily. 45 tablet 2   No current facility-administered medications for this visit.     Psychiatric Specialty Exam: Review of Systems  Psychiatric/Behavioral: Positive for decreased concentration.  All other systems reviewed and are negative.   Last menstrual period 10/08/2012.There is no height or weight on file to calculate BMI.  General Appearance: NA  Eye Contact:  NA  Speech:  Clear and Coherent and Normal Rate   Volume:  Normal  Mood:  Much improved.  Affect:  NA  Thought Process:  Goal Directed and Linear  Orientation:  Full (Time, Place, and Person)  Thought Content: Logical   Suicidal Thoughts:  No  Homicidal Thoughts:  No  Memory:  Immediate;   Fair Recent;   Good Remote;   Good  Judgement:  Good  Insight:  Good  Psychomotor Activity:  NA  Concentration:  Concentration: Fair  Recall:  [place] of Knowledge: Good  Language: Good  Akathisia:  Negative  Handed:  Right  AIMS (if indicated): not done  Assets:  Communication Skills Desire for Improvement Financial Resources/Insurance Housing Resilience Social Support Talents/Skills  ADL's:  Intact  Cognition: WNL  Sleep:  Good   Screenings: AIMS     Admission (Discharged) from OP Visit from 03/05/2019 in [place] [facility]B  AIMS Total Score  0    AUDIT     Admission (Discharged) from OP Visit from 03/05/2019 in [place] [facility]  Alcohol Use Disorder Identification Test Final Score (AUDIT)  4       Assessment and Plan: 53yomarriedfemale with MDD/GAD and ADD.She was doing well on sertraline 200 mg and did not need lorazepam for anxiety often. Kathleen Key was also taking Adderall XR (last dose 40 mg) for ADD. 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 Within 4 days she became withdrawn, not sleeping well, started to hallucinate. She was brought to [facility] by her husband on 78.4.20 and was admitted with dx of MDD severe. Adderall and Zoloft were held while risperidone 2 mg was added. She improved to the extent that she was ready for dc home on 12.11.20. Since dc she continue to feel "spacy", forgetful, has problems with focusing, task completion but subjectively feels that her mentation is becoming clearer. She denies having hallucinations and her sleep hasimproved.Sheas improvied: no hallucinations, sleep is better, appetite normal, depression and anxiety  minimal. We restarted sertraline, Adderall, discontinued risperidone. She also takes lorazepam but now only 0.5 mg at HS (in addition to melatonin).Shehas beenback at work full time for a month now and reports doing well on the job. She was initially hesitant and anxious about returning to work because of prolonged problems with memory.Shewasnot only forgetful but would at times feel disoriented. There is no family history of dementia. She planedto see  a neurologist but over past two months she (and husband) noticed improvement in cognition: better focusing, no overt memory problems. She still would like to try a slightly higher dose of Adderall as some residual focusing problems/forgetfulness persist.   Dx:MDD recurrent mild;ADD; GAD;R/ONeurocognitive disorder  Plan:Continuesertraline to 150 mg daily, lorazepam 0.5 mgat HS,and AdderallXRchange to 30 mg bid. Next appointment in 6 weeks. The plan was discussed with patientwho had an opportunity to ask questions and these were all answered. I spend20 minutes inphone consultation with the patient.    Stephanie Acre, MD 06/23/2019, 4:12 PM

## 2019-06-30 DIAGNOSIS — G5622 Lesion of ulnar nerve, left upper limb: Secondary | ICD-10-CM | POA: Diagnosis not present

## 2019-06-30 MED FILL — HYDROCODON-APAP 10-325: 10-325 | 5 days supply | Qty: 20 | Fill #0

## 2019-07-09 ENCOUNTER — Ambulatory Visit: Payer: 59 | Attending: Internal Medicine

## 2019-07-09 DIAGNOSIS — Z23 Encounter for immunization: Secondary | ICD-10-CM

## 2019-07-09 NOTE — Progress Notes (Signed)
   Covid-19 Vaccination Clinic  Name:  Kathleen Key    MRN: [id number] DOB: 05-Oct-1965  07/09/2019  [name] was observed post Covid-19 immunization for 15 minutes without incident. She was provided with Vaccine Information Sheet and instruction to access the V-Safe system.   [name] was instructed to call 911 with any severe reactions post vaccine: [name] Difficulty breathing  . Swelling of face and throat  . A fast heartbeat  . A bad rash all over body  . Dizziness and weakness   Immunizations Administered    Name Date Dose VIS Date Route   Moderna COVID-19 Vaccine 07/09/2019 12:19 PM 0.5 mL 03/02/2019 Intramuscular   Manufacturer: Moderna   Lot: [id number]   [id number]

## 2019-07-15 DIAGNOSIS — M25522 Pain in left elbow: Secondary | ICD-10-CM | POA: Diagnosis not present

## 2019-07-19 MED FILL — SERTRALINE HCL 100 MG TAB: 100 | 30 days supply | Qty: 45 | Fill #0

## 2019-07-20 DIAGNOSIS — M25522 Pain in left elbow: Secondary | ICD-10-CM | POA: Diagnosis not present

## 2019-07-27 DIAGNOSIS — M25522 Pain in left elbow: Secondary | ICD-10-CM | POA: Diagnosis not present

## 2019-08-03 DIAGNOSIS — M25522 Pain in left elbow: Secondary | ICD-10-CM | POA: Diagnosis not present

## 2019-08-04 ENCOUNTER — Other Ambulatory Visit: Payer: Self-pay

## 2019-08-04 ENCOUNTER — Telehealth (INDEPENDENT_AMBULATORY_CARE_PROVIDER_SITE_OTHER): Payer: 59 | Admitting: Psychiatry

## 2019-08-04 DIAGNOSIS — F330 Major depressive disorder, recurrent, mild: Secondary | ICD-10-CM

## 2019-08-04 DIAGNOSIS — F411 Generalized anxiety disorder: Secondary | ICD-10-CM | POA: Diagnosis not present

## 2019-08-04 DIAGNOSIS — F988 Other specified behavioral and emotional disorders with onset usually occurring in childhood and adolescence: Secondary | ICD-10-CM

## 2019-08-04 MED ORDER — MYDAYIS 50 MG PO CP24: 50.0000 mg | ORAL_CAPSULE | Freq: Every day | ORAL | 0 refills | Status: DC

## 2019-08-04 MED ORDER — LORAZEPAM 1 MG PO TABS: 1.0000 mg | ORAL_TABLET | Freq: Every evening | ORAL | 2 refills | Status: DC | PRN

## 2019-08-04 MED FILL — MYDAYIS ER 50 MG CAPSULE: 50 | 30 days supply | Qty: 30 | Fill #0

## 2019-08-04 MED FILL — LORAZEPAM 1 MG TABS: 1 | 30 days supply | Qty: 30 | Fill #0

## 2019-08-04 NOTE — Progress Notes (Signed)
[place] MD/PA/NP OP Progress Note  08/04/2019 4:17 PM Kathleen Key  MRN:  [id number] Interview was conducted by phone and I verified that I was speaking with the correct person using two identifiers. I discussed the limitations of evaluation and management by telemedicine and  the availability of in person appointments. Patient expressed understanding and agreed to proceed.  Chief Complaint: Lack of focus, sleep problems.  HPI: 54yomarriedfemale with MDD/GAD and ADD.She was doing well on sertraline 200 mg and did not need lorazepam for anxiety often. Kathleen Key was also taking Adderall XR (last dose 40 mg) for ADD. 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 In December 2020 she was admitted to [facility] with dx of MDD severe. Adderall and Zoloft were held while risperidone 2 mg was added. She improved to the extent that she was ready for dc home on 12.11.20. Shehas improved: no hallucinations, sleep is better, appetite normal, depression and anxiety minimal. We restarted sertraline, Adderall, discontinued risperidone. She also takes lorazepambut now only0.5 mg at HS (in addition to melatonin).Shehas beenback at work full time for a month now and reports doing well on the job.She was initiallyhesitant and anxious about returning to work because ofprolongedproblems with memory.Shewasnot only forgetful but would at times feel disoriented. There is no family history of dementia. She planedto see a neurologist but over past two monthsshe (and husband) noticed improvement in cognition: better focusing, no overt memory problems. Shereports that she did better in the past on Mydayis and would like to switch to it from [place]. Some problems falling asleep persist.   Visit Diagnosis:    ICD-10-CM   1. ADD (attention deficit disorder) without hyperactivity  F98.8   2. GAD (generalized anxiety disorder)  F41.1   3. Major depressive disorder, recurrent episode, mild (HCC)  F33.0      Past Psychiatric History: Please see intake H&P.  Past Medical History:  Past Medical History:  Diagnosis Date  . Asthma   . Fibroids    uterine  . IBS (irritable bowel syndrome)   . Pulmonary sarcoidosis ([place])   . Shortness of breath dyspnea     Past Surgical History:  Procedure Laterality Date  . ABDOMINAL HYSTERECTOMY N/A 10/13/2012   Procedure: HYSTERECTOMY ABDOMINAL;  Surgeon: Jonnie Kind, MD;  Location: [facility];  Service: Gynecology;  Laterality: N/A;  . BILATERAL SALPINGECTOMY Bilateral 10/13/2012   Procedure: BILATERAL SALPINGECTOMY;  Surgeon: Jonnie Kind, MD;  Location: [facility];  Service: Gynecology;  Laterality: Bilateral;  . BREAST BIOPSY Left 05/2015  . BREAST EXCISIONAL BIOPSY Left 05/2015  . BREAST LUMPECTOMY WITH RADIOACTIVE SEED LOCALIZATION Left 07/28/2015   Procedure: LEFT BREAST LUMPECTOMY WITH RADIOACTIVE SEED LOCALIZATION;  Surgeon: Autumn Messing III, MD;  Location: [place];  Service: General;  Laterality: Left;  . WISDOM TOOTH EXTRACTION     Dr. Georgina Snell office-[facility], [place], [place]    Family Psychiatric History: None.  Family History:  Family History  Problem Relation Age of Onset  . Heart disease Mother        leaky valve  . Asthma Mother   . Hypertension Father        pacemaker  . Stroke Father   . Heart disease Father        pacemaker  . Fibromyalgia Sister   . Neuropathy Sister     Social History:  Social History   Socioeconomic History  . Marital status: Married    Spouse name: Not  on file  . Number of children: Not on file  . Years of education: Not on file  . Highest education level: Not on file  Occupational History  . Not on file  Tobacco Use  . Smoking status: Never Smoker  . Smokeless tobacco: Never Used  Substance and Sexual Activity  . Alcohol use: Yes    Comment: occassional  . Drug use: No  . Sexual activity: Yes    Birth control/protection: Surgical  Other Topics Concern  .  Not on file  Social History Narrative  . Not on file   Social Determinants of Health   Financial Resource Strain:   . Difficulty of Paying Living Expenses:   Food Insecurity:   . Worried About Charity fundraiser in the Last Year:   . Arboriculturist in the Last Year:   Transportation Needs:   . Film/video editor (Medical):   [name] Lack of Transportation (Non-Medical):   Physical Activity:   . Days of Exercise per Week:   . Minutes of Exercise per Session:   Stress:   . Feeling of Stress :   Social Connections:   . Frequency of Communication with Friends and Family:   . Frequency of Social Gatherings with Friends and Family:   . Attends Religious Services:   . Active Member of Clubs or Organizations:   . Attends Archivist Meetings:   [name] Marital Status:     Allergies:  Allergies  Allergen Reactions  . Dilaudid [Hydromorphone Hcl] Itching  . Percocet [Oxycodone-Acetaminophen] Itching  . Excedrin Back & [Acetaminophen-Aspirin Buffered] Other (See Comments)    SUGAR-SPIKE, NERVOUSNESS    Metabolic Disorder Labs: Lab Results  Component Value Date   HGBA1C 5.6 03/07/2019   MPG 114.02 03/07/2019   No results found for: PROLACTIN Lab Results  Component Value Date   CHOL 194 03/07/2019   TRIG 78 03/07/2019   HDL 74 03/07/2019   CHOLHDL 2.6 03/07/2019   VLDL 16 03/07/2019   LDLCALC 104 (H) 03/07/2019   Lab Results  Component Value Date   TSH 2.368 03/07/2019    Therapeutic Level Labs: No results found for: LITHIUM No results found for: VALPROATE No components found for:  CBMZ  Current Medications: Current Outpatient Medications  Medication Sig Dispense Refill  . albuterol (VENTOLIN HFA) 108 (90 Base) MCG/ACT inhaler Inhale 2 puffs every 6 hours if needed for breathing 18 g 12  . Amphet-Dextroamphet 3-Bead ER (MYDAYIS) 50 MG CP24 Take 50 mg by mouth daily. 30 capsule 0  . [START ON 09/05/2019] Amphet-Dextroamphet 3-Bead ER (MYDAYIS) 50 MG CP24 Take 50  mg by mouth daily. 30 capsule 0  . amphetamine-dextroamphetamine (ADDERALL XR) 30 MG 24 hr capsule Take 1 capsule (30 mg total) by mouth 2 (two) times daily with breakfast and lunch. 60 capsule 0  . Dietary Management Product (ENLYTE) CAPS Take 1 capsule by mouth daily at 12 noon. May substitute Enbrace HR if covered  May get from [email or web address] if neither are  covered 30 capsule 11  . estradiol (ESTRACE) 1 MG tablet Take 1 mg by mouth daily.    [name] LORazepam (ATIVAN) 1 MG tablet Take 1 tablet (1 mg total) by mouth at bedtime as needed for sleep. 30 tablet 2  . ondansetron (ZOFRAN ODT) 4 MG disintegrating tablet Take 1 tablet (4 mg total) by mouth every 8 (eight) hours as needed. 20 tablet 0  . polyethylene glycol (MIRALAX / GLYCOLAX) packet Take 17 g by mouth  3 (three) times daily as needed. 14 each 0  . promethazine (PHENERGAN) 25 MG suppository Place 1 suppository (25 mg total) rectally every 6 (six) hours as needed for nausea or vomiting. 12 each 0  . sertraline (ZOLOFT) 100 MG tablet Take 1.5 tablets (150 mg total) by mouth daily. 45 tablet 2   No current facility-administered medications for this visit.     Psychiatric Specialty Exam: Review of Systems  Psychiatric/Behavioral: Positive for decreased concentration and sleep disturbance.  All other systems reviewed and are negative.   Last menstrual period 10/08/2012.There is no height or weight on file to calculate BMI.  General Appearance: NA  Eye Contact:  NA  Speech:  Clear and Coherent and Normal Rate  Volume:  Normal  Mood:  Euthymic  Affect:  NA  Thought Process:  Goal Directed  Orientation:  Full (Time, Place, and Person)  Thought Content: Logical   Suicidal Thoughts:  No  Homicidal Thoughts:  No  Memory:  Immediate;   Good Recent;   Good Remote;   Good  Judgement:  Good  Insight:  Good  Psychomotor Activity:  NA  Concentration:  Concentration: Fair  Recall:  Good  Fund of Knowledge: Good  Language: Good  Akathisia:   Negative  Handed:  Right  AIMS (if indicated): not done  Assets:  Communication Skills Desire for Improvement Financial Resources/Insurance Housing Social Support Talents/Skills  ADL's:  Intact  Cognition: WNL  Sleep:  Fair   Screenings: AIMS     Admission (Discharged) from OP Visit from 03/05/2019 in [place] [facility]  AIMS Total Score  0    AUDIT     Admission (Discharged) from OP Visit from 03/05/2019 in [place] [facility]  Alcohol Use Disorder Identification Test Final Score (AUDIT)  4       Assessment and Plan: 54yomarriedfemale with MDD/GAD and ADD.She was doing well on sertraline 200 mg and did not need lorazepam for anxiety often. Ilisa was also taking Adderall XR (last dose 40 mg) for ADD. 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 In December 2020 she was admitted to [facility] with dx of MDD severe. Adderall and Zoloft were held while risperidone 2 mg was added. She improved to the extent that she was ready for dc home on 12.11.20. Shehas improved: no hallucinations, sleep is better, appetite normal, depression and anxiety minimal. We restarted sertraline, Adderall, discontinued risperidone. She also takes lorazepambut now only0.5 mg at HS (in addition to melatonin).Shehas beenback at work full time for a month now and reports doing well on the job.She was initiallyhesitant and anxious about returning to work because ofprolongedproblems with memory.Shewasnot only forgetful but would at times feel disoriented. There is no family history of dementia. She planedto see a neurologist but over past two monthsshe (and husband) noticed improvement in cognition: better focusing, no overt memory problems. Shereports that she did better in the past on Mydayis and would like to switch to it from [facility]. Some problems falling asleep persist.  Dx:MDD recurrent mild;ADD;  GAD;  Plan:Continuesertraline to 150 mg daily, increase lorazepam to 1 mg at HS,and change Adderall XR to Mydayis 50 mg. Next appointment in 2 months. The plan was discussed with patientwho had an opportunity to ask questions and these were all answered. I spend20 minutes inphone consultation with the patient.    Stephanie Acre, MD 08/04/2019, 4:17 PM

## 2019-08-12 DIAGNOSIS — M25522 Pain in left elbow: Secondary | ICD-10-CM | POA: Diagnosis not present

## 2019-08-19 MED FILL — ESTRADIOL 1 MG TABS: 1 | 90 days supply | Qty: 90 | Fill #1

## 2019-08-19 MED FILL — SERTRALINE HCL 100 MG TAB: 100 | 30 days supply | Qty: 45 | Fill #0

## 2019-08-31 DIAGNOSIS — M25522 Pain in left elbow: Secondary | ICD-10-CM | POA: Diagnosis not present

## 2019-09-02 ENCOUNTER — Telehealth (HOSPITAL_COMMUNITY): Payer: Self-pay | Admitting: *Deleted

## 2019-09-02 MED ORDER — MYDAYIS 50 MG PO CP24: 50.0000 mg | ORAL_CAPSULE | Freq: Every day | ORAL | 0 refills | Status: DC

## 2019-09-02 NOTE — Telephone Encounter (Signed)
As per patient request Medication called in to [facility].

## 2019-09-02 NOTE — Telephone Encounter (Signed)
Pt called requesting early refill of the MYDAYIS 50mg , which is written as not to refill until 6/6 which is a Sunday and pt is needing for work on Monday. Pt has an upcoming appointment with Dr. Montel Culver on 10/05/19. Please review.

## 2019-09-17 MED FILL — SERTRALINE HCL 100 MG TAB: 100 | 30 days supply | Qty: 45 | Fill #1

## 2019-09-23 ENCOUNTER — Other Ambulatory Visit (HOSPITAL_COMMUNITY): Payer: Self-pay | Admitting: Psychiatry

## 2019-09-23 ENCOUNTER — Telehealth (HOSPITAL_COMMUNITY): Payer: Self-pay | Admitting: *Deleted

## 2019-09-23 MED ORDER — SERTRALINE HCL 100 MG PO TABS: 200.0000 mg | ORAL_TABLET | Freq: Every day | ORAL | 2 refills | Status: DC

## 2019-09-23 MED FILL — AMOX-CLAV 875-125 MG TABLET: 875-125 | 7 days supply | Qty: 14 | Fill #0

## 2019-09-23 NOTE — Telephone Encounter (Signed)
Pt called to ask if if it's ok to take Sudafed with the current psych meds she's on. Pt also asking if Zoloft can either be increased or can something be added as an adjunctive therapy? Pt has an upcoming appointment on 10/05/19. Please review.

## 2019-09-23 NOTE — Telephone Encounter (Signed)
I increased dose of Zoloft to 200 mg and send it to [facility].

## 2019-10-01 DIAGNOSIS — N838 Other noninflammatory disorders of ovary, fallopian tube and broad ligament: Secondary | ICD-10-CM | POA: Diagnosis not present

## 2019-10-01 DIAGNOSIS — Z8262 Family history of osteoporosis: Secondary | ICD-10-CM | POA: Diagnosis not present

## 2019-10-01 DIAGNOSIS — F321 Major depressive disorder, single episode, moderate: Secondary | ICD-10-CM | POA: Diagnosis not present

## 2019-10-01 DIAGNOSIS — Z0000 Encounter for general adult medical examination without abnormal findings: Secondary | ICD-10-CM | POA: Diagnosis not present

## 2019-10-01 DIAGNOSIS — K219 Gastro-esophageal reflux disease without esophagitis: Secondary | ICD-10-CM | POA: Diagnosis not present

## 2019-10-01 DIAGNOSIS — E2839 Other primary ovarian failure: Secondary | ICD-10-CM | POA: Diagnosis not present

## 2019-10-01 DIAGNOSIS — J018 Other acute sinusitis: Secondary | ICD-10-CM | POA: Diagnosis not present

## 2019-10-05 ENCOUNTER — Telehealth (INDEPENDENT_AMBULATORY_CARE_PROVIDER_SITE_OTHER): Payer: 59 | Admitting: Psychiatry

## 2019-10-05 ENCOUNTER — Other Ambulatory Visit: Payer: Self-pay

## 2019-10-05 DIAGNOSIS — F330 Major depressive disorder, recurrent, mild: Secondary | ICD-10-CM | POA: Diagnosis not present

## 2019-10-05 DIAGNOSIS — F988 Other specified behavioral and emotional disorders with onset usually occurring in childhood and adolescence: Secondary | ICD-10-CM | POA: Diagnosis not present

## 2019-10-05 DIAGNOSIS — F411 Generalized anxiety disorder: Secondary | ICD-10-CM | POA: Diagnosis not present

## 2019-10-05 MED ORDER — ARIPIPRAZOLE 5 MG PO TABS: 5.0000 mg | ORAL_TABLET | Freq: Every day | ORAL | 0 refills | Status: DC

## 2019-10-05 MED ORDER — MYDAYIS 50 MG PO CP24: 50.0000 mg | ORAL_CAPSULE | Freq: Every day | ORAL | 0 refills | Status: DC

## 2019-10-05 MED ORDER — LORAZEPAM 1 MG PO TABS: 1.0000 mg | ORAL_TABLET | Freq: Every evening | ORAL | 2 refills | Status: DC | PRN

## 2019-10-05 MED FILL — MYDAYIS ER 50 MG CAPSULE: 50 | 30 days supply | Qty: 30 | Fill #0

## 2019-10-05 MED FILL — ARIPiprazole 5 MG TABS: 5 | 30 days supply | Qty: 30 | Fill #0

## 2019-10-05 MED FILL — LORazepam 1 MG TABS: 1 | 30 days supply | Qty: 30 | Fill #0

## 2019-10-05 NOTE — Progress Notes (Signed)
[facility] MD/PA/NP OP Progress Note  10/05/2019 4:16 PM Kathleen Key  MRN:  [id number] Interview was conducted by phone and I verified that I was speaking with the correct person using two identifiers. I discussed the limitations of evaluation and management by telemedicine and  the availability of in person appointments. Patient expressed understanding and agreed to proceed. Patient location - work; physician - home office.  Chief Complaint: Irritability.  HPI: 54yomarriedfemale with MDD/GAD and ADD.She was doing well on sertraline 200 mg and did not need lorazepam for anxiety often. Ineta was also taking Adderall XR (last dose 40 mg) for ADD. 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 In December 2020 she was admitted to [facility] with dx of MDD severe. Adderall and Zoloft were held while risperidone 2 mg was added. She improved to the extent that she was ready for dc home on 12.11.20. Shehasimproved: no hallucinations, sleep is better, appetite normal, depression and anxiety minimal.We restarted sertraline,Adderall,discontinued risperidone. We switched Adderall to [place] and her focusing is considerably better longer. Still she reports some residual depression/irritability despite taking high dose of sertraline. Sleep is better with lorazepam.  Visit Diagnosis:    ICD-10-CM   1. ADD (attention deficit disorder) without hyperactivity  F98.8   2. GAD (generalized anxiety disorder)  F41.1   3. Major depressive disorder, recurrent episode, mild (HCC)  F33.0     Past Psychiatric History: Please see intake H&P.  Past Medical History:  Past Medical History:  Diagnosis Date  . Asthma   . Fibroids    uterine  . IBS (irritable bowel syndrome)   . Pulmonary sarcoidosis ([place])   . Shortness of breath dyspnea     Past Surgical History:  Procedure Laterality Date  . ABDOMINAL HYSTERECTOMY N/A 10/13/2012   Procedure: HYSTERECTOMY ABDOMINAL;  Surgeon: Jonnie Kind, MD;   Location: [facility];  Service: Gynecology;  Laterality: N/A;  . BILATERAL SALPINGECTOMY Bilateral 10/13/2012   Procedure: BILATERAL SALPINGECTOMY;  Surgeon: Jonnie Kind, MD;  Location: [facility];  Service: Gynecology;  Laterality: Bilateral;  . BREAST BIOPSY Left 05/2015  . BREAST EXCISIONAL BIOPSY Left 05/2015  . BREAST LUMPECTOMY WITH RADIOACTIVE SEED LOCALIZATION Left 07/28/2015   Procedure: LEFT BREAST LUMPECTOMY WITH RADIOACTIVE SEED LOCALIZATION;  Surgeon: Autumn Messing III, MD;  Location: [place];  Service: General;  Laterality: Left;  . WISDOM TOOTH EXTRACTION     Dr. Georgina Snell office-[facility], [place], [place]    Family Psychiatric History: None.  Family History:  Family History  Problem Relation Age of Onset  . Heart disease Mother        leaky valve  . Asthma Mother   . Hypertension Father        pacemaker  . Stroke Father   . Heart disease Father        pacemaker  . Fibromyalgia Sister   . Neuropathy Sister     Social History:  Social History   Socioeconomic History  . Marital status: Married    Spouse name: Not on file  . Number of children: Not on file  . Years of education: Not on file  . Highest education level: Not on file  Occupational History  . Not on file  Tobacco Use  . Smoking status: Never Smoker  . Smokeless tobacco: Never Used  Vaping Use  . Vaping Use: Never used  Substance and Sexual Activity  . Alcohol use: Yes    Comment: occassional  .  Drug use: No  . Sexual activity: Yes    Birth control/protection: Surgical  Other Topics Concern  . Not on file  Social History Narrative  . Not on file   Social Determinants of Health   Financial Resource Strain:   . Difficulty of Paying Living Expenses:   Food Insecurity:   . Worried About Charity fundraiser in the Last Year:   . Arboriculturist in the Last Year:   Transportation Needs:   . Film/video editor (Medical):   [name] Lack of Transportation (Non-Medical):    Physical Activity:   . Days of Exercise per Week:   . Minutes of Exercise per Session:   Stress:   . Feeling of Stress :   Social Connections:   . Frequency of Communication with Friends and Family:   . Frequency of Social Gatherings with Friends and Family:   . Attends Religious Services:   . Active Member of Clubs or Organizations:   . Attends Archivist Meetings:   [name] Marital Status:     Allergies:  Allergies  Allergen Reactions  . Dilaudid [Hydromorphone Hcl] Itching  . Percocet [Oxycodone-Acetaminophen] Itching  . Excedrin Back & [Acetaminophen-Aspirin Buffered] Other (See Comments)    SUGAR-SPIKE, NERVOUSNESS    Metabolic Disorder Labs: Lab Results  Component Value Date   HGBA1C 5.6 03/07/2019   MPG 114.02 03/07/2019   No results found for: PROLACTIN Lab Results  Component Value Date   CHOL 194 03/07/2019   TRIG 78 03/07/2019   HDL 74 03/07/2019   CHOLHDL 2.6 03/07/2019   VLDL 16 03/07/2019   LDLCALC 104 (H) 03/07/2019   Lab Results  Component Value Date   TSH 2.368 03/07/2019    Therapeutic Level Labs: No results found for: LITHIUM No results found for: VALPROATE No components found for:  CBMZ  Current Medications: Current Outpatient Medications  Medication Sig Dispense Refill  . albuterol (VENTOLIN HFA) 108 (90 Base) MCG/ACT inhaler Inhale 2 puffs every 6 hours if needed for breathing 18 g 12  . [START ON 10/06/2019] Amphet-Dextroamphet 3-Bead ER (MYDAYIS) 50 MG CP24 Take 50 mg by mouth daily. 30 capsule 0  . [START ON 11/06/2019] Amphet-Dextroamphet 3-Bead ER (MYDAYIS) 50 MG CP24 Take 50 mg by mouth daily. 30 capsule 0  . [START ON 12/07/2019] Amphet-Dextroamphet 3-Bead ER (MYDAYIS) 50 MG CP24 Take 50 mg by mouth daily. 30 capsule 0  . ARIPiprazole (ABILIFY) 5 MG tablet Take 1 tablet (5 mg total) by mouth daily at 6 PM. 30 tablet 0  . Dietary Management Product (ENLYTE) CAPS Take 1 capsule by mouth daily at 12 noon. May substitute Enbrace HR if  covered  May get from [email or web address] if neither are  covered 30 capsule 11  . estradiol (ESTRACE) 1 MG tablet Take 1 mg by mouth daily.    Derrill Memo ON 11/02/2019] LORazepam (ATIVAN) 1 MG tablet Take 1 tablet (1 mg total) by mouth at bedtime as needed for sleep. 30 tablet 2  . ondansetron (ZOFRAN ODT) 4 MG disintegrating tablet Take 1 tablet (4 mg total) by mouth every 8 (eight) hours as needed. 20 tablet 0  . polyethylene glycol (MIRALAX / GLYCOLAX) packet Take 17 g by mouth 3 (three) times daily as needed. 14 each 0  . promethazine (PHENERGAN) 25 MG suppository Place 1 suppository (25 mg total) rectally every 6 (six) hours as needed for nausea or vomiting. 12 each 0  . sertraline (ZOLOFT) 100 MG tablet Take 2 tablets (  200 mg total) by mouth daily. 60 tablet 2   No current facility-administered medications for this visit.      Psychiatric Specialty Exam: Review of Systems  Psychiatric/Behavioral: Positive for dysphoric mood.  All other systems reviewed and are negative.   Last menstrual period 10/08/2012.There is no height or weight on file to calculate BMI.  General Appearance: NA  Eye Contact:  NA  Speech:  Clear and Coherent and Normal Rate  Volume:  Normal  Mood:  Irritable  Affect:  NA  Thought Process:  Goal Directed  Orientation:  Full (Time, Place, and Person)  Thought Content: Logical   Suicidal Thoughts:  No  Homicidal Thoughts:  No  Memory:  Immediate;   Good Recent;   Good Remote;   Good  Judgement:  Good  Insight:  Good  Psychomotor Activity:  NA  Concentration:  Concentration: Good  Recall:  Good  Fund of Knowledge: Good  Language: Good  Akathisia:  Negative  Handed:  Right  AIMS (if indicated): not done  Assets:  Communication Skills Desire for Improvement Financial Resources/Insurance Housing Intimacy Social Support Talents/Skills  ADL's:  Intact  Cognition: WNL  Sleep:  Good   Screenings: AIMS     Admission (Discharged) from OP Visit from  03/05/2019 in [facility]  AIMS Total Score 0    AUDIT     Admission (Discharged) from OP Visit from 03/05/2019 in [facility]  Alcohol Use Disorder Identification Test Final Score (AUDIT) 4       Assessment and Plan: 54yomarriedfemale with MDD/GAD and ADD.She was doing well on sertraline 200 mg and did not need lorazepam for anxiety often. Ronasia was also taking Adderall XR (last dose 40 mg) for ADD. 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 In December 2020 she was admitted to [facility] with dx of MDD severe. Adderall and Zoloft were held while risperidone 2 mg was added. She improved to the extent that she was ready for dc home on 12.11.20. Shehasimproved: no hallucinations, sleep is better, appetite normal, depression and anxiety minimal.We restarted sertraline,Adderall,discontinued risperidone. We switched Adderall to [place] and her focusing is considerably better longer. Still she reports some residual depression/irritability despite taking high dose of sertraline. Sleep is better with lorazepam.  Dx:MDD recurrent mild;ADD; GAD;  Plan:Continuesertraline 200 mg daily, lorazepam 1 mg at HS,and Mydayis 50 mg. I will add 5 mg of aripiprazole in PM for augmentation. Next appointment in1 month. The plan was discussed with patientwho had an opportunity to ask questions and these were all answered. I spend39minutes inphone consultation with the patient.    Stephanie Acre, MD 10/05/2019, 4:16 PM

## 2019-10-07 ENCOUNTER — Other Ambulatory Visit: Payer: Self-pay | Admitting: Internal Medicine

## 2019-10-07 DIAGNOSIS — E2839 Other primary ovarian failure: Secondary | ICD-10-CM

## 2019-10-12 ENCOUNTER — Telehealth (HOSPITAL_COMMUNITY): Payer: Self-pay | Admitting: *Deleted

## 2019-10-12 NOTE — Telephone Encounter (Signed)
Pt called with c/o spontaneous vomiting without nausea she feels is r/t the Abilify 5mg . Pt cannot relate it to anything else. Please review and advise. Pt next appointment on 11/04/19.

## 2019-10-12 NOTE — Telephone Encounter (Signed)
Quite possible - I suggest she cuts tablets in half and tries 2.5 mg. It may/should be better tolerated.

## 2019-10-12 NOTE — Telephone Encounter (Signed)
Pt VM box full. Unable to leave a message.

## 2019-10-18 ENCOUNTER — Other Ambulatory Visit (HOSPITAL_COMMUNITY): Payer: Self-pay | Admitting: Psychiatry

## 2019-10-18 MED FILL — SERTRALINE HCL 100 MG TAB: 100 | 30 days supply | Qty: 45 | Fill #0

## 2019-11-04 ENCOUNTER — Other Ambulatory Visit: Payer: Self-pay

## 2019-11-04 ENCOUNTER — Telehealth (INDEPENDENT_AMBULATORY_CARE_PROVIDER_SITE_OTHER): Payer: 59 | Admitting: Psychiatry

## 2019-11-04 DIAGNOSIS — F988 Other specified behavioral and emotional disorders with onset usually occurring in childhood and adolescence: Secondary | ICD-10-CM

## 2019-11-04 DIAGNOSIS — F411 Generalized anxiety disorder: Secondary | ICD-10-CM | POA: Diagnosis not present

## 2019-11-04 DIAGNOSIS — F330 Major depressive disorder, recurrent, mild: Secondary | ICD-10-CM | POA: Diagnosis not present

## 2019-11-04 MED ORDER — SERTRALINE HCL 100 MG PO TABS: 150.0000 mg | ORAL_TABLET | Freq: Every day | ORAL | 1 refills | Status: DC

## 2019-11-04 NOTE — Progress Notes (Signed)
[place] MD/PA/NP OP Progress Note  11/04/2019 4:14 PM KERRI KOVACIK  MRN:  [id number] Interview was conducted by phone and I verified that I was speaking with the correct person using two identifiers. I discussed the limitations of evaluation and management by telemedicine and  the availability of in person appointments. Patient expressed understanding and agreed to proceed. Patient location - home; physician - home office.  Chief Complaint: Fatigue in afternoon.  HPI:  54yomarriedfemale with MDD/GAD and ADD.She was doing well on sertraline 200 mg and did not need lorazepam for anxiety often. Abri was also taking Adderall XR (last dose 40 mg) for ADD. 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 Adderall and Zoloft were held while risperidone 2 mg was added. She improved to the extent that she was ready for dc home on 12.11.20. Shehasimproved: no hallucinations, sleep is better, appetite normal, depression and anxiety minimal.We restarted sertraline,Adderall,discontinued risperidone. We switched Adderall to [place] and her focusing is considerably better longer. Still she reports some residual depression despite taking high dose of sertraline. Sleep is better with lorazepam.She also reports feeling tired from noon on. I added aripiprazole 5 mg to augment sertraline but she could not tolerate GI side effects (nause, vomiting).    Visit Diagnosis:    ICD-10-CM   1. Major depressive disorder, recurrent episode, mild (HCC)  F33.0   2. ADD (attention deficit disorder) without hyperactivity  F98.8   3. GAD (generalized anxiety disorder)  F41.1     Past Psychiatric History: Please see intake H&P.  Past Medical History:  Past Medical History:  Diagnosis Date  . Asthma   . Fibroids    uterine  . IBS (irritable bowel syndrome)   . Pulmonary sarcoidosis ([place])   . Shortness of breath dyspnea     Past Surgical History:  Procedure Laterality  Date  . ABDOMINAL HYSTERECTOMY N/A 10/13/2012   Procedure: HYSTERECTOMY ABDOMINAL;  Surgeon: Jonnie Kind, MD;  Location: [facility];  Service: Gynecology;  Laterality: N/A;  . BILATERAL SALPINGECTOMY Bilateral 10/13/2012   Procedure: BILATERAL SALPINGECTOMY;  Surgeon: Jonnie Kind, MD;  Location: [facility];  Service: Gynecology;  Laterality: Bilateral;  . BREAST BIOPSY Left 05/2015  . BREAST EXCISIONAL BIOPSY Left 05/2015  . BREAST LUMPECTOMY WITH RADIOACTIVE SEED LOCALIZATION Left 07/28/2015   Procedure: LEFT BREAST LUMPECTOMY WITH RADIOACTIVE SEED LOCALIZATION;  Surgeon: Autumn Messing III, MD;  Location: [place];  Service: General;  Laterality: Left;  . WISDOM TOOTH EXTRACTION     Dr. Georgina Snell office-[facility], [place], [place]    Family Psychiatric History: None.  Family History:  Family History  Problem Relation Age of Onset  . Heart disease Mother        leaky valve  . Asthma Mother   . Hypertension Father        pacemaker  . Stroke Father   . Heart disease Father        pacemaker  . Fibromyalgia Sister   . Neuropathy Sister     Social History:  Social History   Socioeconomic History  . Marital status: Married    Spouse name: Not on file  . Number of children: Not on file  . Years of education: Not on file  . Highest education level: Not on file  Occupational History  . Not on file  Tobacco Use  . Smoking status: Never Smoker  . Smokeless tobacco: Never Used  Vaping Use  . Vaping Use:  Never used  Substance and Sexual Activity  . Alcohol use: Yes    Comment: occassional  . Drug use: No  . Sexual activity: Yes    Birth control/protection: Surgical  Other Topics Concern  . Not on file  Social History Narrative  . Not on file   Social Determinants of Health   Financial Resource Strain:   . Difficulty of Paying Living Expenses:   Food Insecurity:   . Worried About Charity fundraiser in the Last Year:   . Arboriculturist in the Last  Year:   Transportation Needs:   . Film/video editor (Medical):   [name] Lack of Transportation (Non-Medical):   Physical Activity:   . Days of Exercise per Week:   . Minutes of Exercise per Session:   Stress:   . Feeling of Stress :   Social Connections:   . Frequency of Communication with Friends and Family:   . Frequency of Social Gatherings with Friends and Family:   . Attends Religious Services:   . Active Member of Clubs or Organizations:   . Attends Archivist Meetings:   [name] Marital Status:     Allergies:  Allergies  Allergen Reactions  . Dilaudid [Hydromorphone Hcl] Itching  . Percocet [Oxycodone-Acetaminophen] Itching  . Excedrin Back & [Acetaminophen-Aspirin Buffered] Other (See Comments)    SUGAR-SPIKE, NERVOUSNESS    Metabolic Disorder Labs: Lab Results  Component Value Date   HGBA1C 5.6 03/07/2019   MPG 114.02 03/07/2019   No results found for: PROLACTIN Lab Results  Component Value Date   CHOL 194 03/07/2019   TRIG 78 03/07/2019   HDL 74 03/07/2019   CHOLHDL 2.6 03/07/2019   VLDL 16 03/07/2019   LDLCALC 104 (H) 03/07/2019   Lab Results  Component Value Date   TSH 2.368 03/07/2019    Therapeutic Level Labs: No results found for: LITHIUM No results found for: VALPROATE No components found for:  CBMZ  Current Medications: Current Outpatient Medications  Medication Sig Dispense Refill  . albuterol (VENTOLIN HFA) 108 (90 Base) MCG/ACT inhaler Inhale 2 puffs every 6 hours if needed for breathing 18 g 12  . Amphet-Dextroamphet 3-Bead ER (MYDAYIS) 50 MG CP24 Take 50 mg by mouth daily. 30 capsule 0  . [START ON 11/06/2019] Amphet-Dextroamphet 3-Bead ER (MYDAYIS) 50 MG CP24 Take 50 mg by mouth daily. 30 capsule 0  . [START ON 12/07/2019] Amphet-Dextroamphet 3-Bead ER (MYDAYIS) 50 MG CP24 Take 50 mg by mouth daily. 30 capsule 0  . ARIPiprazole (ABILIFY) 5 MG tablet Take 1 tablet (5 mg total) by mouth daily at 6 PM. 30 tablet 0  . Dietary Management  Product (ENLYTE) CAPS Take 1 capsule by mouth daily at 12 noon. May substitute Enbrace HR if covered  May get from [email or web address] if neither are  covered 30 capsule 11  . estradiol (ESTRACE) 1 MG tablet Take 1 mg by mouth daily.    [name] LORazepam (ATIVAN) 1 MG tablet Take 1 tablet (1 mg total) by mouth at bedtime as needed for sleep. 30 tablet 2  . ondansetron (ZOFRAN ODT) 4 MG disintegrating tablet Take 1 tablet (4 mg total) by mouth every 8 (eight) hours as needed. 20 tablet 0  . polyethylene glycol (MIRALAX / GLYCOLAX) packet Take 17 g by mouth 3 (three) times daily as needed. 14 each 0  . promethazine (PHENERGAN) 25 MG suppository Place 1 suppository (25 mg total) rectally every 6 (six) hours as needed for nausea or vomiting.  12 each 0  . [START ON 12/18/2019] sertraline (ZOLOFT) 100 MG tablet Take 1.5 tablets (150 mg total) by mouth daily. Take it at dinner time. 45 tablet 1   No current facility-administered medications for this visit.    Psychiatric Specialty Exam: Review of Systems  Constitutional: Positive for fatigue.  Psychiatric/Behavioral: The patient is nervous/anxious.   All other systems reviewed and are negative.   Last menstrual period 10/08/2012.There is no height or weight on file to calculate BMI.  General Appearance: NA  Eye Contact:  NA  Speech:  Clear and Coherent and Normal Rate  Volume:  Normal  Mood:  Some depression/anxiety.  Affect:  NA  Thought Process:  Goal Directed and Linear  Orientation:  Full (Time, Place, and Person)  Thought Content: Logical   Suicidal Thoughts:  No  Homicidal Thoughts:  No  Memory:  Immediate;   Good Recent;   Good Remote;   Good  Judgement:  Good  Insight:  Good  Psychomotor Activity:  NA  Concentration:  Concentration: Fair  Recall:  Good  Fund of Knowledge: Good  Language: Good  Akathisia:  Negative  Handed:  Right  AIMS (if indicated): not done  Assets:  Communication Skills Desire for Improvement Financial  Resources/Insurance Housing Vocational/Educational  ADL's:  Intact  Cognition: WNL  Sleep:  Good   Screenings: AIMS     Admission (Discharged) from OP Visit from 03/05/2019 in [facility]  AIMS Total Score 0    AUDIT     Admission (Discharged) from OP Visit from 03/05/2019 in [facility]  Alcohol Use Disorder Identification Test Final Score (AUDIT) 4       Assessment and Plan: 54yomarriedfemale with MDD/GAD and ADD.She was doing well on sertraline 200 mg and did not need lorazepam for anxiety often. Analaura was also taking Adderall XR (last dose 40 mg) for ADD. 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 Adderall and Zoloft were held while risperidone 2 mg was added. She improved to the extent that she was ready for dc home on 12.11.20. Shehasimproved: no hallucinations, sleep is better, appetite normal, depression and anxiety minimal.We restarted sertraline,Adderall,discontinued risperidone. We switched Adderall to [place] and her focusing is considerably better longer. Still she reports some residual depression despite taking high dose of sertraline. Sleep is better with lorazepam.She also reports feeling tired from noon on. I added aripiprazole 5 mg to augment sertraline but she could not tolerate GI side effects (nause, vomiting).   Dx:MDD recurrent mild;ADD; GAD;  Plan:Continuesertraline 150 mg daily but move it to dinner time,lorazepam 1 mgat HS,and Mydayis 50 mg. We will again try aripiprazole in PM but 1/2 table (2.5 mg) for augmentation. If she tolerates this dose and finds it helpful I will renew it as 2 mg. Next appointment in2 months. The plan was discussed with patientwho had an opportunity to ask questions and these were all answered. I spend72minutes inphone consultation with the patient.    Stephanie Acre, MD 11/04/2019, 4:14 PM

## 2019-11-09 MED FILL — SERTRALINE HCL 100 MG TAB: 100 | 30 days supply | Qty: 60 | Fill #0

## 2019-11-11 ENCOUNTER — Encounter: Payer: Self-pay | Admitting: Internal Medicine

## 2019-11-11 ENCOUNTER — Ambulatory Visit: Payer: 59 | Admitting: Internal Medicine

## 2019-11-11 ENCOUNTER — Other Ambulatory Visit: Payer: Self-pay

## 2019-11-11 VITALS — BP 115/70 | HR 88 | Temp 97.6°F | Ht 64.5 in | Wt 124.4 lb

## 2019-11-11 DIAGNOSIS — D869 Sarcoidosis, unspecified: Secondary | ICD-10-CM | POA: Diagnosis not present

## 2019-11-11 DIAGNOSIS — U071 COVID-19: Secondary | ICD-10-CM

## 2019-11-11 MED ORDER — ALBUTEROL SULFATE HFA 108 (90 BASE) MCG/ACT IN AERS: INHALATION_SPRAY | RESPIRATORY_TRACT | 12 refills | Status: DC

## 2019-11-11 NOTE — Progress Notes (Signed)
HPI F never smoker followed for lung nodules, asthma/cough, sarcoid based on ACE-no bx ACE 06/02/13 76( 8-52)   Quant TB Gold 06/02/13- 0.2 neg FENO 08/07/16- She was unable to perform- unable to maintain stable airflow PFT 10/23/16-WNL ACE 08/07/16- 81 H (9-67)  BMET, LFTs and CBC were all normal. CT chest 07/2016 showed some improvement in small lung nodules, nothing worse. Still consistent with sarcoid. She had dropped off Plaquenil and will remain off  ---------------------------------------------------------------------------------------  History of Present Illness: 28 yoF never smoker followed for lung nodules, asthma/cough, sarcoid based on rash and  ACE-no bx,  complicated by cough-variant asthma, Depression, ADD Meds include Adderall, neb xopenex, albuterol hfa, Incruse,  lorazepam, trazodone Given Zpak for Respiratory infection symptoms 10/8, before she reported to [place] that [facility] tested her positive for Covid from tested done 10/6.[name] Caught from husband who is now much better. She became ill on 10/1. She is improving gradually- still sweats, chest congestion, cough, orthostatic dizzy. Cough is disturbing. Tessalon and Delsym no help. Willing to try tramadol, knowing it may cross-sensitize with narcotics that cause itching.   Discussed Sarcoid- ACE has come down. Observations/Objective: ACE down from 100 to 73 11/09/2018 Covid swab Neg 9/22, 9/28, + Covid through [place] 10/6 Assessment and Plan: Covid infection. Recovering at home. Discussed potential worsening which would send her to ER. Sarcoid- Originally lung nodules. ACE coming down/ Expect this to burn itself out.  Follow Up Instructions: She is willing to try tramadol for cough, knowing it may cause side-effects similar to her pruritus from narcotics.  Keep appt next August unless needed sooner. Stay in touch through Covid recovery.      11/11/19- 54 yoF never smoker followed for Lung Nodules, Asthma/Cough, Sarcoid based on rash and   ACE-no bx,  complicated by  Depression, ADD, Covid infection 12/2018, Aortic Atherosclerosis, Meds include Adderall, neb xopenex, albuterol hfa,  lorazepam, trazodone, Doing well since recovery from Covid, with less cough and no recent need for inhaled meds. Sarcoid has been clinically inactive, supported by stability of nodules and bronchiectatic changes on CT chest- reviewed. Not using any inhalers now. Tramadol made her itch, as do all narcotics- important because she is pending oophorectomy. CT chest 05/14/19- IMPRESSION: 1. Stable exam. Unchanged appearance of perilymphatic distribution nodularity compatible with the clinical history of sarcoid. Architectural distortion and nodularity within the right upper lobe is unchanged from previous exam. 2. No thoracic adenopathy. Aortic Atherosclerosis (ICD10-I70.0).  ROS-see HPI    "+" = positive Constitutional:    weight loss, night sweats, fevers, chills, fatigue, lassitude. HEENT:    headaches, difficulty swallowing, tooth/dental problems, sore throat,       sneezing, itching, ear ache, +nasal congestion, post nasal drip, snoring CV:    chest pain, orthopnea, PND, swelling in lower extremities, anasarca,                          dizziness, palpitations Resp:  + shortness of breath with exertion or at rest.                productive cough,  + non-productive cough, coughing up of blood.              change in color of mucus.  wheezing.   Skin:    +tiny intradermal inclusion nodule L upper ant chest wall near clavicle GI:  No-   heartburn, indigestion, abdominal pain, nausea, vomiting, diarrhea,  change in bowel habits, loss of appetite GU: dysuria, change in color of urine, no urgency or frequency.   flank pain. MS:   joint pain, stiffness, decreased range of motion, back pain. Neuro-     nothing unusual Psych:  change in mood or affect.  depression or anxiety.   memory loss.  OBJ- Physical Exam General- Alert, Oriented,  Affect-appropriate, Distress- none acute, + slender Skin- rash-none, lesions- none, excoriation- none Lymphadenopathy- none Head- atraumatic            Eyes- Gross vision intact, PERRLA, conjunctivae and secretions clear            Ears- Hearing, canals-normal            Nose- Clear, no-Septal dev, mucus, polyps, erosion, perforation             Throat- Mallampati II , mucosa clear , drainage- none, tonsils- atrophic Neck- flexible , trachea midline, no stridor , thyroid nl, carotid no bruit Chest - symmetrical excursion , unlabored           Heart/CV- RRR , no murmur , no gallop  , no rub, nl s1 s2                           - JVD- none , edema- none, stasis changes- none, varices- none           Lung- clear to P&A, wheeze- none, cough- none, dullness-none, rub- none           Chest wall-  Abd- No HSM Br/ Gen/ Rectal- Not done, not indicated Extrem- cyanosis- none, clubbing, none, atrophy- none, strength- nl Neuro- grossly intact to observation

## 2019-11-11 NOTE — Patient Instructions (Signed)
Order- Angiotensin Converting Enzyme       Dx sarcoid  Please call if we can help

## 2019-11-12 LAB — ANGIOTENSIN CONVERTING ENZYME: Angiotensin-Converting Enzyme: 64 U/L (ref 9–67)

## 2019-11-19 MED FILL — MYDAYIS ER 50 MG CAPSULE: 50 | 30 days supply | Qty: 30 | Fill #0

## 2019-11-19 MED FILL — ESTRADIOL 1 MG TABS: 1 | 90 days supply | Qty: 90 | Fill #0

## 2019-11-21 NOTE — Assessment & Plan Note (Signed)
Symptoms have resolved. Not describing any long-Covid residual.

## 2019-11-21 NOTE — Assessment & Plan Note (Signed)
Clinically inactive with stable lung nodules Plan- update ACE level

## 2019-11-22 MED FILL — LORazepam 1 MG TABS: 1 | 30 days supply | Qty: 30 | Fill #1

## 2019-11-30 ENCOUNTER — Telehealth (HOSPITAL_COMMUNITY): Payer: Self-pay | Admitting: *Deleted

## 2019-11-30 ENCOUNTER — Ambulatory Visit
Admission: RE | Admit: 2019-11-30 | Discharge: 2019-11-30 | Disposition: A | Discharge status: Home / Self Care | Payer: 59 | Source: Ambulatory Visit | Attending: Internal Medicine | Admitting: Internal Medicine

## 2019-11-30 ENCOUNTER — Other Ambulatory Visit: Payer: Self-pay

## 2019-11-30 DIAGNOSIS — R921 Mammographic calcification found on diagnostic imaging of breast: Secondary | ICD-10-CM

## 2019-11-30 NOTE — Telephone Encounter (Signed)
I will call patient later today to discuss her options.

## 2019-11-30 NOTE — Telephone Encounter (Signed)
Patient called and stated that her [employer] was no longer working and that she was having problems and was afraid she would lose her job.  Please review and advise.

## 2019-11-30 NOTE — Telephone Encounter (Signed)
Patient called and stated that the [employer] was no longer working.  She said she was afraid she was going to make a mistake at work and lose her job.  Please review and advise.

## 2019-12-08 MED FILL — SERTRALINE HCL 100 MG TAB: 100 | 30 days supply | Qty: 60 | Fill #1

## 2019-12-20 DIAGNOSIS — R1903 Right lower quadrant abdominal swelling, mass and lump: Secondary | ICD-10-CM | POA: Diagnosis not present

## 2019-12-22 MED FILL — LORazepam 1 MG TABS: 1 | 30 days supply | Qty: 30 | Fill #2

## 2019-12-24 ENCOUNTER — Encounter (HOSPITAL_BASED_OUTPATIENT_CLINIC_OR_DEPARTMENT_OTHER): Payer: Self-pay | Admitting: Obstetrics and Gynecology

## 2019-12-24 ENCOUNTER — Other Ambulatory Visit: Payer: Self-pay

## 2019-12-24 NOTE — Progress Notes (Signed)
Spoke w/ via phone for pre-op interview--- PT Lab needs dos---- T&S              Lab results------ pt getting CBC, CMP done at [facility], she will get Cleo or one of the pre-op nurse to release lab (pt works @[employer])  Pt stated will go have done Monday 12-27-2019 COVID test ------  Per [place] and Cleo pt will get covid tested @[facility]  1555on 12-28-2019 Arrive at ------- 0530 NPO after MN NO Solid Food.  Clear liquids from MN until--- 0430 Medications to take morning of surgery ---- Pepcid, Estradiol, Claritin Diabetic medication ----- n/a Patient Special Instructions ----- n/a Pre-Op special Istructions ----- n/a Patient verbalized understanding of instructions that were given at this phone interview. Patient denies shortness of breath, chest pain, fever, cough at this phone interview.    PCP:  Dr Electa Sniff Pulmonology : Dr C. Young Cassell Clement 11-11-2019 epic Chest x-ray :  Chest CT 05-14-2019 epic

## 2019-12-25 ENCOUNTER — Other Ambulatory Visit (HOSPITAL_COMMUNITY): Payer: 59

## 2019-12-27 ENCOUNTER — Other Ambulatory Visit (HOSPITAL_COMMUNITY): Payer: 59

## 2019-12-27 ENCOUNTER — Other Ambulatory Visit (HOSPITAL_COMMUNITY): Payer: Self-pay | Admitting: Psychiatry

## 2019-12-27 MED FILL — MYDAYIS ER 50 MG CAPSULE: 50 | 30 days supply | Qty: 30 | Fill #0

## 2019-12-28 ENCOUNTER — Other Ambulatory Visit (HOSPITAL_COMMUNITY)
Admission: RE | Admit: 2019-12-28 | Discharge: 2019-12-28 | Disposition: A | Discharge status: Home / Self Care | Payer: 59 | Source: Ambulatory Visit | Attending: Obstetrics and Gynecology | Admitting: Obstetrics and Gynecology

## 2019-12-28 ENCOUNTER — Other Ambulatory Visit: Payer: Self-pay

## 2019-12-28 DIAGNOSIS — Z01812 Encounter for preprocedural laboratory examination: Secondary | ICD-10-CM | POA: Insufficient documentation

## 2019-12-28 DIAGNOSIS — Z20822 Contact with and (suspected) exposure to covid-19: Secondary | ICD-10-CM | POA: Diagnosis not present

## 2019-12-28 LAB — BASIC METABOLIC PANEL
Anion gap: 10 (ref 5–15)
BUN: 21 mg/dL — ABNORMAL HIGH (ref 6–20)
CO2: 27 mmol/L (ref 22–32)
Calcium: 9.2 mg/dL (ref 8.9–10.3)
Chloride: 102 mmol/L (ref 98–111)
Creatinine, Ser: 0.54 mg/dL (ref 0.44–1.00)
GFR calc Af Amer: >60 mL/min (ref >60)
GFR calc non Af Amer: >60 mL/min (ref >60)
Glucose, Bld: 77 mg/dL (ref 70–99)
Potassium: 3.3 mmol/L — ABNORMAL LOW (ref 3.5–5.1)
Sodium: 139 mmol/L (ref 135–145)

## 2019-12-28 LAB — CBC WITH DIFFERENTIAL/PLATELET
Abs Immature Granulocytes: 0 10*3/uL (ref 0.00–0.07)
Basophils Absolute: 0.1 10*3/uL (ref 0.0–0.1)
Basophils Relative: 3 %
Eosinophils Absolute: 0.1 10*3/uL (ref 0.0–0.5)
Eosinophils Relative: 2 %
HCT: 41.1 % (ref 36.0–46.0)
Hemoglobin: 13.4 g/dL (ref 12.0–15.0)
Immature Granulocytes: 0 %
Lymphocytes Relative: 35 %
Lymphs Abs: 1.3 10*3/uL (ref 0.7–4.0)
MCH: 32.2 pg (ref 26.0–34.0)
MCHC: 32.6 g/dL (ref 30.0–36.0)
MCV: 98.8 fL (ref 80.0–100.0)
Monocytes Absolute: 0.4 10*3/uL (ref 0.1–1.0)
Monocytes Relative: 11 %
Neutro Abs: 1.8 10*3/uL (ref 1.7–7.7)
Neutrophils Relative %: 49 %
Platelets: 281 10*3/uL (ref 150–400)
RBC: 4.16 MIL/uL (ref 3.87–5.11)
RDW: 13 % (ref 11.5–15.5)
WBC: 3.7 10*3/uL — ABNORMAL LOW (ref 4.0–10.5)
nRBC: 0 % (ref 0.0–0.2)

## 2019-12-28 LAB — TYPE AND SCREEN
ABO/RH(D): O POS
Antibody Screen: NEGATIVE

## 2019-12-28 NOTE — Anesthesia Preprocedure Evaluation (Addendum)
Anesthesia Evaluation  Patient identified by MRN, date of birth, ID band Patient awake    Reviewed: Allergy & Precautions, NPO status , Patient's Chart, lab work & pertinent test results  Airway Mallampati: III  TM Distance: >3 FB Neck ROM: Full    Dental no notable dental hx.    Pulmonary asthma ,  Pulmonary sarcoidosis   Pulmonary exam normal breath sounds clear to auscultation       Cardiovascular negative cardio ROS Normal cardiovascular exam Rhythm:Regular Rate:Normal     Neuro/Psych PSYCHIATRIC DISORDERS Anxiety Depression negative neurological ROS     GI/Hepatic Neg liver ROS, GERD  Medicated and Controlled,Irritable bowel syndrome with constipation   Endo/Other  negative endocrine ROS  Renal/GU negative Renal ROS     Musculoskeletal negative musculoskeletal ROS (+)   Abdominal   Peds  Hematology negative hematology ROS (+)   Anesthesia Other Findings right ovarian cyst, prob. dermoid, menopausal  Reproductive/Obstetrics                            Anesthesia Physical Anesthesia Plan  ASA: III  Anesthesia Plan: General   Post-op Pain Management:    Induction: Intravenous  PONV Risk Score and Plan: 4 or greater and Ondansetron, Dexamethasone, Midazolam, Scopolamine patch - Pre-op and Treatment may vary due to age or medical condition  Airway Management Planned: Oral ETT  Additional Equipment:   Intra-op Plan:   Post-operative Plan: Extubation in OR  Informed Consent: I have reviewed the patients History and Physical, chart, labs and discussed the procedure including the risks, benefits and alternatives for the proposed anesthesia with the patient or authorized representative who has indicated his/her understanding and acceptance.     Dental advisory given  Plan Discussed with: CRNA  Anesthesia Plan Comments:        Anesthesia Quick Evaluation

## 2019-12-28 NOTE — H&P (Addendum)
Kathleen Key is an 54 y.o.  Female presents for surgical evaluation of right adnexal 2.4cm mass noted by CT when she presented to ER for dehydration sxs last December.  SHe had a hysterectomy with preservation of her ovaries previously due to fibroids.  She had a 3cm dermoid on [place] on the right ovary and normal left, and normal ca 125.  She is currently menopausal on estrogen.,  She desires removal of both ovaries at this time. Patient's last menstrual period was 10/08/2012.    Past Medical History:  Diagnosis Date  . ADD (attention deficit disorder)   . Asthma, cough variant    pulmonology--- dr c. young  . GAD (generalized anxiety disorder)   . GERD (gastroesophageal reflux disease)   . History of 2019 novel coronavirus disease (COVID-19) 01/05/2019   per pt positive results done at [facility] , Dr Lavone Orn,  per pt mild symptoms resolved in one week  . History of urinary retention 08/2012   due to uterine retroverted s/p retroversion and hysterectomy  . History of uterine fibroid   . Irritable bowel syndrome with constipation   . MDD (major depressive disorder)   . Ovarian mass   . Pelvic pain   . Pulmonary sarcoidosis ([place]) 2014   followed by dr c. young-- dx 2014 lung nodules,  clinically inactive and stable  . Shortness of breath dyspnea     Past Surgical History:  Procedure Laterality Date  . ABDOMINAL HYSTERECTOMY N/A 10/13/2012   Procedure: HYSTERECTOMY ABDOMINAL;  Surgeon: Jonnie Kind, MD;  Location: [facility];  Service: Gynecology;  Laterality: N/A;  . BILATERAL SALPINGECTOMY Bilateral 10/13/2012   Procedure: BILATERAL SALPINGECTOMY;  Surgeon: Jonnie Kind, MD;  Location: [facility];  Service: Gynecology;  Laterality: Bilateral;  . BREAST EXCISIONAL BIOPSY Left 05/2015  . BREAST LUMPECTOMY WITH RADIOACTIVE SEED LOCALIZATION Left 07/28/2015   Procedure: LEFT BREAST LUMPECTOMY WITH RADIOACTIVE SEED LOCALIZATION;  Surgeon: Autumn Messing III, MD;  Location: [facility];   Service: General;  Laterality: Left;  . ULNAR NERVE TRANSPOSITION Left 05/2019  . WISDOM TOOTH EXTRACTION     Dr. Georgina Snell office-[facility], [place], [place]    Family History  Problem Relation Age of Onset  . Heart disease Mother        leaky valve  . Asthma Mother   . Hypertension Father        pacemaker  . Stroke Father   . Heart disease Father        pacemaker  . Fibromyalgia Sister   . Neuropathy Sister     Social History:  reports that she has never smoked. She has never used smokeless tobacco. She reports current alcohol use. She reports that she does not use drugs.  Allergies:  Allergies  Allergen Reactions  . Dilaudid [Hydromorphone Hcl] Itching  . Excedrin Back & [Acetaminophen-Aspirin Buffered] Other (See Comments)    SUGAR-SPIKE, NERVOUSNESS  . Oxycodone Itching    percocet  . Tramadol Itching    No medications prior to admission.    Review of Systems  Height 5' 4.5" (1.638 m), weight 57.2 kg, last menstrual period 10/08/2012. Physical Exam  Abd  Nd, nt Pelvic.  Nt, some fullness on right. Cuff intact  Results for orders placed or performed during the hospital encounter of 12/28/19 (from the past 24 hour(s))  CBC with Differential/Platelet     Status: Abnormal   Collection Time: 12/28/19  8:48 AM  Result Value Ref Range   WBC 3.7 (  L) 4.0 - 10.5 K/uL   RBC 4.16 3.87 - 5.11 MIL/uL   Hemoglobin 13.4 12.0 - 15.0 g/dL   HCT 41.1 36 - 46 %   MCV 98.8 80.0 - 100.0 fL   MCH 32.2 26.0 - 34.0 pg   MCHC 32.6 30.0 - 36.0 g/dL   RDW 13.0 11.5 - 15.5 %   Platelets 281 150 - 400 K/uL   nRBC 0.0 0.0 - 0.2 %   Neutrophils Relative % 49 %   Neutro Abs 1.8 1.7 - 7.7 K/uL   Lymphocytes Relative 35 %   Lymphs Abs 1.3 0.7 - 4.0 K/uL   Monocytes Relative 11 %   Monocytes Absolute 0.4 0 - 1 K/uL   Eosinophils Relative 2 %   Eosinophils Absolute 0.1 0 - 0 K/uL   Basophils Relative 3 %   Basophils Absolute 0.1 0 - 0 K/uL   Immature Granulocytes 0 %    Abs Immature Granulocytes 0.00 0.00 - 0.07 K/uL  Basic metabolic panel     Status: Abnormal   Collection Time: 12/28/19  8:48 AM  Result Value Ref Range   Sodium 139 135 - 145 mmol/L   Potassium 3.3 (L) 3.5 - 5.1 mmol/L   Chloride 102 98 - 111 mmol/L   CO2 27 22 - 32 mmol/L   Glucose, Bld 77 70 - 99 mg/dL   BUN 21 (H) 6 - 20 mg/dL   Creatinine, Ser 0.54 0.44 - 1.00 mg/dL   Calcium 9.2 8.9 - 10.3 mg/dL   GFR calc non Af Amer >60 >60 mL/min   GFR calc Af Amer >60 >60 mL/min   Anion gap 10 5 - 15  Type and screen [facility]     Status: None   Collection Time: 12/28/19  8:48 AM  Result Value Ref Range   ABO/RH(D) O POS    Antibody Screen NEG    Sample Expiration 01/11/2020,2359    Extend sample reason      NO TRANSFUSIONS OR PREGNANCY IN THE PAST 3 MONTHS Performed at [facility], [address] [place], [place] [address]     No results found.  Assessment/Plan: Right adnexal mass most c//w benign process such as demoid Plan laparoscopic BSO.  Discussed the risks and benefits, pros and cons, recovery and the possibility of needing to open her to complete the procedure.  She gives her informed consent and wishes to proceed.  Luz Lex 12/28/2019, 9:29 PM   This patient has been seen and examined.   All of her questions were answered.  Labs and vital signs reviewed.  Informed consent has been obtained.  The History and Physical is current.

## 2019-12-29 ENCOUNTER — Other Ambulatory Visit: Payer: Self-pay

## 2019-12-29 ENCOUNTER — Encounter (HOSPITAL_BASED_OUTPATIENT_CLINIC_OR_DEPARTMENT_OTHER): Payer: Self-pay | Admitting: Obstetrics and Gynecology

## 2019-12-29 ENCOUNTER — Encounter (HOSPITAL_BASED_OUTPATIENT_CLINIC_OR_DEPARTMENT_OTHER)
Admission: RE | Disposition: A | Discharge status: Home / Self Care | Payer: Self-pay | Source: Home / Self Care | Attending: Obstetrics and Gynecology

## 2019-12-29 ENCOUNTER — Ambulatory Visit (HOSPITAL_BASED_OUTPATIENT_CLINIC_OR_DEPARTMENT_OTHER): Payer: 59 | Admitting: Anesthesiology

## 2019-12-29 ENCOUNTER — Ambulatory Visit (HOSPITAL_BASED_OUTPATIENT_CLINIC_OR_DEPARTMENT_OTHER)
Admission: RE | Admit: 2019-12-29 | Discharge: 2019-12-29 | Disposition: A | Discharge status: Home / Self Care | Payer: 59 | Attending: Obstetrics and Gynecology | Admitting: Obstetrics and Gynecology

## 2019-12-29 DIAGNOSIS — Z885 Allergy status to narcotic agent status: Secondary | ICD-10-CM | POA: Diagnosis not present

## 2019-12-29 DIAGNOSIS — Z8616 Personal history of COVID-19: Secondary | ICD-10-CM | POA: Diagnosis not present

## 2019-12-29 DIAGNOSIS — N736 Female pelvic peritoneal adhesions (postinfective): Secondary | ICD-10-CM | POA: Diagnosis not present

## 2019-12-29 DIAGNOSIS — D270 Benign neoplasm of right ovary: Secondary | ICD-10-CM | POA: Diagnosis not present

## 2019-12-29 DIAGNOSIS — F909 Attention-deficit hyperactivity disorder, unspecified type: Secondary | ICD-10-CM | POA: Diagnosis not present

## 2019-12-29 DIAGNOSIS — Z886 Allergy status to analgesic agent status: Secondary | ICD-10-CM | POA: Insufficient documentation

## 2019-12-29 DIAGNOSIS — N8312 Corpus luteum cyst of left ovary: Secondary | ICD-10-CM | POA: Insufficient documentation

## 2019-12-29 DIAGNOSIS — J45909 Unspecified asthma, uncomplicated: Secondary | ICD-10-CM | POA: Diagnosis not present

## 2019-12-29 DIAGNOSIS — N83201 Unspecified ovarian cyst, right side: Secondary | ICD-10-CM | POA: Diagnosis not present

## 2019-12-29 HISTORY — DX: Cough variant asthma: J45.991

## 2019-12-29 HISTORY — DX: Pelvic and perineal pain unspecified side: R10.20

## 2019-12-29 HISTORY — DX: Gastro-esophageal reflux disease without esophagitis: K21.9

## 2019-12-29 HISTORY — DX: Irritable bowel syndrome with constipation: K58.1

## 2019-12-29 HISTORY — DX: Generalized anxiety disorder: F41.1

## 2019-12-29 HISTORY — DX: Other specified behavioral and emotional disorders with onset usually occurring in childhood and adolescence: F98.8

## 2019-12-29 HISTORY — DX: Other noninflammatory disorders of ovary, fallopian tube and broad ligament: N83.8

## 2019-12-29 HISTORY — DX: Personal history of other benign neoplasm: Z86.018

## 2019-12-29 HISTORY — DX: Pelvic and perineal pain: R10.2

## 2019-12-29 HISTORY — PX: LAPAROSCOPIC BILATERAL SALPINGO OOPHERECTOMY: SHX5890

## 2019-12-29 HISTORY — DX: Major depressive disorder, single episode, unspecified: F32.9

## 2019-12-29 LAB — SARS CORONAVIRUS 2 (TAT 6-24 HRS): SARS Coronavirus 2: NEGATIVE

## 2019-12-29 LAB — TYPE AND SCREEN
ABO/RH(D): O POS
Antibody Screen: NEGATIVE

## 2019-12-29 SURGERY — SALPINGO-OOPHORECTOMY, BILATERAL, LAPAROSCOPIC
Anesthesia: General | Site: Abdomen | Laterality: Bilateral

## 2019-12-29 MED ORDER — FENTANYL CITRATE (PF) 100 MCG/2ML IJ SOLN
INTRAMUSCULAR | Status: DC | PRN
Start: 2019-12-29 — End: 2019-12-29
  Administered 2019-12-29: 50 ug via INTRAVENOUS
  Administered 2019-12-29: 100 ug via INTRAVENOUS

## 2019-12-29 MED ORDER — SODIUM CHLORIDE 0.9 % IV SOLN
2.0000 g | INTRAVENOUS | Status: AC
  Administered 2019-12-29: 2 g via INTRAVENOUS

## 2019-12-29 MED ORDER — ROCURONIUM BROMIDE 10 MG/ML (PF) SYRINGE
PREFILLED_SYRINGE | INTRAVENOUS | Status: AC
  Filled 2019-12-29: qty 10

## 2019-12-29 MED ORDER — KETOROLAC TROMETHAMINE 30 MG/ML IJ SOLN: 30.0000 mg | Freq: Once | INTRAMUSCULAR | Status: DC

## 2019-12-29 MED ORDER — PROMETHAZINE HCL 25 MG/ML IJ SOLN: 6.2500 mg | INTRAMUSCULAR | Status: DC | PRN

## 2019-12-29 MED ORDER — SCOPOLAMINE 1 MG/3DAYS TD PT72
1.0000 | MEDICATED_PATCH | TRANSDERMAL | Status: DC
  Administered 2019-12-29: 1.5 mg via TRANSDERMAL

## 2019-12-29 MED ORDER — LIDOCAINE 2% (20 MG/ML) 5 ML SYRINGE
INTRAMUSCULAR | Status: DC | PRN
  Administered 2019-12-29: 60 mg via INTRAVENOUS

## 2019-12-29 MED ORDER — GLYCOPYRROLATE PF 0.2 MG/ML IJ SOSY
PREFILLED_SYRINGE | INTRAMUSCULAR | Status: DC | PRN
  Administered 2019-12-29: .2 mg via INTRAVENOUS

## 2019-12-29 MED ORDER — ONDANSETRON HCL 4 MG/2ML IJ SOLN
INTRAMUSCULAR | Status: AC
  Filled 2019-12-29: qty 2

## 2019-12-29 MED ORDER — IBUPROFEN 600 MG PO TABS: 600.0000 mg | ORAL_TABLET | Freq: Four times a day (QID) | ORAL | 0 refills | Status: DC | PRN

## 2019-12-29 MED ORDER — EPHEDRINE SULFATE-NACL 50-0.9 MG/10ML-% IV SOSY
PREFILLED_SYRINGE | INTRAVENOUS | Status: DC | PRN
  Administered 2019-12-29: 10 mg via INTRAVENOUS

## 2019-12-29 MED ORDER — SODIUM CHLORIDE 0.9 % IV SOLN
INTRAVENOUS | Status: AC
  Filled 2019-12-29: qty 2

## 2019-12-29 MED ORDER — PHENYLEPHRINE 40 MCG/ML (10ML) SYRINGE FOR IV PUSH (FOR BLOOD PRESSURE SUPPORT)
PREFILLED_SYRINGE | INTRAVENOUS | Status: DC | PRN
  Administered 2019-12-29: 40 ug via INTRAVENOUS

## 2019-12-29 MED ORDER — ATROPINE SULFATE 1 MG/10ML IJ SOSY
PREFILLED_SYRINGE | INTRAMUSCULAR | Status: AC
  Filled 2019-12-29: qty 10

## 2019-12-29 MED ORDER — BUPIVACAINE HCL (PF) 0.25 % IJ SOLN
INTRAMUSCULAR | Status: DC | PRN
  Administered 2019-12-29: 10 mL

## 2019-12-29 MED ORDER — KETOROLAC TROMETHAMINE 30 MG/ML IJ SOLN
INTRAMUSCULAR | Status: AC
  Filled 2019-12-29: qty 1

## 2019-12-29 MED ORDER — POVIDONE-IODINE 10 % EX SWAB: 2.0000 "application " | Freq: Once | CUTANEOUS | Status: DC

## 2019-12-29 MED ORDER — SCOPOLAMINE 1 MG/3DAYS TD PT72
MEDICATED_PATCH | TRANSDERMAL | Status: AC
  Filled 2019-12-29: qty 1

## 2019-12-29 MED ORDER — FENTANYL CITRATE (PF) 100 MCG/2ML IJ SOLN
25.0000 ug | INTRAMUSCULAR | Status: DC | PRN
  Administered 2019-12-29 (×2): 50 ug via INTRAVENOUS

## 2019-12-29 MED ORDER — SUGAMMADEX SODIUM 200 MG/2ML IV SOLN
INTRAVENOUS | Status: DC | PRN
  Administered 2019-12-29: 150 mg via INTRAVENOUS

## 2019-12-29 MED ORDER — KETOROLAC TROMETHAMINE 30 MG/ML IJ SOLN
INTRAMUSCULAR | Status: DC | PRN
  Administered 2019-12-29: 30 mg via INTRAVENOUS

## 2019-12-29 MED ORDER — DEXAMETHASONE SODIUM PHOSPHATE 10 MG/ML IJ SOLN
INTRAMUSCULAR | Status: DC | PRN
  Administered 2019-12-29: 10 mg via INTRAVENOUS

## 2019-12-29 MED ORDER — FENTANYL CITRATE (PF) 250 MCG/5ML IJ SOLN
INTRAMUSCULAR | Status: AC
Start: 2019-12-29 — End: ?
  Filled 2019-12-29: qty 5

## 2019-12-29 MED ORDER — ONDANSETRON HCL 4 MG/2ML IJ SOLN
INTRAMUSCULAR | Status: DC | PRN
  Administered 2019-12-29: 4 mg via INTRAVENOUS

## 2019-12-29 MED ORDER — EPHEDRINE SULFATE 50 MG/ML IJ SOLN
INTRAMUSCULAR | Status: DC | PRN
  Administered 2019-12-29 (×4): 10 mg via INTRAVENOUS

## 2019-12-29 MED ORDER — PROPOFOL 10 MG/ML IV BOLUS
INTRAVENOUS | Status: AC
  Filled 2019-12-29: qty 40

## 2019-12-29 MED ORDER — FENTANYL CITRATE (PF) 100 MCG/2ML IJ SOLN
INTRAMUSCULAR | Status: AC
  Filled 2019-12-29: qty 2

## 2019-12-29 MED ORDER — PROPOFOL 10 MG/ML IV BOLUS
INTRAVENOUS | Status: DC | PRN
  Administered 2019-12-29: 200 mg via INTRAVENOUS

## 2019-12-29 MED ORDER — LIDOCAINE 2% (20 MG/ML) 5 ML SYRINGE
INTRAMUSCULAR | Status: AC
  Filled 2019-12-29: qty 5

## 2019-12-29 MED ORDER — MIDAZOLAM HCL 5 MG/5ML IJ SOLN
INTRAMUSCULAR | Status: DC | PRN
  Administered 2019-12-29: 1 mg via INTRAVENOUS

## 2019-12-29 MED ORDER — DEXAMETHASONE SODIUM PHOSPHATE 10 MG/ML IJ SOLN
INTRAMUSCULAR | Status: AC
  Filled 2019-12-29: qty 1

## 2019-12-29 MED ORDER — SODIUM CHLORIDE 0.9 % IV SOLN: INTRAVENOUS | Status: DC

## 2019-12-29 MED ORDER — GLYCOPYRROLATE PF 0.2 MG/ML IJ SOSY
PREFILLED_SYRINGE | INTRAMUSCULAR | Status: AC
  Filled 2019-12-29: qty 1

## 2019-12-29 MED ORDER — OXYCODONE HCL 5 MG PO TABS: 5.0000 mg | ORAL_TABLET | ORAL | 0 refills | Status: DC | PRN

## 2019-12-29 MED ORDER — MIDAZOLAM HCL 2 MG/2ML IJ SOLN
INTRAMUSCULAR | Status: AC
  Filled 2019-12-29: qty 2

## 2019-12-29 MED ORDER — EPHEDRINE 5 MG/ML INJ
INTRAVENOUS | Status: AC
  Filled 2019-12-29: qty 10

## 2019-12-29 MED ORDER — ROCURONIUM BROMIDE 10 MG/ML (PF) SYRINGE
PREFILLED_SYRINGE | INTRAVENOUS | Status: DC | PRN
  Administered 2019-12-29: 40 mg via INTRAVENOUS
  Administered 2019-12-29: 20 mg via INTRAVENOUS

## 2019-12-29 MED ORDER — ACETAMINOPHEN 10 MG/ML IV SOLN: 1000.0000 mg | Freq: Once | INTRAVENOUS | Status: DC | PRN

## 2019-12-29 MED FILL — oxyCODONE HCL 5 MG TABS: 5 | 2 days supply | Qty: 10 | Fill #0

## 2019-12-29 MED FILL — IBUPROFEN 600 MG TABLET: 600 | 5 days supply | Qty: 20 | Fill #0

## 2019-12-29 SURGICAL SUPPLY — 50 items
BAG RETRIEVAL 10 (BASKET)
BAG RETRIEVAL 10MM (BASKET)
CABLE HIGH FREQUENCY MONO STRZ (ELECTRODE) IMPLANT
CANISTER SUCT 3000ML PPV (MISCELLANEOUS) IMPLANT
CATH ROBINSON RED A/P 16FR (CATHETERS) IMPLANT
CLOTH BEACON ORANGE TIMEOUT ST (SAFETY) ×3 IMPLANT
COVER WAND RF STERILE (DRAPES) ×3 IMPLANT
DERMABOND ADVANCED (GAUZE/BANDAGES/DRESSINGS)
DERMABOND ADVANCED .7 DNX12 (GAUZE/BANDAGES/DRESSINGS) IMPLANT
DRSG COVADERM PLUS 2X2 (GAUZE/BANDAGES/DRESSINGS) ×3 IMPLANT
DRSG OPSITE POSTOP 3X4 (GAUZE/BANDAGES/DRESSINGS) IMPLANT
DURAPREP 26ML APPLICATOR (WOUND CARE) ×3 IMPLANT
ELECT REM PT RETURN 9FT ADLT (ELECTROSURGICAL) ×3
ELECTRODE REM PT RTRN 9FT ADLT (ELECTROSURGICAL) ×1 IMPLANT
FORCEPS CUTTING 45CM 5MM (CUTTING FORCEPS) IMPLANT
GAUZE 4X4 16PLY RFD (DISPOSABLE) ×3 IMPLANT
GLOVE BIO SURGEON STRL SZ8 (GLOVE) ×3 IMPLANT
GLOVE SURG ORTHO 8.0 STRL STRW (GLOVE) ×3 IMPLANT
GOWN STRL REUS W/ TWL LRG LVL3 (GOWN DISPOSABLE) ×1 IMPLANT
GOWN STRL REUS W/TWL LRG LVL3 (GOWN DISPOSABLE) ×3
HOLDER FOLEY CATH W/STRAP (MISCELLANEOUS) IMPLANT
IV NS 1000ML (IV SOLUTION)
IV NS 1000ML BAXH (IV SOLUTION) IMPLANT
IV NS IRRIG 3000ML ARTHROMATIC (IV SOLUTION) IMPLANT
KIT TURNOVER CYSTO (KITS) ×3 IMPLANT
LIGASURE LAP L-HOOKWIRE 5 44CM (INSTRUMENTS) ×3 IMPLANT
NEEDLE INSUFFLATION 120MM (ENDOMECHANICALS) ×3 IMPLANT
NS IRRIG 500ML POUR BTL (IV SOLUTION) ×3 IMPLANT
PACK LAPAROSCOPY BASIN (CUSTOM PROCEDURE TRAY) ×3 IMPLANT
PACK TRENDGUARD 450 HYBRID PRO (MISCELLANEOUS) ×1 IMPLANT
PAD OB MATERNITY 4.3X12.25 (PERSONAL CARE ITEMS) ×3 IMPLANT
PAD PREP 24X48 CUFFED NSTRL (MISCELLANEOUS) ×3 IMPLANT
POUCH SPECIMEN RETRIEVAL 10MM (ENDOMECHANICALS) ×3 IMPLANT
SCISSORS LAP 5X35 DISP (ENDOMECHANICALS) IMPLANT
SET SUCTION IRRIG HYDROSURG (IRRIGATION / IRRIGATOR) IMPLANT
SET TUBE SMOKE EVAC HIGH FLOW (TUBING) ×3 IMPLANT
SOLUTION ELECTROLUBE (MISCELLANEOUS) IMPLANT
SUT VICRYL 0 UR6 27IN ABS (SUTURE) ×3 IMPLANT
SUT VICRYL RAPIDE 3 0 (SUTURE) ×3 IMPLANT
SYS BAG RETRIEVAL 10MM (BASKET)
SYSTEM BAG RETRIEVAL 10MM (BASKET) IMPLANT
TOWEL OR 17X26 10 PK STRL BLUE (TOWEL DISPOSABLE) ×3 IMPLANT
TRAY FOLEY W/BAG SLVR 14FR LF (SET/KITS/TRAYS/PACK) ×3 IMPLANT
TRENDGUARD 450 HYBRID PRO PACK (MISCELLANEOUS) ×3
TROCAR OPTI TIP 5M 100M (ENDOMECHANICALS) ×3 IMPLANT
TROCAR XCEL DIL TIP R 11M (ENDOMECHANICALS) ×3 IMPLANT
TUBE CONNECTING 12'X1/4 (SUCTIONS)
TUBE CONNECTING 12X1/4 (SUCTIONS) IMPLANT
WARMER LAPAROSCOPE (MISCELLANEOUS) ×3 IMPLANT
WATER STERILE IRR 500ML POUR (IV SOLUTION) IMPLANT

## 2019-12-29 NOTE — Op Note (Signed)
NAME: Kathleen Key, MCANELLY MEDICAL RECORD GQ:[id number] ACCOUNT [id number] DATE OF BIRTH:March 19, 1966 FACILITY: [facility] LOCATION: [facility] PHYSICIAN:Elsey Holts Dominic Pea, MD  OPERATIVE REPORT  DATE OF PROCEDURE:  12/29/2019  PREOPERATIVE DIAGNOSES:  Right ovarian cyst consistent with dermoid.  POSTOPERATIVE DIAGNOSIS:  Right ovarian cyst consistent with dermoid.  PROCEDURE:  Laparoscopic bilateral salpingo-oophorectomy and lysis of adhesions.  SURGEON:  Dr. Louretta Shorten  ASSISTANT:  Dr. Mardelle Matte  INDICATIONS:  The patient is a 54 year old with a right ovarian mass consistent with a dermoid tumor confirmed by CAT scan and also ultrasound and a normal CA-125.  She desires definitive surgical removal.  She is menopausal and at this time requested  removing not only the ovarian mass, but also the left ovary.  We discussed the risks and benefits at length and informed consent was obtained.  Findings at time of surgery were a right ovarian cyst 3.5 to 4 cm in size consistent with a dermoid, pelvic adhesions from the bowel to the vagina and the left tubo-ovarian complex.  Normal appearing left ovary, normal appearing appendix, normal  appearing liver.  DESCRIPTION OF PROCEDURE:  After adequate analgesia, the patient was placed in the dorsal lithotomy position.  She was sterilely prepped and draped.  Bladder was sterilely drained.  A sponge stick was placed in the vagina, 1 cm infraumbilical skin  incision was made.  A Veress needle was inserted.  The abdomen was insufflated dullness to percussion.  An 11 mm trocar was inserted.  Operative camera was inserted and the above findings were noted.  A 5 mm trocar was inserted left of the midline, 2  fingerbreadths above the pubic symphysis under direct visualization.  After careful and systematic evaluation of the abdomen and pelvis LigaSure cutting forceps were used to ligate across the right infundibulopelvic ligament, removing the right ovarian  mass from the  pelvic sidewall with the cyst intact.  Lysis of adhesions was carried out using LigaSure and countertraction across the vagina and the left ovarian sidewall, removing the filmy adhesions as well as epiploica adhesions to the pelvic sidewall  and the left ovary.  The left ovary was then grasped and elevated and the LigaSure instrument was used to ligate across the infundibulopelvic ligament.  Care taken to avoid the underlying bowel and underlying ureter with good hemostasis achieved and no  obvious injury to bowel, and bladder or ureter.  The 5 mm trocar was upgraded to a 10 mm.  An EndoCatch bag was inserted through this.  The right ovary and tube were placed in the EndoCatch bag and it was removed through the 10 mm port.  The left ovary  was then grasped and removed through the similar port.  After careful and systematic evaluation of the abdomen and pelvis good hemostasis was achieved.  No obvious injury to bowel or bladder.  The trocars were removed.  The abdomen was desufflated.  The  incisions were closed with 0 Vicryl interrupted sutures in the fascia at both ports.  The skin was then closed with 3-0 Vicryl Rapide in subcuticular fashion in both the ports and the incisions were injected with 0.25% Marcaine, total of 10 mL used.   Sponge stick removed and the patient was stable, transferred to Recovery Room.  Sponge and needle count was normal.  ESTIMATED BLOOD LOSS:  Minimal.  DISPOSITION:  The patient will be discharged home with followup in the office 2 to 3 weeks and help of the routine instruction sheet for laparoscopy.  I did send her  home with a prescription for Motrin and Percocet.  She did state that she can take  Percocet in limited numbers.  It does cause some itching, but she did want to have some pain medicine on hand.  She will call or return for increased pain or fever.  CN/NUANCE  D:12/29/2019 T:12/29/2019 JOB:[id number]

## 2019-12-29 NOTE — Discharge Instructions (Signed)
Post Anesthesia Home Care Instructions  Activity: Get plenty of rest for the remainder of the day. A responsible individual must stay with you for 24 hours following the procedure.  For the next 24 hours, DO NOT: -Drive a car -Paediatric nurse -Drink alcoholic beverages -Take any medication unless instructed by your physician -Make any legal decisions or sign important papers.  Meals: Start with liquid foods such as gelatin or soup. Progress to regular foods as tolerated. Avoid greasy, spicy, heavy foods. If nausea and/or vomiting occur, drink only clear liquids until the nausea and/or vomiting subsides. Call your physician if vomiting continues.  Special Instructions/Symptoms: Your throat may feel dry or sore from the anesthesia or the breathing tube placed in your throat during surgery. If this causes discomfort, gargle with warm salt water. The discomfort should disappear within 24 hours.  If you had a scopolamine patch placed behind your ear for the management of post- operative nausea and/or vomiting:  1. The medication in the patch is effective for 72 hours, after which it should be removed.  Wrap patch in a tissue and discard in the trash. Wash hands thoroughly with soap and water. 2. You may remove the patch earlier than 72 hours if you experience unpleasant side effects which may include dry mouth, dizziness or visual disturbances. 3. Avoid touching the patch. Wash your hands with soap and water after contact with the patch.    Remove patch behind right ear by Saturday, January 01, 2020.  DISCHARGE INSTRUCTIONS: Laparoscopy  The following instructions have been prepared to help you care for yourself upon your return home today.  Wound care: [name] Do not get the incision wet for the first 24 hours. The incision should be kept clean and dry. . The Band-Aids or dressings may be removed the day after surgery. . Should the incision become sore, red, and swollen after the first week,  check with your doctor.  Personal hygiene: . Shower the day after your procedure.  Activity and limitations: . Do NOT drive or operate any equipment today. . Do NOT lift anything more than 15 pounds for 2-3 weeks after surgery. . Do NOT rest in bed all day. . Walking is encouraged. Walk each day, starting slowly with 5-minute walks 3 or 4 times a day. Slowly increase the length of your walks. . Walk up and down stairs slowly. . Do NOT do strenuous activities, such as golfing, playing tennis, bowling, running, biking, weight lifting, gardening, mowing, or vacuuming for 2-4 weeks. Ask your doctor when it is okay to start.  Diet: Eat a light meal as desired this evening. You may resume your usual diet tomorrow.  Return to work: This is dependent on the type of work you do. For the most part you can return to a desk job within a week of surgery. If you are more active at work, please discuss this with your doctor.  What to expect after your surgery: You may have a slight burning sensation when you urinate on the first day. You may have a very small amount of blood in the urine. Expect to have a small amount of vaginal discharge/light bleeding for 1-2 weeks. It is not unusual to have abdominal soreness and bruising for up to 2 weeks. You may be tired and need more rest for about 1 week. You may experience shoulder pain for 24-72 hours. Lying flat in bed may relieve it.  Call your doctor for any of the following: . Develop a fever  of 100.4 or greater . Inability to urinate 6 hours after discharge from hospital . Severe pain not relieved by pain medications . Persistent of heavy bleeding at incision site . Redness or swelling around incision site after a week . Increasing nausea or vomiting

## 2019-12-29 NOTE — Transfer of Care (Signed)
Immediate Anesthesia Transfer of Care Note  Patient: Kathleen Key  Procedure(s) Performed: LAPAROSCOPIC BILATERAL OOPHORECTOMY WITH ENDOCATCH (Bilateral Abdomen)  Patient Location: PACU  Anesthesia Type:General  Level of Consciousness: awake, alert , oriented and patient cooperative  Airway & Oxygen Therapy: Patient Spontanous Breathing and Patient connected to nasal cannula oxygen  Post-op Assessment: Report given to RN, Post -op Vital signs reviewed and stable and Patient moving all extremities X 4  Post vital signs: Reviewed and stable  Last Vitals:  Vitals Value Taken Time  BP    Temp    Pulse    Resp    SpO2      Last Pain:  Vitals:   12/29/19 0613  TempSrc: Oral  PainSc: 0-No pain      Patients Stated Pain Goal: 5 (57/49/35 5217)  Complications: No complications documented.

## 2019-12-29 NOTE — Anesthesia Postprocedure Evaluation (Signed)
Anesthesia Post Note  Patient: Kathleen Key  Procedure(s) Performed: LAPAROSCOPIC BILATERAL OOPHORECTOMY WITH ENDOCATCH (Bilateral Abdomen)     Patient location during evaluation: PACU Anesthesia Type: General Level of consciousness: awake and alert Pain management: pain level controlled Vital Signs Assessment: post-procedure vital signs reviewed and stable Respiratory status: spontaneous breathing, nonlabored ventilation, respiratory function stable and patient connected to nasal cannula oxygen Cardiovascular status: blood pressure returned to baseline and stable Postop Assessment: no apparent nausea or vomiting Anesthetic complications: no   No complications documented.  Last Vitals:  Vitals:   12/29/19 0945 12/29/19 1040  BP: (!) 97/54 (!) 102/59  Pulse: 70 75  Resp: 14 16  Temp:  36.4 C  SpO2: 96% 100%    Last Pain:  Vitals:   12/29/19 1040  TempSrc:   PainSc: 3                  Kathleen Key

## 2019-12-29 NOTE — Anesthesia Procedure Notes (Signed)
Procedure Name: Intubation Date/Time: 12/29/2019 7:29 AM Performed by: Rogers Blocker, CRNA Pre-anesthesia Checklist: Patient identified, Emergency Drugs available, Suction available and Patient being monitored Patient Re-evaluated:Patient Re-evaluated prior to induction Oxygen Delivery Method: Circle System Utilized Preoxygenation: Pre-oxygenation with 100% oxygen Induction Type: IV induction Ventilation: Mask ventilation without difficulty Laryngoscope Size: Mac and 3 Grade View: Grade I Tube type: Oral Number of attempts: 1 Airway Equipment and Method: Stylet and Oral airway Placement Confirmation: ETT inserted through vocal cords under direct vision,  positive ETCO2 and breath sounds checked- equal and bilateral Secured at: 21 cm Tube secured with: Tape Dental Injury: Teeth and Oropharynx as per pre-operative assessment

## 2019-12-30 ENCOUNTER — Encounter (HOSPITAL_BASED_OUTPATIENT_CLINIC_OR_DEPARTMENT_OTHER): Payer: Self-pay | Admitting: Obstetrics and Gynecology

## 2019-12-30 LAB — SURGICAL PATHOLOGY

## 2019-12-31 ENCOUNTER — Telehealth (HOSPITAL_COMMUNITY): Payer: Self-pay

## 2019-12-31 NOTE — Telephone Encounter (Signed)
Patient called and stated that she has hives because of the adhesives from surgery that she had. She wanted to know if she can take Benadryl. Please review and advise. Thank you.

## 2019-12-31 NOTE — Telephone Encounter (Signed)
Relayed message

## 2019-12-31 NOTE — Telephone Encounter (Signed)
Yes she can use Benardyl prn rash/hives.

## 2020-01-06 ENCOUNTER — Other Ambulatory Visit: Payer: Self-pay

## 2020-01-06 ENCOUNTER — Ambulatory Visit
Admission: RE | Admit: 2020-01-06 | Discharge: 2020-01-06 | Disposition: A | Discharge status: Home / Self Care | Payer: 59 | Source: Ambulatory Visit | Attending: Internal Medicine | Admitting: Internal Medicine

## 2020-01-06 DIAGNOSIS — Z780 Asymptomatic menopausal state: Secondary | ICD-10-CM | POA: Diagnosis not present

## 2020-01-06 DIAGNOSIS — E2839 Other primary ovarian failure: Secondary | ICD-10-CM

## 2020-01-06 DIAGNOSIS — M85852 Other specified disorders of bone density and structure, left thigh: Secondary | ICD-10-CM | POA: Diagnosis not present

## 2020-01-06 MED FILL — SERTRALINE HCL 100 MG TAB: 100 | 30 days supply | Qty: 60 | Fill #2

## 2020-01-13 ENCOUNTER — Telehealth (INDEPENDENT_AMBULATORY_CARE_PROVIDER_SITE_OTHER): Payer: 59 | Admitting: Psychiatry

## 2020-01-13 ENCOUNTER — Other Ambulatory Visit: Payer: Self-pay

## 2020-01-13 ENCOUNTER — Other Ambulatory Visit (HOSPITAL_COMMUNITY): Payer: Self-pay | Admitting: Psychiatry

## 2020-01-13 DIAGNOSIS — F411 Generalized anxiety disorder: Secondary | ICD-10-CM

## 2020-01-13 DIAGNOSIS — F988 Other specified behavioral and emotional disorders with onset usually occurring in childhood and adolescence: Secondary | ICD-10-CM | POA: Diagnosis not present

## 2020-01-13 DIAGNOSIS — F330 Major depressive disorder, recurrent, mild: Secondary | ICD-10-CM | POA: Diagnosis not present

## 2020-01-13 MED ORDER — MYDAYIS 50 MG PO CP24
50.0000 mg | ORAL_CAPSULE | Freq: Every day | ORAL | 0 refills | Status: DC
Start: 2020-02-13 — End: 2020-03-16

## 2020-01-13 MED ORDER — SERTRALINE HCL 100 MG PO TABS: 200.0000 mg | ORAL_TABLET | Freq: Every day | ORAL | 2 refills | Status: DC

## 2020-01-13 MED ORDER — ARIPIPRAZOLE 2 MG PO TABS: 2.0000 mg | ORAL_TABLET | Freq: Every day | ORAL | 5 refills | Status: DC

## 2020-01-13 MED ORDER — CLONAZEPAM 1 MG PO TABS: 1.0000 mg | ORAL_TABLET | Freq: Every day | ORAL | 2 refills | Status: DC

## 2020-01-13 MED ORDER — MYDAYIS 50 MG PO CP24
50.0000 mg | ORAL_CAPSULE | Freq: Every day | ORAL | 0 refills | Status: DC
Start: 2020-01-13 — End: 2020-03-16

## 2020-01-13 MED FILL — ARIPiprazole 2 MG TABS: 2 | 30 days supply | Qty: 30 | Fill #0

## 2020-01-13 MED FILL — clonazePAM 1 MG TABS: 1 | 30 days supply | Qty: 30 | Fill #0

## 2020-01-13 NOTE — Progress Notes (Signed)
[facility] MD/PA/NP OP Progress Note  01/13/2020 3:15 PM Kathleen Key  MRN:  [id number] Interview was conducted by phone and I verified that I was speaking with the correct person using two identifiers. I discussed the limitations of evaluation and management by telemedicine and  the availability of in person appointments. Patient expressed understanding and agreed to proceed. Patient location - home; physician - home office.  Chief Complaint: Fatigue, insomnia.  HPI: 54yomarriedfemale with MDD/GAD and ADD.She was doing well on sertraline 200 mg and did not need lorazepam for anxiety often. [name] was also taking Adderall XR (last dose 40 mg) for ADD. 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 Adderall and Zoloft were held while risperidone 2 mg was added. She improved to the extent that she was ready for dc home on 12.11.20. Shehasimproved: no hallucinations, sleep is better, appetite normal, depression and anxiety minimal.We restarted sertraline,Adderall,discontinued risperidone.We switched Adderall to [name] and her focusing is considerably better longer. Still she reports some residual depression despite taking high dose of sertraline and fatigue despite being on a stimulant. Sleep was initially better with lorazepam but now she has difficulty initiating and maintaining sleep. I added aripiprazole 5 mg to augment sertraline but she could not tolerate GI side effects (nause, vomiting) so we decreased it to 2 mg which she tolerates well and mood improved further (takes it in the evening)..    Visit Diagnosis:    ICD-10-CM   1. ADD (attention deficit disorder) without hyperactivity  F98.8   2. GAD (generalized anxiety disorder)  F41.1   3. Major depressive disorder, recurrent episode, mild (HCC)  F33.0     Past Psychiatric History: Please see intake H&P.  Past Medical History:  Past Medical History:  Diagnosis Date  . ADD (attention  deficit disorder)   . Asthma, cough variant    pulmonology--- dr c. young  . GAD (generalized anxiety disorder)   . GERD (gastroesophageal reflux disease)   . History of 2019 novel coronavirus disease (COVID-19) 01/05/2019   per pt positive results done at [facility] , Dr Lavone Orn,  per pt mild symptoms resolved in one week  . History of urinary retention 08/2012   due to uterine retroverted s/p retroversion and hysterectomy  . History of uterine fibroid   . Irritable bowel syndrome with constipation   . MDD (major depressive disorder)   . Ovarian mass   . Pelvic pain   . Pulmonary sarcoidosis ([place]) 2014   followed by dr c. young-- dx 2014 lung nodules,  clinically inactive and stable  . Shortness of breath dyspnea     Past Surgical History:  Procedure Laterality Date  . ABDOMINAL HYSTERECTOMY N/A 10/13/2012   Procedure: HYSTERECTOMY ABDOMINAL;  Surgeon: Jonnie Kind, MD;  Location: [facility];  Service: Gynecology;  Laterality: N/A;  . BILATERAL SALPINGECTOMY Bilateral 10/13/2012   Procedure: BILATERAL SALPINGECTOMY;  Surgeon: Jonnie Kind, MD;  Location: [facility];  Service: Gynecology;  Laterality: Bilateral;  . BREAST EXCISIONAL BIOPSY Left 05/2015  . BREAST LUMPECTOMY WITH RADIOACTIVE SEED LOCALIZATION Left 07/28/2015   Procedure: LEFT BREAST LUMPECTOMY WITH RADIOACTIVE SEED LOCALIZATION;  Surgeon: Autumn Messing III, MD;  Location: [facility];  Service: General;  Laterality: Left;  . LAPAROSCOPIC BILATERAL SALPINGO OOPHERECTOMY Bilateral 12/29/2019   Procedure: LAPAROSCOPIC BILATERAL OOPHORECTOMY WITH ENDOCATCH;  Surgeon: Louretta Shorten, MD;  Location: [facility];  Service: Gynecology;  Laterality: Bilateral;  . ULNAR NERVE TRANSPOSITION Left  05/2019  . WISDOM TOOTH EXTRACTION     Dr. Georgina Snell office-[facility], [place], [place]    Family Psychiatric History: None.  Family History:  Family History  Problem Relation Age of Onset  . Heart disease  Mother        leaky valve  . Asthma Mother   . Hypertension Father        pacemaker  . Stroke Father   . Heart disease Father        pacemaker  . Fibromyalgia Sister   . Neuropathy Sister     Social History:  Social History   Socioeconomic History  . Marital status: Married    Spouse name: Not on file  . Number of children: Not on file  . Years of education: Not on file  . Highest education level: Not on file  Occupational History  . Not on file  Tobacco Use  . Smoking status: Never Smoker  . Smokeless tobacco: Never Used  Vaping Use  . Vaping Use: Never used  Substance and Sexual Activity  . Alcohol use: Yes    Comment: occassional  . Drug use: Never  . Sexual activity: Yes    Birth control/protection: Surgical  Other Topics Concern  . Not on file  Social History Narrative  . Not on file   Social Determinants of Health   Financial Resource Strain:   . Difficulty of Paying Living Expenses: Not on file  Food Insecurity:   . Worried About Charity fundraiser in the Last Year: Not on file  . Ran Out of Food in the Last Year: Not on file  Transportation Needs:   . Lack of Transportation (Medical): Not on file  . Lack of Transportation (Non-Medical): Not on file  Physical Activity:   . Days of Exercise per Week: Not on file  . Minutes of Exercise per Session: Not on file  Stress:   . Feeling of Stress : Not on file  Social Connections:   . Frequency of Communication with Friends and Family: Not on file  . Frequency of Social Gatherings with Friends and Family: Not on file  . Attends Religious Services: Not on file  . Active Member of Clubs or Organizations: Not on file  . Attends Archivist Meetings: Not on file  . Marital Status: Not on file    Allergies:  Allergies  Allergen Reactions  . Dilaudid [Hydromorphone Hcl] Itching  . Excedrin Back & [Acetaminophen-Aspirin Buffered] Other (See Comments)    SUGAR-SPIKE, NERVOUSNESS  . Oxycodone  Itching    percocet  . Tramadol Itching    Metabolic Disorder Labs: Lab Results  Component Value Date   HGBA1C 5.6 03/07/2019   MPG 114.02 03/07/2019   No results found for: PROLACTIN Lab Results  Component Value Date   CHOL 194 03/07/2019   TRIG 78 03/07/2019   HDL 74 03/07/2019   CHOLHDL 2.6 03/07/2019   VLDL 16 03/07/2019   LDLCALC 104 (H) 03/07/2019   Lab Results  Component Value Date   TSH 2.368 03/07/2019    Therapeutic Level Labs: No results found for: LITHIUM No results found for: VALPROATE No components found for:  CBMZ  Current Medications: Current Outpatient Medications  Medication Sig Dispense Refill  . albuterol (VENTOLIN HFA) 108 (90 Base) MCG/ACT inhaler Inhale 2 puffs every 6 hours if needed for breathing 18 g 12  . [START ON 02/13/2020] Amphet-Dextroamphet 3-Bead ER (MYDAYIS) 50 MG CP24 Take 50 mg by mouth daily. 30 capsule  0  . Amphet-Dextroamphet 3-Bead ER (MYDAYIS) 50 MG CP24 Take 50 mg by mouth daily. 30 capsule 0  . ARIPiprazole (ABILIFY) 2 MG tablet Take 1 tablet (2 mg total) by mouth daily at 6 PM. 30 tablet 5  . B Complex CAPS Take by mouth daily.    . clonazePAM (KLONOPIN) 1 MG tablet Take 1 tablet (1 mg total) by mouth at bedtime. 30 tablet 2  . Dietary Management Product (ENLYTE) CAPS Take 1 capsule by mouth daily at 12 noon. May substitute Enbrace HR if covered  May get from [email or web address] if neither are  covered (Patient not taking: Reported on 11/11/2019) 30 capsule 11  . EQ Fiber Supplement 2 g CHEW Chew by mouth in the morning and at bedtime. Takes 1/4 of chew bid    . estradiol (ESTRACE) 1 MG tablet Take 1 mg by mouth daily.    [name] FAMOTIDINE PO Take by mouth.    [name] ibuprofen (ADVIL) 600 MG tablet Take 1 tablet (600 mg total) by mouth every 6 (six) hours as needed for mild pain or moderate pain. 20 tablet 0  . loratadine (CLARITIN) 10 MG tablet Take 10 mg by mouth in the morning and at bedtime.    . melatonin 5 MG TABS Take 5 mg by mouth at  bedtime.    . Multiple Vitamins-Minerals (EYE VITAMINS) CAPS Take by mouth daily.    [name] oxyCODONE (ROXICODONE) 5 MG immediate release tablet Take 1 tablet (5 mg total) by mouth every 4 (four) hours as needed for severe pain. 10 tablet 0  . polyethylene glycol (MIRALAX / GLYCOLAX) packet Take 17 g by mouth 3 (three) times daily as needed. 14 each 0  . sertraline (ZOLOFT) 100 MG tablet Take 2 tablets (200 mg total) by mouth at bedtime. Take it at dinner time. 60 tablet 2   No current facility-administered medications for this visit.    Psychiatric Specialty Exam: Review of Systems  Constitutional: Positive for fatigue.  Psychiatric/Behavioral: Positive for sleep disturbance.  All other systems reviewed and are negative.   Last menstrual period 10/08/2012.There is no height or weight on file to calculate BMI.  General Appearance: NA  Eye Contact:  NA  Speech:  Clear and Coherent and Normal Rate  Volume:  Normal  Mood:  mild depression  Affect:  NA  Thought Process:  Goal Directed and Linear  Orientation:  Full (Time, Place, and Person)  Thought Content: Logical   Suicidal Thoughts:  No  Homicidal Thoughts:  No  Memory:  Immediate;   Good Recent;   Good Remote;   Good  Judgement:  Good  Insight:  Fair  Psychomotor Activity:  NA  Concentration:  Concentration: Fair  Recall:  Good  Fund of Knowledge: Good  Language: Good  Akathisia:  Negative  Handed:  Right  AIMS (if indicated): not done  Assets:  Communication Skills Desire for Improvement Financial Resources/Insurance Housing Resilience Social Support  ADL's:  Intact  Cognition: WNL  Sleep:  Fair   Screenings: AIMS     Admission (Discharged) from OP Visit from 03/05/2019 in [place] [facility]  AIMS Total Score 0    AUDIT     Admission (Discharged) from OP Visit from 03/05/2019 in [place] [facility]  Alcohol Use Disorder Identification Test Final Score (AUDIT) 4        Assessment and Plan: 54yomarriedfemale with MDD/GAD and ADD.She was doing well on sertraline 200 mg and did not need  lorazepam for anxiety often. Raphael was also taking Adderall XR (last dose 40 mg) for ADD. 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 Adderall and Zoloft were held while risperidone 2 mg was added. She improved to the extent that she was ready for dc home on 12.11.20. Shehasimproved: no hallucinations, sleep is better, appetite normal, depression and anxiety minimal.We restarted sertraline,Adderall,discontinued risperidone.We switched Adderall to [place] and her focusing is considerably better longer. Still she reports some residual depression despite taking high dose of sertraline and fatigue despite being on a stimulant. Sleep was initially better with lorazepam but now she has difficulty initiating and maintaining sleep. I added aripiprazole 5 mg to augment sertraline but she could not tolerate GI side effects (nause, vomiting) so we decreased it to 2 mg which she tolerates well and mood improved further (takes it in the evening).[name]   Dx:MDD recurrent mild;ADD; GAD;  Plan:Continuesertraline200mg  daily at dinner time together with 2 mg of aripiprazole and Mydayis 50 mg in AM.We will dc lorazepam and try clonazepam 1 mg at HS for sleep. Next appointment in6 weeks. The plan was discussed with patientwho had an opportunity to ask questions and these were all answered. I spend63minutes inphone consultation with the patient.    Stephanie Acre, MD 01/13/2020, 3:15 PM

## 2020-01-14 ENCOUNTER — Other Ambulatory Visit (HOSPITAL_COMMUNITY): Payer: Self-pay | Admitting: Obstetrics and Gynecology

## 2020-01-14 MED FILL — ESTRADIOL 2 MG TABS: 2 | 30 days supply | Qty: 30 | Fill #0

## 2020-01-21 DIAGNOSIS — H52203 Unspecified astigmatism, bilateral: Secondary | ICD-10-CM | POA: Diagnosis not present

## 2020-01-27 MED FILL — MYDAYIS ER 50 MG CAPSULE: 50 | 30 days supply | Qty: 30 | Fill #0

## 2020-02-07 MED FILL — SERTRALINE HCL 100 MG TAB: 100 | 30 days supply | Qty: 60 | Fill #0

## 2020-02-10 ENCOUNTER — Telehealth: Payer: Self-pay | Admitting: Internal Medicine

## 2020-02-10 NOTE — Telephone Encounter (Signed)
ATC Patient to schedule Pneumovax 23.  LM to call back to schedule.

## 2020-02-10 NOTE — Telephone Encounter (Signed)
Appropriate for Sadae to get Pneumovax-23

## 2020-02-10 NOTE — Telephone Encounter (Signed)
Patient called requesting pneumonia vaccine. No pneumonia vaccines in the past.  Message routed to Dr. Annamaria Boots to advise for pneumovax 23

## 2020-02-11 NOTE — Telephone Encounter (Signed)
Patient returned call.  Patient scheduled 03/10/20 at 1630 for pneumovax 23.

## 2020-02-11 NOTE — Telephone Encounter (Signed)
ATC Patient to schedule pneumonia  vaccine. LM to call back.

## 2020-02-14 MED FILL — ARIPiprazole 2 MG TABS: 2 | 30 days supply | Qty: 30 | Fill #1

## 2020-02-15 MED FILL — clonazePAM 1 MG TABS: 1 | 30 days supply | Qty: 30 | Fill #1

## 2020-02-22 ENCOUNTER — Other Ambulatory Visit: Payer: Self-pay

## 2020-02-22 ENCOUNTER — Telehealth (HOSPITAL_COMMUNITY): Payer: 59 | Admitting: Psychiatry

## 2020-02-28 MED FILL — MYDAYIS ER 50 MG CAPSULE: 50 | 30 days supply | Qty: 30 | Fill #0

## 2020-02-28 MED FILL — ESTRADIOL 2 MG TABS: 2 | 30 days supply | Qty: 30 | Fill #1

## 2020-03-08 MED FILL — SERTRALINE HCL 100 MG TAB: 100 | 30 days supply | Qty: 60 | Fill #1

## 2020-03-10 ENCOUNTER — Other Ambulatory Visit: Payer: Self-pay

## 2020-03-10 ENCOUNTER — Ambulatory Visit (INDEPENDENT_AMBULATORY_CARE_PROVIDER_SITE_OTHER): Payer: 59

## 2020-03-10 DIAGNOSIS — Z23 Encounter for immunization: Secondary | ICD-10-CM

## 2020-03-14 MED FILL — clonazePAM 1 MG TABS: 1 | 30 days supply | Qty: 30 | Fill #2

## 2020-03-14 MED FILL — ARIPiprazole 2 MG TABS: 2 | 30 days supply | Qty: 30 | Fill #2

## 2020-03-16 ENCOUNTER — Telehealth (INDEPENDENT_AMBULATORY_CARE_PROVIDER_SITE_OTHER): Payer: 59 | Admitting: Psychiatry

## 2020-03-16 ENCOUNTER — Other Ambulatory Visit (HOSPITAL_COMMUNITY): Payer: Self-pay | Admitting: Psychiatry

## 2020-03-16 ENCOUNTER — Other Ambulatory Visit: Payer: Self-pay

## 2020-03-16 DIAGNOSIS — F330 Major depressive disorder, recurrent, mild: Secondary | ICD-10-CM

## 2020-03-16 DIAGNOSIS — F988 Other specified behavioral and emotional disorders with onset usually occurring in childhood and adolescence: Secondary | ICD-10-CM

## 2020-03-16 DIAGNOSIS — F411 Generalized anxiety disorder: Secondary | ICD-10-CM

## 2020-03-16 MED ORDER — ESZOPICLONE 2 MG PO TABS: 2.0000 mg | ORAL_TABLET | Freq: Every evening | ORAL | 1 refills | Status: DC | PRN

## 2020-03-16 MED ORDER — SERTRALINE HCL 100 MG PO TABS: 200.0000 mg | ORAL_TABLET | Freq: Every day | ORAL | 2 refills | Status: DC

## 2020-03-16 MED ORDER — MYDAYIS 50 MG PO CP24
50.0000 mg | ORAL_CAPSULE | Freq: Every day | ORAL | 0 refills | Status: DC
Start: 2020-03-17 — End: 2020-05-18

## 2020-03-16 MED ORDER — MYDAYIS 50 MG PO CP24
50.0000 mg | ORAL_CAPSULE | Freq: Every day | ORAL | 0 refills | Status: DC
Start: 2020-04-17 — End: 2020-05-18

## 2020-03-16 MED FILL — ESZOPICLONE 2 MG TAB: 2 | 30 days supply | Qty: 30 | Fill #0

## 2020-03-16 NOTE — Progress Notes (Signed)
[facility] MD/PA/NP OP Progress Note  03/16/2020 4:11 PM Kathleen Key  MRN:  [id number] Interview was conducted by phone and I verified that I was speaking with the correct person using two identifiers. I discussed the limitations of evaluation and management by telemedicine and  the availability of in person appointments. Patient expressed understanding and agreed to proceed. Participants in the visit: patient (location - home); physician (location - home office).  Chief Complaint: Middle insomnia.  HPI: 54yomarriedfemale with MDD/GAD and ADD.She was doing well on sertraline 200 mg and did not need lorazepam for anxiety often. Kathleen Key was also taking Adderall XR (last dose 40 mg) for ADD. Sheworks [employer].In December 2020 she was admitted [facility] with dx of MDD severe. Adderall and Zoloft were held while risperidone 2 mg was added. She improved to the extent that she was ready for dc home on 12.11.20. Shehasimproved: no hallucinations, sleep is better, appetite normal, depression and anxiety minimal.We restarted sertraline,Adderall,discontinued risperidone.We switched Adderall to [name] and her focusing is considerably better longer. Still she reports some residual depression despite taking high dose of sertraline and fatigue despite being on a stimulant. Sleep was initially better with lorazepam but now she has difficulty initiating and maintaining sleep.Clonazepam is only marginally more effective.  I added aripiprazole 5 mg to augment sertraline but she could not tolerate GI side effects (nause, vomiting) so we decreased it to 2 mg which she tolerates well and mood improved further (takes it in the evening)..   Visit Diagnosis:    ICD-10-CM   1. Major depressive disorder, recurrent episode, mild (HCC)  F33.0   2. GAD (generalized anxiety disorder)  F41.1   3. ADD (attention deficit disorder) without hyperactivity  F98.8     Past Psychiatric History: Please see intake  H&P.  Past Medical History:  Past Medical History:  Diagnosis Date  . ADD (attention deficit disorder)   . Asthma, cough variant    pulmonology--- dr c. young  . GAD (generalized anxiety disorder)   . GERD (gastroesophageal reflux disease)   . History of 2019 novel coronavirus disease (COVID-19) 01/05/2019   per pt positive results done at [facility] , Dr Lavone Orn,  per pt mild symptoms resolved in one week  . History of urinary retention 08/2012   due to uterine retroverted s/p retroversion and hysterectomy  . History of uterine fibroid   . Irritable bowel syndrome with constipation   . MDD (major depressive disorder)   . Ovarian mass   . Pelvic pain   . Pulmonary sarcoidosis ([place]) 2014   followed by dr c. young-- dx 2014 lung nodules,  clinically inactive and stable  . Shortness of breath dyspnea     Past Surgical History:  Procedure Laterality Date  . ABDOMINAL HYSTERECTOMY N/A 10/13/2012   Procedure: HYSTERECTOMY ABDOMINAL;  Surgeon: Jonnie Kind, MD;  Location: [facility];  Service: Gynecology;  Laterality: N/A;  . BILATERAL SALPINGECTOMY Bilateral 10/13/2012   Procedure: BILATERAL SALPINGECTOMY;  Surgeon: Jonnie Kind, MD;  Location: [facility];  Service: Gynecology;  Laterality: Bilateral;  . BREAST EXCISIONAL BIOPSY Left 05/2015  . BREAST LUMPECTOMY WITH RADIOACTIVE SEED LOCALIZATION Left 07/28/2015   Procedure: LEFT BREAST LUMPECTOMY WITH RADIOACTIVE SEED LOCALIZATION;  Surgeon: Autumn Messing III, MD;  Location: [facility];  Service: General;  Laterality: Left;  . LAPAROSCOPIC BILATERAL SALPINGO OOPHERECTOMY Bilateral 12/29/2019   Procedure: LAPAROSCOPIC BILATERAL OOPHORECTOMY WITH ENDOCATCH;  Surgeon: Louretta Shorten, MD;  Location: [facility];  Service: Gynecology;  Laterality: Bilateral;  . ULNAR NERVE TRANSPOSITION Left 05/2019  . WISDOM TOOTH EXTRACTION     Dr. Georgina Snell office-[facility], [place], [place]    Family Psychiatric History:  None.  Family History:  Family History  Problem Relation Age of Onset  . Heart disease Mother        leaky valve  . Asthma Mother   . Hypertension Father        pacemaker  . Stroke Father   . Heart disease Father        pacemaker  . Fibromyalgia Sister   . Neuropathy Sister     Social History:  Social History   Socioeconomic History  . Marital status: Married    Spouse name: Not on file  . Number of children: Not on file  . Years of education: Not on file  . Highest education level: Not on file  Occupational History  . Not on file  Tobacco Use  . Smoking status: Never Smoker  . Smokeless tobacco: Never Used  Vaping Use  . Vaping Use: Never used  Substance and Sexual Activity  . Alcohol use: Yes    Comment: occassional  . Drug use: Never  . Sexual activity: Yes    Birth control/protection: Surgical  Other Topics Concern  . Not on file  Social History Narrative  . Not on file   Social Determinants of Health   Financial Resource Strain: Not on file  Food Insecurity: Not on file  Transportation Needs: Not on file  Physical Activity: Not on file  Stress: Not on file  Social Connections: Not on file    Allergies:  Allergies  Allergen Reactions  . Dilaudid [Hydromorphone Hcl] Itching  . Excedrin Back & [Acetaminophen-Aspirin Buffered] Other (See Comments)    SUGAR-SPIKE, NERVOUSNESS  . Oxycodone Itching    percocet  . Tramadol Itching    Metabolic Disorder Labs: Lab Results  Component Value Date   HGBA1C 5.6 03/07/2019   MPG 114.02 03/07/2019   No results found for: PROLACTIN Lab Results  Component Value Date   CHOL 194 03/07/2019   TRIG 78 03/07/2019   HDL 74 03/07/2019   CHOLHDL 2.6 03/07/2019   VLDL 16 03/07/2019   LDLCALC 104 (H) 03/07/2019   Lab Results  Component Value Date   TSH 2.368 03/07/2019    Therapeutic Level Labs: No results found for: LITHIUM No results found for: VALPROATE No components found for:  CBMZ  Current  Medications: Current Outpatient Medications  Medication Sig Dispense Refill  . albuterol (VENTOLIN HFA) 108 (90 Base) MCG/ACT inhaler Inhale 2 puffs every 6 hours if needed for breathing 18 g 12  . [START ON 04/17/2020] Amphet-Dextroamphet 3-Bead ER (MYDAYIS) 50 MG CP24 Take 50 mg by mouth daily. 30 capsule 0  . [START ON 03/17/2020] Amphet-Dextroamphet 3-Bead ER (MYDAYIS) 50 MG CP24 Take 50 mg by mouth daily. 30 capsule 0  . ARIPiprazole (ABILIFY) 2 MG tablet Take 1 tablet (2 mg total) by mouth daily at 6 PM. 30 tablet 5  . B Complex CAPS Take by mouth daily.    . clonazePAM (KLONOPIN) 1 MG tablet Take 1 tablet (1 mg total) by mouth at bedtime. 30 tablet 2  . estradiol (ESTRACE) 1 MG tablet Take by mouth daily. Taking 2mg     . eszopiclone (LUNESTA) 2 MG TABS tablet Take 1 tablet (2 mg total) by mouth at bedtime as needed for sleep. Take immediately before bedtime 30 tablet 1  . FAMOTIDINE PO Take by mouth.    [name]  loratadine (CLARITIN) 10 MG tablet Take 10 mg by mouth in the morning and at bedtime.    . melatonin 5 MG TABS Take 5 mg by mouth at bedtime.    . Multiple Vitamins-Minerals (EYE VITAMINS) CAPS Take by mouth daily.    [name] oxyCODONE (ROXICODONE) 5 MG immediate release tablet Take 1 tablet (5 mg total) by mouth every 4 (four) hours as needed for severe pain. 10 tablet 0  . polyethylene glycol (MIRALAX / GLYCOLAX) packet Take 17 g by mouth 3 (three) times daily as needed. 14 each 0  . [START ON 04/12/2020] sertraline (ZOLOFT) 100 MG tablet Take 2 tablets (200 mg total) by mouth at bedtime. Take it at dinner time. 60 tablet 2   No current facility-administered medications for this visit.     Psychiatric Specialty Exam: Review of Systems  Psychiatric/Behavioral: Positive for sleep disturbance.  All other systems reviewed and are negative.   Last menstrual period 10/08/2012.There is no height or weight on file to calculate BMI.  General Appearance: NA  Eye Contact:  NA  Speech:  Clear  and Coherent and Normal Rate  Volume:  Normal  Mood:  Some depression.  Affect:  NA  Thought Process:  Goal Directed and Linear  Orientation:  Full (Time, Place, and Person)  Thought Content: Logical   Suicidal Thoughts:  No  Homicidal Thoughts:  No  Memory:  Immediate;   Good Recent;   Good Remote;   Good  Judgement:  Good  Insight:  Fair  Psychomotor Activity:  NA  Concentration:  Concentration: Good  Recall:  Good  Fund of Knowledge: Good  Language: Good  Akathisia:  Negative  Handed:  Right  AIMS (if indicated): not done  Assets:  Communication Skills Desire for Improvement Financial Resources/Insurance Housing Social Support Talents/Skills  ADL's:  Intact  Cognition: WNL  Sleep:  Fair   Screenings: AIMS   Flowsheet Row Admission (Discharged) from OP Visit from 03/05/2019 in [facility] [facility]  AIMS Total Score 0    AUDIT   Flowsheet Row Admission (Discharged) from OP Visit from 03/05/2019 in [employer] [facility]  Alcohol Use Disorder Identification Test Final Score (AUDIT) 4       Assessment and Plan: 54yomarriedfemale with MDD/GAD and ADD.She was doing well on sertraline 200 mg and did not need lorazepam for anxiety often. Kahliya was also taking Adderall XR (last dose 40 mg) for ADD. Sheworks [employer].In December 2020 she was admitted [facility] with dx of MDD severe. Adderall and Zoloft were held while risperidone 2 mg was added. She improved to the extent that she was ready for dc home on 12.11.20. Shehasimproved: no hallucinations, sleep is better, appetite normal, depression and anxiety minimal.We restarted sertraline,Adderall,discontinued risperidone.We switched Adderall to [place] and her focusing is considerably better longer. Still she reports some residual depression despite taking high dose of sertraline and fatigue despite being on a stimulant. Sleep was initially better with lorazepam  but now she has difficulty initiating and maintaining sleep.Clonazepam is only marginally more effective.  I added aripiprazole 5 mg to augment sertraline but she could not tolerate GI side effects (nause, vomiting) so we decreased it to 2 mg which she tolerates well and mood improved further (takes it in the evening).[name]  Dx:MDD recurrent mild;ADD; GAD;  Plan:Continuesertraline200mg  daily at dinner time together with 2 mg of aripiprazole and Mydayis 50 mg in AM.Wewill dc clonazepam 1 mg and instead try eszopiclone 2 mg at HS  for sleep. Next appointment in8 weeks. The plan was discussed with patientwho had an opportunity to ask questions and these were all answered. I spend61minutes inphone consultation with the patient.   Stephanie Acre, MD 03/16/2020, 4:11 PM

## 2020-03-27 DIAGNOSIS — Z23 Encounter for immunization: Secondary | ICD-10-CM | POA: Diagnosis not present

## 2020-03-29 MED FILL — ESTRADIOL 2 MG TABS: 2 | 30 days supply | Qty: 30 | Fill #2

## 2020-03-29 MED FILL — MYDAYIS ER 50 MG CAPSULE: 50 | 30 days supply | Qty: 30 | Fill #0

## 2020-04-06 MED FILL — SERTRALINE HCL 100 MG TAB: 100 | 30 days supply | Qty: 60 | Fill #2

## 2020-04-13 ENCOUNTER — Other Ambulatory Visit (HOSPITAL_COMMUNITY): Payer: Self-pay | Admitting: Psychiatry

## 2020-04-13 ENCOUNTER — Telehealth (HOSPITAL_COMMUNITY): Payer: Self-pay | Admitting: *Deleted

## 2020-04-13 MED ORDER — CLONAZEPAM 1 MG PO TABS: 1.0000 mg | ORAL_TABLET | Freq: Every day | ORAL | 2 refills | Status: DC

## 2020-04-13 MED FILL — ARIPiprazole 2 MG TABS: 2 | 30 days supply | Qty: 30 | Fill #3

## 2020-04-13 MED FILL — clonazePAM 1 MG TABS: 1 | 30 days supply | Qty: 30 | Fill #0

## 2020-04-13 NOTE — Telephone Encounter (Signed)
Pt called requesting refill of the Klonopin 1 mg, last scripted on 01/13/20 with two fills. Pt has an upcoming appointment on 05/18/20. Thanks.

## 2020-04-13 NOTE — Telephone Encounter (Signed)
Done

## 2020-05-01 MED FILL — MYDAYIS ER 50 MG CAPSULE: 50 | 30 days supply | Qty: 30 | Fill #0

## 2020-05-01 MED FILL — ESTRADIOL 2 MG TABS: 2 | 30 days supply | Qty: 30 | Fill #3

## 2020-05-08 ENCOUNTER — Other Ambulatory Visit (HOSPITAL_COMMUNITY): Payer: Self-pay | Admitting: Psychiatry

## 2020-05-08 MED FILL — SERTRALINE HCL 100 MG TAB: 100 | 30 days supply | Qty: 60 | Fill #0

## 2020-05-11 MED FILL — ARIPiprazole 2 MG TABS: 2 | 30 days supply | Qty: 30 | Fill #4

## 2020-05-16 MED FILL — ESZOPICLONE 2 MG TAB: 2 | 30 days supply | Qty: 30 | Fill #0

## 2020-05-18 ENCOUNTER — Telehealth (INDEPENDENT_AMBULATORY_CARE_PROVIDER_SITE_OTHER): Payer: 59 | Admitting: Psychiatry

## 2020-05-18 ENCOUNTER — Other Ambulatory Visit (HOSPITAL_COMMUNITY): Payer: Self-pay | Admitting: Psychiatry

## 2020-05-18 ENCOUNTER — Other Ambulatory Visit: Payer: Self-pay

## 2020-05-18 DIAGNOSIS — F988 Other specified behavioral and emotional disorders with onset usually occurring in childhood and adolescence: Secondary | ICD-10-CM

## 2020-05-18 DIAGNOSIS — F411 Generalized anxiety disorder: Secondary | ICD-10-CM

## 2020-05-18 DIAGNOSIS — F330 Major depressive disorder, recurrent, mild: Secondary | ICD-10-CM

## 2020-05-18 MED ORDER — ESZOPICLONE 2 MG PO TABS
2.0000 mg | ORAL_TABLET | Freq: Every evening | ORAL | 1 refills | Status: DC | PRN
Start: 2020-06-13 — End: 2020-06-13

## 2020-05-18 MED ORDER — MYDAYIS 50 MG PO CP24
50.0000 mg | ORAL_CAPSULE | Freq: Every day | ORAL | 0 refills | Status: DC
Start: 2020-06-15 — End: 2020-08-03

## 2020-05-18 MED ORDER — MYDAYIS 50 MG PO CP24
50.0000 mg | ORAL_CAPSULE | Freq: Every day | ORAL | 0 refills | Status: DC
Start: 2020-05-18 — End: 2020-06-29

## 2020-05-18 NOTE — Progress Notes (Signed)
BH MD/PA/NP OP Progress Note  05/18/2020 4:12 PM Kathleen Key  MRN:  [id number] Interview was conducted by phone and I verified that I was speaking with the correct person using two identifiers. I discussed the limitations of evaluation and management by telemedicine and  the availability of in person appointments. Patient expressed understanding and agreed to proceed. Participants in the visit: patient (location - home); physician (location - home office).  Chief Complaint: Problems with sleep.  HPI: 54yomarriedfemale with MDD/GAD and ADD.She was doing well on sertraline 200 mg and did not need lorazepam for anxiety often. Kathleen Key was also taking Adderall XR (last dose 40 mg) for ADD. Sheworks [employer].In December 2020 she was admitted [facility] with dx of MDD severe. Adderall and Zoloft were held while risperidone 2 mg was added. She improved to the extent that she was ready for dc home on 12.11.20. Shehasimproved: no hallucinations, sleep is better, appetite normal, depression and anxiety minimal.We restarted sertraline,Adderall,discontinued risperidone.We switched Adderall to [place] and her focusing is considerably better longer. Still she reports some residual depression despite taking high dose of sertralineand fatigue despite being on a stimulant. Sleepwasinitiallybetter with lorazepambut now she has difficulty initiating and maintaining sleep. Clonazepam is only marginally more effective. I added aripiprazole 5 mg to augment sertraline but she could not tolerate GI side effects (nause, vomiting) so we decreased it to 2 mg which she tolerates well and mood improved further (takes it in the evening). She was supposed to try eszopiclone 2 mg for insomnia but has not started it yet.    Visit Diagnosis:    ICD-10-CM   1. Major depressive disorder, recurrent episode, mild (HCC)  F33.0   2. GAD (generalized anxiety disorder)  F41.1   3. ADD (attention deficit disorder)  without hyperactivity  F98.8     Past Psychiatric History: Please see intake H&P.  Past Medical History:  Past Medical History:  Diagnosis Date  . ADD (attention deficit disorder)   . Asthma, cough variant    pulmonology--- dr c. young  . GAD (generalized anxiety disorder)   . GERD (gastroesophageal reflux disease)   . History of 2019 novel coronavirus disease (COVID-19) 01/05/2019   per pt positive results done at [facility] , Dr Lavone Orn,  per pt mild symptoms resolved in one week  . History of urinary retention 08/2012   due to uterine retroverted s/p retroversion and hysterectomy  . History of uterine fibroid   . Irritable bowel syndrome with constipation   . MDD (major depressive disorder)   . Ovarian mass   . Pelvic pain   . Pulmonary sarcoidosis ([place]) 2014   followed by dr c. young-- dx 2014 lung nodules,  clinically inactive and stable  . Shortness of breath dyspnea     Past Surgical History:  Procedure Laterality Date  . ABDOMINAL HYSTERECTOMY N/A 10/13/2012   Procedure: HYSTERECTOMY ABDOMINAL;  Surgeon: Jonnie Kind, MD;  Location: [facility];  Service: Gynecology;  Laterality: N/A;  . BILATERAL SALPINGECTOMY Bilateral 10/13/2012   Procedure: BILATERAL SALPINGECTOMY;  Surgeon: Jonnie Kind, MD;  Location: [facility];  Service: Gynecology;  Laterality: Bilateral;  . BREAST EXCISIONAL BIOPSY Left 05/2015  . BREAST LUMPECTOMY WITH RADIOACTIVE SEED LOCALIZATION Left 07/28/2015   Procedure: LEFT BREAST LUMPECTOMY WITH RADIOACTIVE SEED LOCALIZATION;  Surgeon: Autumn Messing III, MD;  Location: [place];  Service: General;  Laterality: Left;  . LAPAROSCOPIC BILATERAL SALPINGO OOPHERECTOMY Bilateral 12/29/2019   Procedure: LAPAROSCOPIC BILATERAL OOPHORECTOMY WITH ENDOCATCH;  Surgeon: Louretta Shorten, MD;  Location: [facility];  Service: Gynecology;  Laterality: Bilateral;  . ULNAR NERVE TRANSPOSITION Left 05/2019  . WISDOM TOOTH EXTRACTION     Dr. Georgina Snell  office-[facility], [place], [place]    Family Psychiatric History: None.  Family History:  Family History  Problem Relation Age of Onset  . Heart disease Mother        leaky valve  . Asthma Mother   . Hypertension Father        pacemaker  . Stroke Father   . Heart disease Father        pacemaker  . Fibromyalgia Sister   . Neuropathy Sister     Social History:  Social History   Socioeconomic History  . Marital status: Married    Spouse name: Not on file  . Number of children: Not on file  . Years of education: Not on file  . Highest education level: Not on file  Occupational History  . Not on file  Tobacco Use  . Smoking status: Never Smoker  . Smokeless tobacco: Never Used  Vaping Use  . Vaping Use: Never used  Substance and Sexual Activity  . Alcohol use: Yes    Comment: occassional  . Drug use: Never  . Sexual activity: Yes    Birth control/protection: Surgical  Other Topics Concern  . Not on file  Social History Narrative  . Not on file   Social Determinants of Health   Financial Resource Strain: Not on file  Food Insecurity: Not on file  Transportation Needs: Not on file  Physical Activity: Not on file  Stress: Not on file  Social Connections: Not on file    Allergies:  Allergies  Allergen Reactions  . Dilaudid [Hydromorphone Hcl] Itching  . Excedrin Back & [Acetaminophen-Aspirin Buffered] Other (See Comments)    SUGAR-SPIKE, NERVOUSNESS  . Oxycodone Itching    percocet  . Tramadol Itching    Metabolic Disorder Labs: Lab Results  Component Value Date   HGBA1C 5.6 03/07/2019   MPG 114.02 03/07/2019   No results found for: PROLACTIN Lab Results  Component Value Date   CHOL 194 03/07/2019   TRIG 78 03/07/2019   HDL 74 03/07/2019   CHOLHDL 2.6 03/07/2019   VLDL 16 03/07/2019   LDLCALC 104 (H) 03/07/2019   Lab Results  Component Value Date   TSH 2.368 03/07/2019    Therapeutic Level Labs: No results found for:  LITHIUM No results found for: VALPROATE No components found for:  CBMZ  Current Medications: Current Outpatient Medications  Medication Sig Dispense Refill  . albuterol (VENTOLIN HFA) 108 (90 Base) MCG/ACT inhaler Inhale 2 puffs every 6 hours if needed for breathing 18 g 12  . [START ON 06/15/2020] Amphet-Dextroamphet 3-Bead ER (MYDAYIS) 50 MG CP24 Take 50 mg by mouth daily. 30 capsule 0  . Amphet-Dextroamphet 3-Bead ER (MYDAYIS) 50 MG CP24 Take 50 mg by mouth daily. 30 capsule 0  . ARIPiprazole (ABILIFY) 2 MG tablet Take 1 tablet (2 mg total) by mouth daily at 6 PM. 30 tablet 5  . B Complex CAPS Take by mouth daily.    [name] estradiol (ESTRACE) 1 MG tablet Take by mouth daily. Taking 2mg     . [START ON 06/13/2020] eszopiclone (LUNESTA) 2 MG TABS tablet Take 1 tablet (2 mg total) by mouth at bedtime as needed for sleep. Take immediately before bedtime 30 tablet 1  . FAMOTIDINE PO Take by mouth.    . loratadine (CLARITIN)  10 MG tablet Take 10 mg by mouth in the morning and at bedtime.    . melatonin 5 MG TABS Take 5 mg by mouth at bedtime.    . Multiple Vitamins-Minerals (EYE VITAMINS) CAPS Take by mouth daily.    [name] oxyCODONE (ROXICODONE) 5 MG immediate release tablet Take 1 tablet (5 mg total) by mouth every 4 (four) hours as needed for severe pain. 10 tablet 0  . polyethylene glycol (MIRALAX / GLYCOLAX) packet Take 17 g by mouth 3 (three) times daily as needed. 14 each 0  . sertraline (ZOLOFT) 100 MG tablet TAKE 2 TABLETS (200 MG TOTAL) BY MOUTH AT DINNER TIME. 60 tablet 2   No current facility-administered medications for this visit.     Psychiatric Specialty Exam: Review of Systems  Psychiatric/Behavioral: Positive for sleep disturbance. The patient is nervous/anxious.   All other systems reviewed and are negative.   Last menstrual period 10/08/2012.There is no height or weight on file to calculate BMI.  General Appearance: NA  Eye Contact:  NA  Speech:  Clear and Coherent and Normal  Rate  Volume:  Normal  Mood:  Anxious  Affect:  NA  Thought Process:  Goal Directed  Orientation:  Full (Time, Place, and Person)  Thought Content: Rumination   Suicidal Thoughts:  No  Homicidal Thoughts:  No  Memory:  Immediate;   Good Recent;   Good Remote;   Good  Judgement:  Good  Insight:  Fair  Psychomotor Activity:  NA  Concentration:  Concentration: Good  Recall:  Good  Fund of Knowledge: Good  Language: Good  Akathisia:  Negative  Handed:  Right  AIMS (if indicated): not done  Assets:  Communication Skills Desire for Improvement Financial Resources/Insurance Housing Social Support Talents/Skills  ADL's:  Intact  Cognition: WNL  Sleep:  Fair   Screenings: AIMS   Flowsheet Row Admission (Discharged) from OP Visit from 03/05/2019 in [facility] [facility]  AIMS Total Score 0    AUDIT   Flowsheet Row Admission (Discharged) from OP Visit from 03/05/2019 in [facility] [facility]  Alcohol Use Disorder Identification Test Final Score (AUDIT) 4    Flowsheet Row Admission (Discharged) from OP Visit from 03/05/2019 in [facility]  C-SSRS RISK CATEGORY High Risk       Assessment and Plan: 54yomarriedfemale with MDD/GAD and ADD.She was doing well on sertraline 200 mg and did not need lorazepam for anxiety often. Kathleen Key was also taking Adderall XR (last dose 40 mg) for ADD. Sheworks [employer].In December 2020 she was admitted [facility] with dx of MDD severe. Adderall and Zoloft were held while risperidone 2 mg was added. She improved to the extent that she was ready for dc home on 12.11.20. Shehasimproved: no hallucinations, sleep is better, appetite normal, depression and anxiety minimal.We restarted sertraline,Adderall,discontinued risperidone.We switched Adderall to [place] and her focusing is considerably better longer. Still she reports some residual depression despite  taking high dose of sertralineand fatigue despite being on a stimulant. Sleepwasinitiallybetter with lorazepambut now she has difficulty initiating and maintaining sleep. Clonazepam is only marginally more effective. I added aripiprazole 5 mg to augment sertraline but she could not tolerate GI side effects (nause, vomiting) so we decreased it to 2 mg which she tolerates well and mood improved further (takes it in the evening). She was supposed to try eszopiclone 2 mg for insomnia but has not started it yet.  Dx:MDD recurrent mild;ADD; GAD;  Plan:Continuesertraline200mg  dailyatdinner time together with 2 mg of aripiprazole andMydayis 50 mg in AM.Wewilltry eszopiclone 2 mg at HS for sleep.Next appointment in2 months with anew provider. The plan was discussed with patientwho had an opportunity to ask questions and these were all answered. I spend30minutes inphone consultation with the patient.   Stephanie Acre, MD 05/18/2020, 4:12 PM

## 2020-05-30 MED FILL — ESTRADIOL 2 MG TABS: 2 | 30 days supply | Qty: 30 | Fill #4

## 2020-05-31 MED FILL — MYDAYIS ER 50 MG CAPSULE: 50 | 30 days supply | Qty: 30 | Fill #0

## 2020-06-07 MED FILL — ARIPiprazole 2 MG TABS: 2 | 30 days supply | Qty: 30 | Fill #5

## 2020-06-07 MED FILL — SERTRALINE HCL 100 MG TAB: 100 | 30 days supply | Qty: 60 | Fill #1

## 2020-06-21 MED FILL — ESZOPICLONE 2 MG TAB: 2 | 30 days supply | Qty: 30 | Fill #1

## 2020-06-28 ENCOUNTER — Other Ambulatory Visit (HOSPITAL_COMMUNITY): Payer: Self-pay | Admitting: Obstetrics and Gynecology

## 2020-06-28 DIAGNOSIS — N959 Unspecified menopausal and perimenopausal disorder: Secondary | ICD-10-CM | POA: Diagnosis not present

## 2020-06-28 MED FILL — ESTRADIOL 2 MG TABS: 2 | 30 days supply | Qty: 60 | Fill #0

## 2020-06-29 ENCOUNTER — Other Ambulatory Visit (HOSPITAL_COMMUNITY): Payer: Self-pay | Admitting: Psychiatry

## 2020-06-29 ENCOUNTER — Telehealth (HOSPITAL_COMMUNITY): Payer: Self-pay

## 2020-06-29 MED ORDER — AMPHETAMINE-DEXTROAMPHET ER 25 MG PO CP24
50.0000 mg | ORAL_CAPSULE | ORAL | 0 refills | Status: DC
Start: 2020-06-29 — End: 2020-06-29

## 2020-06-29 NOTE — Telephone Encounter (Signed)
Received a call from [facility] regarding patient's Mydayis 50mg . They stated that they don't have it in stock and it's on backorder. The last update they got from [facility] was on 3/25 Backorder TBD. They stated that [facility] on [address] has 21 in stock and so they need it resent to [facility] for #21. Please review and advise. Thank you

## 2020-06-29 NOTE — Telephone Encounter (Signed)
I was in contact with pharmacist at [facility] about it and ordered Adderall XR 50 mg for the time being (until they ger [place]).

## 2020-06-30 ENCOUNTER — Telehealth (HOSPITAL_COMMUNITY): Payer: Self-pay | Admitting: *Deleted

## 2020-06-30 MED FILL — ADDERALL XR 25 MG CAPSULE: 25 | 30 days supply | Qty: 60 | Fill #0

## 2020-07-01 ENCOUNTER — Other Ambulatory Visit (HOSPITAL_COMMUNITY): Payer: Self-pay

## 2020-07-10 ENCOUNTER — Other Ambulatory Visit (HOSPITAL_COMMUNITY): Payer: Self-pay | Admitting: Psychiatry

## 2020-07-10 ENCOUNTER — Other Ambulatory Visit (HOSPITAL_COMMUNITY): Payer: Self-pay

## 2020-07-10 MED FILL — Sertraline HCl Tab 100 MG: ORAL | 30 days supply | Qty: 60 | Fill #0 | Status: AC

## 2020-07-11 NOTE — Telephone Encounter (Signed)
Opened in error

## 2020-07-12 ENCOUNTER — Other Ambulatory Visit (HOSPITAL_COMMUNITY): Payer: Self-pay | Admitting: Psychiatry

## 2020-07-12 ENCOUNTER — Telehealth (HOSPITAL_COMMUNITY): Payer: Self-pay | Admitting: *Deleted

## 2020-07-12 ENCOUNTER — Other Ambulatory Visit (HOSPITAL_COMMUNITY): Payer: Self-pay

## 2020-07-12 DIAGNOSIS — F330 Major depressive disorder, recurrent, mild: Secondary | ICD-10-CM

## 2020-07-12 DIAGNOSIS — F411 Generalized anxiety disorder: Secondary | ICD-10-CM

## 2020-07-12 MED ORDER — ARIPIPRAZOLE 2 MG PO TABS
2.0000 mg | ORAL_TABLET | Freq: Every day | ORAL | 1 refills | Status: DC
  Filled 2020-07-12: qty 30, 30d supply, fill #0

## 2020-07-12 NOTE — Telephone Encounter (Signed)
Pt of Dr. Montel Culver requesting refill of the Abilify 2 mg. Pt has not been assigned a new provider as of yet. Please review. Thank you.

## 2020-07-12 NOTE — Telephone Encounter (Signed)
I will send Abilify 2 mg 30-day supply with 1 refill-this will give patient enough time to establish care with another provider.

## 2020-07-13 ENCOUNTER — Other Ambulatory Visit (HOSPITAL_COMMUNITY): Payer: Self-pay

## 2020-07-19 ENCOUNTER — Other Ambulatory Visit (HOSPITAL_COMMUNITY): Payer: Self-pay

## 2020-07-19 DIAGNOSIS — M545 Low back pain, unspecified: Secondary | ICD-10-CM | POA: Diagnosis not present

## 2020-07-19 MED ORDER — MELOXICAM 7.5 MG PO TABS
7.5000 mg | ORAL_TABLET | Freq: Every day | ORAL | 0 refills | Status: AC
  Filled 2020-07-19: qty 7, 7d supply, fill #0

## 2020-07-19 MED ORDER — CYCLOBENZAPRINE HCL 5 MG PO TABS
5.0000 mg | ORAL_TABLET | Freq: Three times a day (TID) | ORAL | 0 refills | Status: AC | PRN
  Filled 2020-07-19: qty 21, 7d supply, fill #0

## 2020-07-20 ENCOUNTER — Other Ambulatory Visit (HOSPITAL_COMMUNITY): Payer: Self-pay

## 2020-07-20 MED FILL — Eszopiclone Tab 2 MG: ORAL | 30 days supply | Qty: 30 | Fill #0 | Status: AC

## 2020-07-24 ENCOUNTER — Telehealth (HOSPITAL_COMMUNITY): Payer: 59 | Admitting: Psychiatry

## 2020-07-24 ENCOUNTER — Other Ambulatory Visit: Payer: Self-pay

## 2020-07-24 DIAGNOSIS — Z23 Encounter for immunization: Secondary | ICD-10-CM | POA: Diagnosis not present

## 2020-07-24 NOTE — Progress Notes (Unsigned)
Patient no show, unable to reach via phone. To be rescheduled.  Karsten Fells, MD [phone]

## 2020-07-31 ENCOUNTER — Other Ambulatory Visit (HOSPITAL_COMMUNITY): Payer: Self-pay

## 2020-07-31 ENCOUNTER — Other Ambulatory Visit (HOSPITAL_COMMUNITY): Payer: Self-pay | Admitting: Psychiatry

## 2020-07-31 MED FILL — Estradiol Tab 2 MG: ORAL | 30 days supply | Qty: 60 | Fill #0 | Status: AC

## 2020-08-01 ENCOUNTER — Other Ambulatory Visit (HOSPITAL_COMMUNITY): Payer: Self-pay | Admitting: Psychiatry

## 2020-08-01 ENCOUNTER — Other Ambulatory Visit (HOSPITAL_COMMUNITY): Payer: Self-pay

## 2020-08-03 ENCOUNTER — Telehealth (HOSPITAL_COMMUNITY): Payer: Self-pay | Admitting: *Deleted

## 2020-08-03 ENCOUNTER — Other Ambulatory Visit: Payer: Self-pay

## 2020-08-03 ENCOUNTER — Telehealth (INDEPENDENT_AMBULATORY_CARE_PROVIDER_SITE_OTHER): Payer: 59 | Admitting: Psychiatry

## 2020-08-03 ENCOUNTER — Encounter (HOSPITAL_COMMUNITY): Payer: Self-pay | Admitting: Psychiatry

## 2020-08-03 DIAGNOSIS — F411 Generalized anxiety disorder: Secondary | ICD-10-CM | POA: Diagnosis not present

## 2020-08-03 DIAGNOSIS — F330 Major depressive disorder, recurrent, mild: Secondary | ICD-10-CM

## 2020-08-03 DIAGNOSIS — F988 Other specified behavioral and emotional disorders with onset usually occurring in childhood and adolescence: Secondary | ICD-10-CM

## 2020-08-03 MED ORDER — AMPHETAMINE-DEXTROAMPHET ER 20 MG PO CP24: 20.0000 mg | ORAL_CAPSULE | Freq: Every day | ORAL | 0 refills | Status: DC

## 2020-08-03 MED ORDER — SERTRALINE HCL 100 MG PO TABS
200.0000 mg | ORAL_TABLET | Freq: Every day | ORAL | 2 refills | Status: DC
  Filled 2020-08-04: qty 60, 30d supply, fill #0
  Filled 2020-09-11: qty 60, 30d supply, fill #1
  Filled 2020-10-11: qty 60, 30d supply, fill #2

## 2020-08-03 MED ORDER — ARIPIPRAZOLE 2 MG PO TABS: 2.0000 mg | ORAL_TABLET | Freq: Every day | ORAL | 1 refills | Status: DC

## 2020-08-03 NOTE — Telephone Encounter (Signed)
Pt called stating that script for Adderall be sent to [place]. Rx sent to [facility] in [place]. Says she needs it today.Thank you.

## 2020-08-03 NOTE — Progress Notes (Signed)
[facility] MD/PA/NP OP Progress Note  08/03/2020 2:07 PM Kathleen Key  MRN:  [id number] Interview was conducted by phone and I verified that I was speaking with the correct person using two identifiers. I discussed the limitations of evaluation and management by telemedicine and  the availability of in person appointments. Patient expressed understanding and agreed to proceed. Participants in the visit: patient (location - home); physician (location - home office).  Chief Complaint:  doing well  VFI:EPPIRJJ is a 55 yo married female with MDD/GAD and ADD. she was previously a patient of Dr. Bary Leriche, who is no longer  With [facility]. Patient reports she is currently doing well on this medication regimen. Sleeping better on Lunesta. Fair appetite. 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 We discussed how the Adderall is contributing to her sleep issues and discussed decreasing the dose of adderall to 40mg  from 60mg .Patient is agreeable. Denies any mood symptoms.Denies any use of alcohol or other substances. Denies any suicidal thoughts.   Per Dr.Pucilowska, per his last visit with patient, She was doing well on sertraline 200 mg and did not need lorazepam for anxiety often. Sosie was also taking Adderall XR (last dose 40 mg) for ADD. 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 In December 2020 she was admitted to [facility] with dx of MDD severe. Adderall and Zoloft were held while risperidone 2 mg was added. She improved to the extent that she was ready for dc home on 12.11.20. She has improved: no hallucinations, sleep is better, appetite normal, depression and anxiety minimal. We restarted sertraline, Adderall, discontinued risperidone. We switched Adderall to [place] and her focusing is considerably better longer. Still she reports some residual depression despite taking high dose of sertraline and fatigue despite being on a stimulant.  Sleep was initially better with lorazepam but now she has difficulty initiating and maintaining sleep. Clonazepam is only marginally more effective.  I added aripiprazole 5 mg to augment sertraline but she could not tolerate GI side effects (nause, vomiting) so we decreased it to 2 mg which she tolerates well and mood improved further (takes it in the evening). She was supposed to try eszopiclone 2 mg for insomnia but has not started it yet.       Visit Diagnosis:    ICD-10-CM   1. GAD (generalized anxiety disorder)  F41.1   2. ADD (attention deficit disorder) without hyperactivity  F98.8   3. Major depressive disorder, recurrent episode, mild (HCC)  F33.0     Past Psychiatric History: Please see intake H&P.  Past Medical History:  Past Medical History:  Diagnosis Date   ADD (attention deficit disorder)    Asthma, cough variant    pulmonology--- dr c. young   GAD (generalized anxiety disorder)    GERD (gastroesophageal reflux disease)    History of 2019 novel coronavirus disease (COVID-19) 01/05/2019   per pt positive results done at [facility] , Dr Lavone Orn,  per pt mild symptoms resolved in one week   History of urinary retention 08/2012   due to uterine retroverted s/p retroversion and hysterectomy   History of uterine fibroid    Irritable bowel syndrome with constipation    MDD (major depressive disorder)    Ovarian mass    Pelvic pain    Pulmonary sarcoidosis ([place]) 2014   followed by dr c. young-- dx 2014 lung nodules,  clinically inactive and stable   Shortness  of breath dyspnea     Past Surgical History:  Procedure Laterality Date   ABDOMINAL HYSTERECTOMY N/A 10/13/2012   Procedure: HYSTERECTOMY ABDOMINAL;  Surgeon: Jonnie Kind, MD;  Location: [facility];  Service: Gynecology;  Laterality: N/A;   BILATERAL SALPINGECTOMY Bilateral 10/13/2012   Procedure: BILATERAL SALPINGECTOMY;  Surgeon: Jonnie Kind, MD;  Location: [facility];  Service: Gynecology;  Laterality: Bilateral;    BREAST EXCISIONAL BIOPSY Left 05/2015   BREAST LUMPECTOMY WITH RADIOACTIVE SEED LOCALIZATION Left 07/28/2015   Procedure: LEFT BREAST LUMPECTOMY WITH RADIOACTIVE SEED LOCALIZATION;  Surgeon: Autumn Messing III, MD;  Location: [place];  Service: General;  Laterality: Left;   LAPAROSCOPIC BILATERAL SALPINGO OOPHERECTOMY Bilateral 12/29/2019   Procedure: LAPAROSCOPIC BILATERAL OOPHORECTOMY WITH ENDOCATCH;  Surgeon: Louretta Shorten, MD;  Location: [facility];  Service: Gynecology;  Laterality: Bilateral;   ULNAR NERVE TRANSPOSITION Left 05/2019   WISDOM TOOTH EXTRACTION     Dr. Georgina Snell office-[facility], [place], [place]    Family Psychiatric History: None.  Family History:  Family History  Problem Relation Age of Onset   Heart disease Mother        leaky valve   Asthma Mother    Hypertension Father        pacemaker   Stroke Father    Heart disease Father        pacemaker   Fibromyalgia Sister    Neuropathy Sister     Social History:  Social History   Socioeconomic History   Marital status: Married    Spouse name: Not on file   Number of children: Not on file   Years of education: Not on file   Highest education level: Not on file  Occupational History   Not on file  Tobacco Use   Smoking status: Never Smoker   Smokeless tobacco: Never Used  Vaping Use   Vaping Use: Never used  Substance and Sexual Activity   Alcohol use: Yes    Comment: occassional   Drug use: Never   Sexual activity: Yes    Birth control/protection: Surgical  Other Topics Concern   Not on file  Social History Narrative   Not on file   Social Determinants of Health   Financial Resource Strain: Not on file  Food Insecurity: Not on file  Transportation Needs: Not on file  Physical Activity: Not on file  Stress: Not on file  Social Connections: Not on file    Allergies:  Allergies  Allergen Reactions   Dilaudid [Hydromorphone Hcl] Itching   Excedrin  Back & [Acetaminophen-Aspirin Buffered] Other (See Comments)    SUGAR-SPIKE, NERVOUSNESS   Oxycodone Itching    percocet   Tramadol Itching    Metabolic Disorder Labs: Lab Results  Component Value Date   HGBA1C 5.6 03/07/2019   MPG 114.02 03/07/2019   No results found for: PROLACTIN Lab Results  Component Value Date   CHOL 194 03/07/2019   TRIG 78 03/07/2019   HDL 74 03/07/2019   CHOLHDL 2.6 03/07/2019   VLDL 16 03/07/2019   LDLCALC 104 (H) 03/07/2019   Lab Results  Component Value Date   TSH 2.368 03/07/2019    Therapeutic Level Labs: No results found for: LITHIUM No results found for: VALPROATE No components found for:  CBMZ  Current Medications: Current Outpatient Medications  Medication Sig Dispense Refill   albuterol (VENTOLIN HFA) 108 (90 Base) MCG/ACT inhaler Inhale 2 puffs every 6 hours if needed for breathing 18 g 12  Amphet-Dextroamphet 3-Bead ER (MYDAYIS) 50 MG CP24 Take 50 mg by mouth daily. 30 capsule 0   Amphet-Dextroamphet 3-Bead ER 50 MG CP24 TAKE 1 CAPSULE BY MOUTH ONCE A DAY 30 capsule 0   Amphet-Dextroamphet 3-Bead ER 50 MG CP24 TAKE 1 CAPSULE BY MOUTH ONCE A DAY 30 capsule 0   Amphet-Dextroamphet 3-Bead ER 50 MG CP24 TAKE 1 CAPSULE BY MOUTH ONCE DAILY 30 capsule 0   amphetamine-dextroamphetamine (ADDERALL XR) 25 MG 24 hr capsule TAKE 2 CAPSULES BY MOUTH EVERY MORNING. 60 capsule 0   ARIPiprazole (ABILIFY) 2 MG tablet Take 1 tablet (2 mg total) by mouth daily at 6 PM. 30 tablet 1   B Complex CAPS Take by mouth daily.     estradiol (ESTRACE) 1 MG tablet Take by mouth daily. Taking 2mg      estradiol (ESTRACE) 2 MG tablet TAKE 2 TABLETS BY MOUTH EVERY DAY 60 tablet 12   estradiol (ESTRACE) 2 MG tablet TAKE 1 TABLET BY MOUTH EVERY DAY 30 tablet 12   eszopiclone (LUNESTA) 2 MG TABS tablet TAKE 1 TABLET (2 MG TOTAL) BY MOUTH AT BEDTIME AS NEEDED FOR SLEEP TAKE IMMEDIATELY BEFORE BEDTIME 30 tablet 1   FAMOTIDINE PO Take by mouth.     loratadine (CLARITIN)  10 MG tablet Take 10 mg by mouth in the morning and at bedtime.     melatonin 5 MG TABS Take 5 mg by mouth at bedtime.     Multiple Vitamins-Minerals (EYE VITAMINS) CAPS Take by mouth daily.     oxyCODONE (ROXICODONE) 5 MG immediate release tablet Take 1 tablet (5 mg total) by mouth every 4 (four) hours as needed for severe pain. 10 tablet 0   polyethylene glycol (MIRALAX / GLYCOLAX) packet Take 17 g by mouth 3 (three) times daily as needed. 14 each 0   sertraline (ZOLOFT) 100 MG tablet TAKE 2 TABLETS (200 MG TOTAL) BY MOUTH AT DINNER TIME. 60 tablet 2   No current facility-administered medications for this visit.     Psychiatric Specialty Exam: Review of Systems  Psychiatric/Behavioral: Positive for sleep disturbance. The patient is nervous/anxious.   All other systems reviewed and are negative.   Last menstrual period 10/08/2012.There is no height or weight on file to calculate BMI.  General Appearance: NA  Eye Contact:  NA  Speech:  Clear and Coherent and Normal Rate  Volume:  Normal  Mood:  good  Affect:  NA  Thought Process:  Goal Directed  Orientation:  Full (Time, Place, and Person)  Thought Content: Rumination   Suicidal Thoughts:  No  Homicidal Thoughts:  No  Memory:  Immediate;   Good Recent;   Good Remote;   Good  Judgement:  Good  Insight:  Fair  Psychomotor Activity:  NA  Concentration:  Concentration: Good  Recall:  Good  Fund of Knowledge: Good  Language: Good  Akathisia:  Negative  Handed:  Right  AIMS (if indicated): not done  Assets:  Communication Skills Desire for Improvement Financial Resources/Insurance Housing Social Support Talents/Skills  ADL's:  Intact  Cognition: WNL  Sleep:  ok   Screenings: AIMS    Flowsheet Row Admission (Discharged) from OP Visit from 03/05/2019 in [facility]  AIMS Total Score 0      AUDIT    Flowsheet Row Admission (Discharged) from OP Visit from 03/05/2019 in [facility]  Alcohol Use Disorder Identification Test Final Score (AUDIT) 4      Flowsheet  Row Admission (Discharged) from OP Visit from 03/05/2019 in [facility]  C-SSRS RISK CATEGORY High Risk        Assessment and Plan: 55 yo married female with MDD/GAD and ADD. She was doing well on sertraline 200 mg and did not need lorazepam for anxiety often. Eupha was also taking Adderall XR (last dose 40 mg) for ADD. 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 In December 2020 she was admitted to [facility] with dx of MDD severe. Adderall and Zoloft were held while risperidone 2 mg was added. She improved to the extent that she was ready for dc home on 12.11.20. She has improved: no hallucinations, sleep is better, appetite normal, depression and anxiety minimal. We restarted sertraline, Adderall, discontinued risperidone. We switched Adderall to [place] and her focusing is considerably better longer. Still she reports some residual depression despite taking high dose of sertraline and fatigue despite being on a stimulant. Sleep was initially better with lorazepam but now she has difficulty initiating and maintaining sleep. Clonazepam is only marginally more effective.  I added aripiprazole 5 mg to augment sertraline but she could not tolerate GI side effects (nause, vomiting) so we decreased it to 2 mg which she tolerates well and mood improved further (takes it in the evening). She was supposed to try eszopiclone 2 mg for insomnia but has not started it yet.    Dx: MDD recurrent mild; ADD; GAD;    Plan: Continue sertraline 200 mg daily at dinner time together with 2 mg of aripiprazole.  Decrease Adderall to 40mg  daily to take in the morning (to help with insomnia) continue eszopiclone 2 mg at HS for sleep. Next appointment in 3 months. The plan was discussed with patient who had an opportunity to ask questions and these were all answered. I spend 15 minutes in phone  consultation with the patient. I spent 20 minutes in chart review.Elvin So, MD 08/03/2020, 2:07 PM

## 2020-08-04 ENCOUNTER — Other Ambulatory Visit (HOSPITAL_COMMUNITY): Payer: Self-pay

## 2020-08-04 ENCOUNTER — Telehealth (HOSPITAL_COMMUNITY): Payer: Self-pay | Admitting: *Deleted

## 2020-08-04 MED ORDER — ARIPIPRAZOLE 2 MG PO TABS
2.0000 mg | ORAL_TABLET | Freq: Every day | ORAL | 0 refills | Status: DC
  Filled 2020-08-04 (×2): qty 30, 30d supply, fill #0
  Filled 2020-09-11: qty 30, 30d supply, fill #1

## 2020-08-04 NOTE — Telephone Encounter (Signed)
Pt called for second time requesting that Adderall script be sent to [facility]. Pt stated again that she needs medication tranferred today. Writer has left two VM's for pt to update that she has sent message to Dr. Einar Grad and is awaiting response.

## 2020-08-07 ENCOUNTER — Other Ambulatory Visit (HOSPITAL_COMMUNITY): Payer: Self-pay

## 2020-08-07 ENCOUNTER — Other Ambulatory Visit (HOSPITAL_COMMUNITY): Payer: Self-pay | Admitting: Psychiatry

## 2020-08-07 DIAGNOSIS — R21 Rash and other nonspecific skin eruption: Secondary | ICD-10-CM | POA: Diagnosis not present

## 2020-08-07 MED ORDER — ADDERALL XR 20 MG PO CP24
40.0000 mg | ORAL_CAPSULE | Freq: Every day | ORAL | 0 refills | Status: DC
  Filled 2020-08-07 – 2020-08-22 (×2): qty 60, 30d supply, fill #0

## 2020-08-07 MED ORDER — AMPHETAMINE-DEXTROAMPHET ER 20 MG PO CP24
20.0000 mg | ORAL_CAPSULE | Freq: Every day | ORAL | 0 refills | Status: DC
  Filled 2020-10-30 (×2): qty 60, 60d supply, fill #0

## 2020-08-07 NOTE — Progress Notes (Signed)
Patient called saying prescriptions were sent to wrong pharmacy in [place].  Prescriptions sent to [facility] per patient's request.  Karsten Fells, MD

## 2020-08-08 ENCOUNTER — Other Ambulatory Visit (HOSPITAL_COMMUNITY): Payer: Self-pay

## 2020-08-15 IMAGING — CT CT ABD-PELV W/ CM
2 of 5 series · 15 of 46 positions shown, 17 images · IV contrast (Omni 300)
Comparison: Abdomen pelvis CT 09/29/2012

CLINICAL DATA: Acute generalized abdominal pain. Nausea and
vomiting.

EXAM:
CT ABDOMEN AND PELVIS WITH CONTRAST
TECHNIQUE: Multidetector CT imaging of the abdomen and pelvis was performed
using the standard protocol following bolus administration of
intravenous contrast.
CONTRAST:  80mL OMNIPAQUE IOHEXOL 300 MG/ML  SOLN

[Series 3: a/p w/ 5mm · axial · 0.71mm/px · z∈[+72,+457]mm · 12 of 87 slices shown, 14 images]
[im 5/87  soft-tissue]
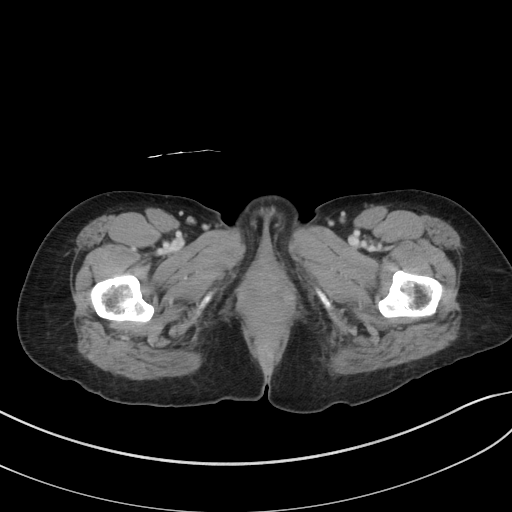
[im 5/87  bone]
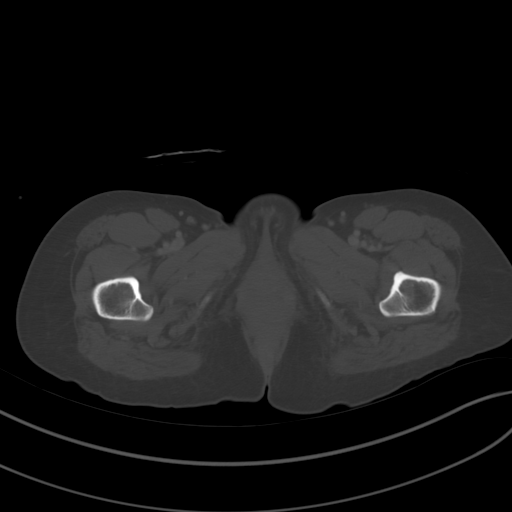
[im 13/87  soft-tissue]
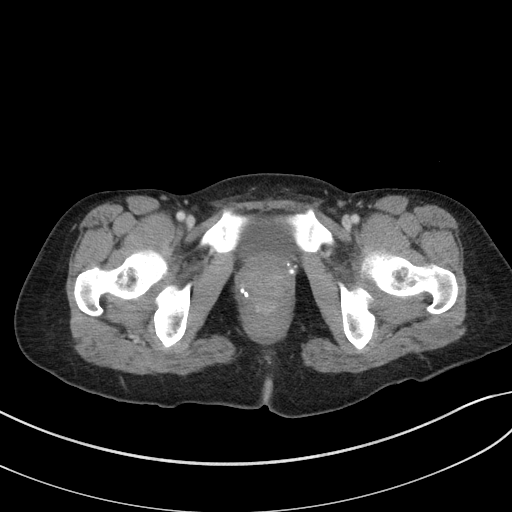
[im 21/87  soft-tissue]
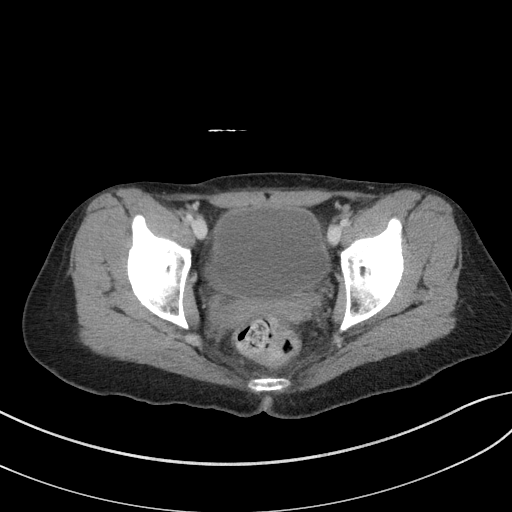
[im 25/87  soft-tissue]
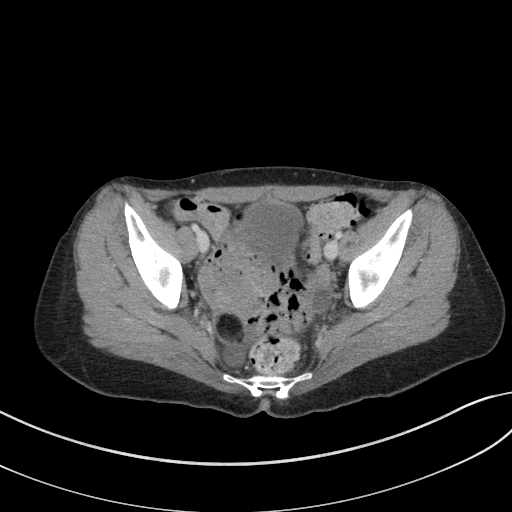
[im 33/87  soft-tissue]
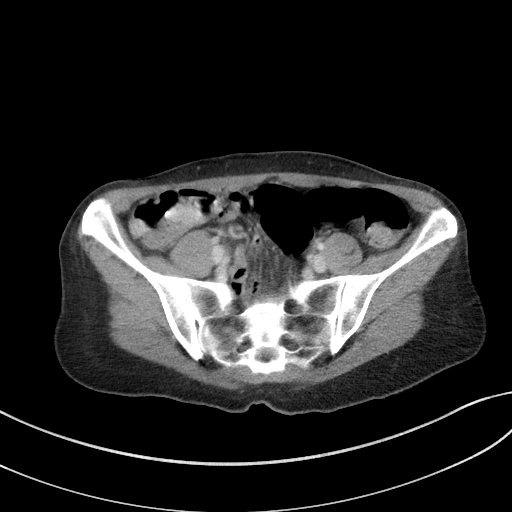
[im 41/87  soft-tissue]
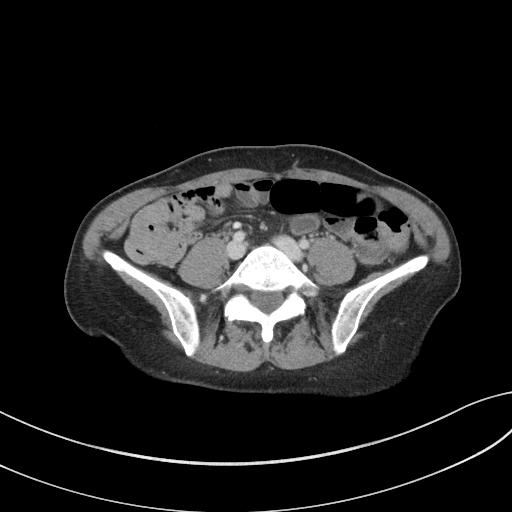
[im 46/87  soft-tissue]
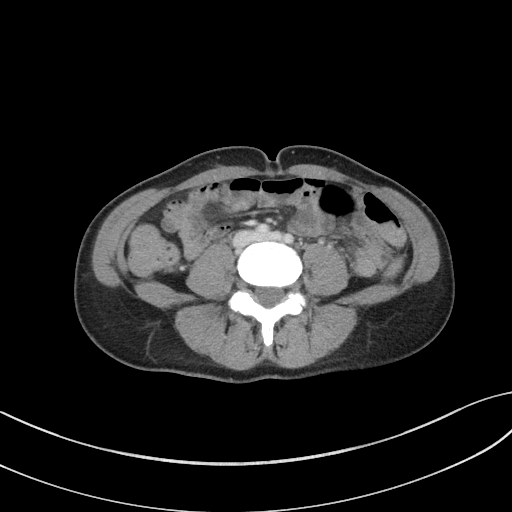
[im 54/87  soft-tissue]
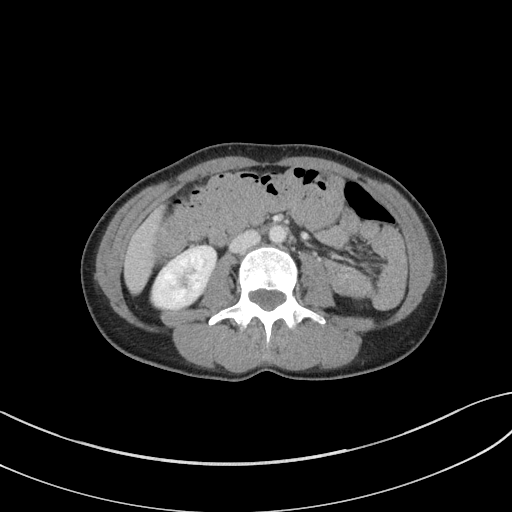
[im 62/87  soft-tissue]
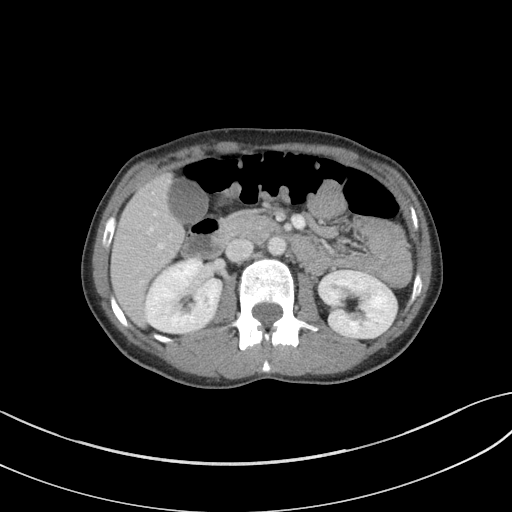
[im 62/87  bone]
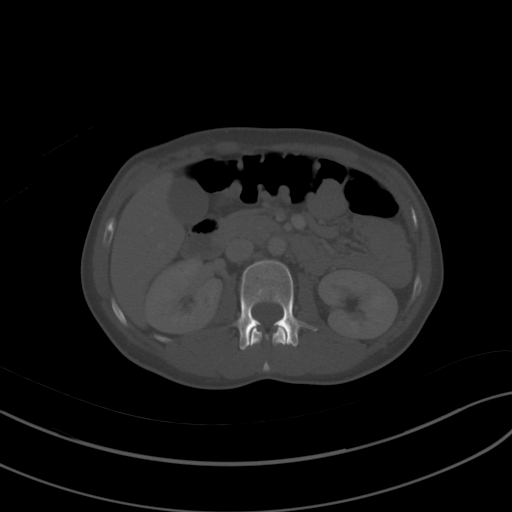
[im 66/87  soft-tissue]
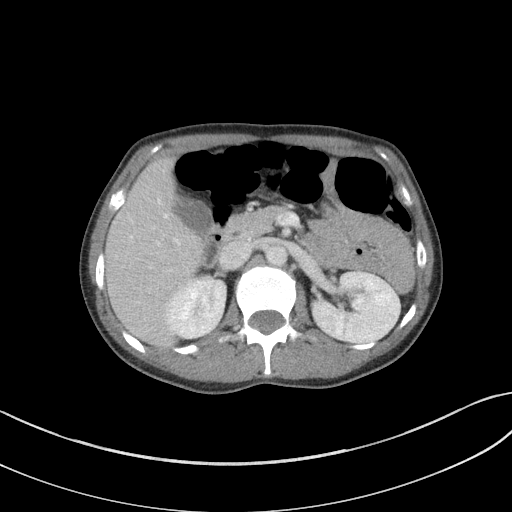
[im 74/87  soft-tissue]
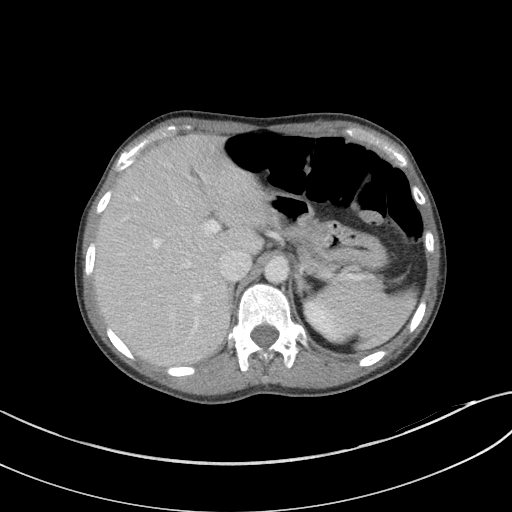
[im 82/87  soft-tissue]
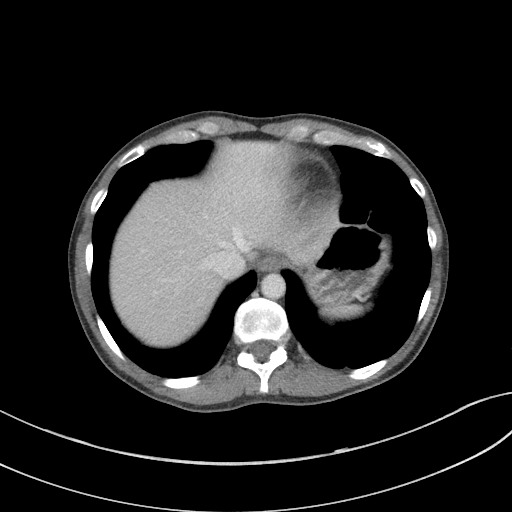

[Series 6: a/p w/ cor · coronal · 0.78mm/px · 3 of 129 slices shown]
[im 43/129  soft-tissue]
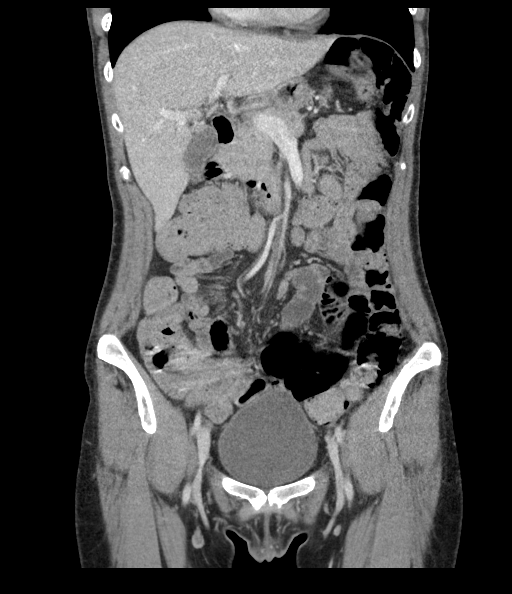
[im 57/129  soft-tissue]
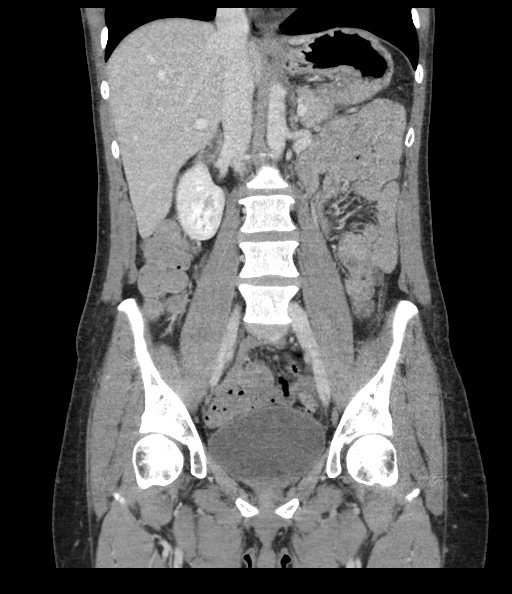
[im 72/129  soft-tissue]
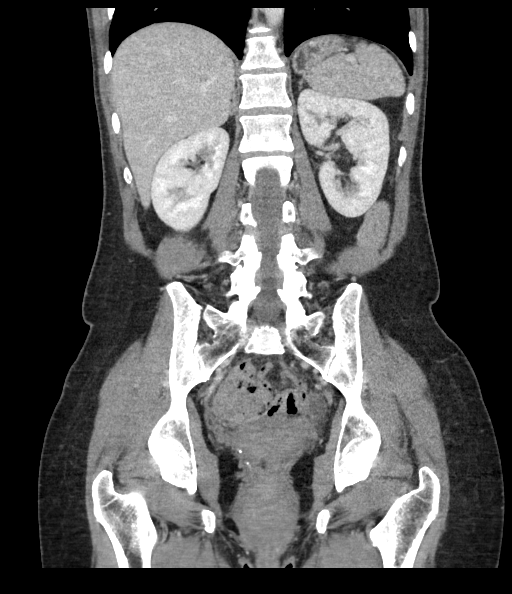

[15 of 46 positions shown; findings below may reference images not displayed]

FINDINGS: Lower chest: Small subpleural nodule in the left lower lobe
unchanged from prior chest CT. No acute airspace disease. No pleural
fluid. Heart is normal in size. Small pericardial effusion measures
up to 13 mm in depth anteriorly.

Hepatobiliary: Few tiny subcentimeter scattered hepatic
hypodensities are too small to accurately characterize but likely
small cyst or hemangioma. No suspicious hepatic lesion. No
gallstones, gallbladder wall thickening, or biliary dilatation.

Pancreas: No ductal dilatation or inflammation.

Spleen: Normal in size without focal abnormality.

Adrenals/Urinary Tract: Normal adrenal glands. No hydronephrosis. No
perinephric edema. Slight bilateral heterogeneous renal enhancement
which is nonspecific. There are small bilateral cortical
hypodensities that are too small to accurately characterize. Ureters
are decompressed. Symmetric renal excretion on delayed phase
imaging. Urinary bladder is physiologically distended and
unremarkable.

Stomach/Bowel: Bowel evaluation is limited in the absence of enteric
contrast and paucity of intra abdominal CT. Nondistended stomach.
Small bowel is decompressed. No bowel obstruction. No evidence of
bowel inflammation. Cecum is located in the central lower abdomen.
Appendix tentatively identified and normal, series 6, image 52.
Moderate volume of stool throughout the colon. Tortuosity of the
transverse and descending colon. No colonic inflammation.

Vascular/Lymphatic: Abdominal aorta is normal in caliber. Portal
vein is patent. Mesenteric vessels are patent. No adenopathy.

Reproductive: Post hysterectomy. 2.4 cm rounded fat density lesion
in the right adnexa with adjacent cyst, likely ovarian dermoid. Left
ovary tentatively visualized and normal.

Other: Minimal free fluid in the pelvis. No upper abdominal ascites.
No free air or intra-abdominal abscess.

Musculoskeletal: There are no acute or suspicious osseous
abnormalities.
IMPRESSION: 1. Slight heterogeneous renal enhancement, recommend correlation
with urinalysis to exclude urinary tract infection. No other acute
findings in the abdomen/pelvis.
2. Small pericardial effusion measures up to 13 mm in depth
anteriorly.
3. Fat density lesion in the right adnexa measuring 2.4 cm is likely
an ovarian dermoid. Recommend nonemergent pelvic ultrasound for
characterization.

## 2020-08-22 ENCOUNTER — Other Ambulatory Visit (HOSPITAL_COMMUNITY): Payer: Self-pay

## 2020-08-22 ENCOUNTER — Telehealth (HOSPITAL_COMMUNITY): Payer: Self-pay | Admitting: *Deleted

## 2020-08-22 NOTE — Telephone Encounter (Signed)
Thanks for information

## 2020-08-22 NOTE — Telephone Encounter (Signed)
Writer received call from pt today and spoke with pt yesterday regarding refilling Adderall XR 20 mg (total dose 40 mg qd) .Probation officer spoke with PhT at [facility] who stated that it's too early to fill as pt has already filled it at [facility] on 08/03/20. Most recent script written on 08/07/20 by Dr. Einar Grad. Writer left VM on pt mobile phone stating such and encouraged pt to call pharmacy if this is not correct.

## 2020-08-23 ENCOUNTER — Other Ambulatory Visit (HOSPITAL_COMMUNITY): Payer: Self-pay

## 2020-08-25 ENCOUNTER — Other Ambulatory Visit (HOSPITAL_COMMUNITY): Payer: Self-pay

## 2020-08-25 MED FILL — Eszopiclone Tab 2 MG: ORAL | 30 days supply | Qty: 30 | Fill #1 | Status: AC

## 2020-08-30 ENCOUNTER — Telehealth (HOSPITAL_COMMUNITY): Payer: Self-pay | Admitting: *Deleted

## 2020-08-30 ENCOUNTER — Other Ambulatory Visit (HOSPITAL_COMMUNITY): Payer: Self-pay

## 2020-08-30 MED ORDER — PROGESTERONE MICRONIZED 100 MG PO CAPS
100.0000 mg | ORAL_CAPSULE | Freq: Every day | ORAL | 11 refills | Status: DC
  Filled 2020-08-30: qty 30, 30d supply, fill #0
  Filled 2020-09-29: qty 30, 30d supply, fill #1
  Filled 2020-11-01: qty 30, 30d supply, fill #2
  Filled 2020-12-05: qty 30, 30d supply, fill #3
  Filled 2021-01-08: qty 30, 30d supply, fill #4

## 2020-08-30 NOTE — Telephone Encounter (Signed)
Placed call to patient to inform of cancelled appointment.  Discussed need for patient to secure another provider and gave information regarding the resource letter that has been mailed.  Patient has some  Medications that may need refills. Patient was asked to call if she needs refills.  Message left on voicemail.

## 2020-08-31 ENCOUNTER — Other Ambulatory Visit (HOSPITAL_COMMUNITY): Payer: Self-pay

## 2020-08-31 MED FILL — Estradiol Tab 2 MG: ORAL | 30 days supply | Qty: 60 | Fill #1 | Status: AC

## 2020-09-11 ENCOUNTER — Other Ambulatory Visit (HOSPITAL_COMMUNITY): Payer: Self-pay

## 2020-09-19 DIAGNOSIS — K219 Gastro-esophageal reflux disease without esophagitis: Secondary | ICD-10-CM | POA: Diagnosis not present

## 2020-09-19 DIAGNOSIS — Z0000 Encounter for general adult medical examination without abnormal findings: Secondary | ICD-10-CM | POA: Diagnosis not present

## 2020-09-19 DIAGNOSIS — J4520 Mild intermittent asthma, uncomplicated: Secondary | ICD-10-CM | POA: Diagnosis not present

## 2020-09-19 DIAGNOSIS — F325 Major depressive disorder, single episode, in full remission: Secondary | ICD-10-CM | POA: Diagnosis not present

## 2020-09-21 ENCOUNTER — Other Ambulatory Visit (HOSPITAL_COMMUNITY): Payer: Self-pay

## 2020-09-21 MED ORDER — ESZOPICLONE 2 MG PO TABS
2.0000 mg | ORAL_TABLET | Freq: Every day | ORAL | 1 refills | Status: DC
  Filled 2020-09-21: qty 30, 30d supply, fill #0
  Filled 2020-11-28: qty 30, 30d supply, fill #1

## 2020-09-21 MED ORDER — AMPHETAMINE-DEXTROAMPHET ER 20 MG PO CP24
40.0000 mg | ORAL_CAPSULE | Freq: Every morning | ORAL | 0 refills | Status: DC
  Filled 2020-09-21: qty 60, 30d supply, fill #0

## 2020-09-22 ENCOUNTER — Other Ambulatory Visit (HOSPITAL_COMMUNITY): Payer: Self-pay

## 2020-09-28 ENCOUNTER — Other Ambulatory Visit (HOSPITAL_COMMUNITY): Payer: Self-pay

## 2020-09-29 ENCOUNTER — Other Ambulatory Visit (HOSPITAL_COMMUNITY): Payer: Self-pay

## 2020-10-11 MED FILL — Estradiol Tab 2 MG: ORAL | 30 days supply | Qty: 60 | Fill #2 | Status: AC

## 2020-10-12 ENCOUNTER — Other Ambulatory Visit (HOSPITAL_COMMUNITY): Payer: Self-pay | Admitting: Psychiatry

## 2020-10-12 ENCOUNTER — Other Ambulatory Visit (HOSPITAL_COMMUNITY): Payer: Self-pay

## 2020-10-13 ENCOUNTER — Other Ambulatory Visit (HOSPITAL_COMMUNITY): Payer: Self-pay

## 2020-10-16 ENCOUNTER — Other Ambulatory Visit (HOSPITAL_COMMUNITY): Payer: Self-pay

## 2020-10-18 ENCOUNTER — Other Ambulatory Visit (HOSPITAL_COMMUNITY): Payer: Self-pay

## 2020-10-24 ENCOUNTER — Other Ambulatory Visit (HOSPITAL_COMMUNITY): Payer: Self-pay

## 2020-10-30 ENCOUNTER — Other Ambulatory Visit (HOSPITAL_COMMUNITY): Payer: Self-pay

## 2020-10-30 MED ORDER — ARIPIPRAZOLE 2 MG PO TABS
2.0000 mg | ORAL_TABLET | Freq: Every day | ORAL | 0 refills | Status: DC
  Filled 2020-10-30: qty 30, 30d supply, fill #0

## 2020-10-30 MED ORDER — AMPHETAMINE-DEXTROAMPHET ER 20 MG PO CP24
40.0000 mg | ORAL_CAPSULE | Freq: Every day | ORAL | 0 refills | Status: DC
  Filled 2020-10-30 – 2020-10-31 (×2): qty 60, 30d supply, fill #0

## 2020-10-30 MED ORDER — SERTRALINE HCL 100 MG PO TABS
200.0000 mg | ORAL_TABLET | Freq: Every day | ORAL | 0 refills | Status: DC
  Filled 2020-10-30 – 2020-11-13 (×2): qty 60, 30d supply, fill #0

## 2020-10-31 ENCOUNTER — Other Ambulatory Visit (HOSPITAL_COMMUNITY): Payer: Self-pay

## 2020-11-01 ENCOUNTER — Other Ambulatory Visit (HOSPITAL_COMMUNITY): Payer: Self-pay

## 2020-11-06 ENCOUNTER — Telehealth (HOSPITAL_COMMUNITY): Payer: 59 | Admitting: Psychiatry

## 2020-11-13 ENCOUNTER — Other Ambulatory Visit (HOSPITAL_COMMUNITY): Payer: Self-pay

## 2020-11-17 DIAGNOSIS — F329 Major depressive disorder, single episode, unspecified: Secondary | ICD-10-CM | POA: Diagnosis not present

## 2020-11-28 ENCOUNTER — Other Ambulatory Visit (HOSPITAL_COMMUNITY): Payer: Self-pay

## 2020-11-28 MED ORDER — ARIPIPRAZOLE 2 MG PO TABS
2.0000 mg | ORAL_TABLET | Freq: Every day | ORAL | 1 refills | Status: DC
  Filled 2020-11-28: qty 30, 30d supply, fill #0
  Filled 2020-12-28: qty 30, 30d supply, fill #1

## 2020-11-28 MED FILL — Estradiol Tab 2 MG: ORAL | 30 days supply | Qty: 60 | Fill #3 | Status: AC

## 2020-12-05 ENCOUNTER — Other Ambulatory Visit (HOSPITAL_COMMUNITY): Payer: Self-pay

## 2020-12-12 ENCOUNTER — Other Ambulatory Visit (HOSPITAL_COMMUNITY): Payer: Self-pay

## 2020-12-12 MED ORDER — SERTRALINE HCL 100 MG PO TABS
200.0000 mg | ORAL_TABLET | Freq: Every day | ORAL | 0 refills | Status: DC
  Filled 2020-12-12: qty 180, 90d supply, fill #0

## 2020-12-12 MED ORDER — AMPHETAMINE-DEXTROAMPHET ER 20 MG PO CP24
40.0000 mg | ORAL_CAPSULE | Freq: Every morning | ORAL | 0 refills | Status: DC
  Filled 2020-12-12: qty 60, 30d supply, fill #0

## 2020-12-15 DIAGNOSIS — F329 Major depressive disorder, single episode, unspecified: Secondary | ICD-10-CM | POA: Diagnosis not present

## 2020-12-28 ENCOUNTER — Other Ambulatory Visit (HOSPITAL_COMMUNITY): Payer: Self-pay

## 2021-01-08 ENCOUNTER — Other Ambulatory Visit (HOSPITAL_COMMUNITY): Payer: Self-pay

## 2021-01-09 ENCOUNTER — Telehealth: Payer: 59 | Admitting: Physician Assistant

## 2021-01-09 ENCOUNTER — Other Ambulatory Visit (HOSPITAL_COMMUNITY): Payer: Self-pay

## 2021-01-09 DIAGNOSIS — M222X2 Patellofemoral disorders, left knee: Secondary | ICD-10-CM

## 2021-01-09 MED ORDER — MELOXICAM 7.5 MG PO TABS
7.5000 mg | ORAL_TABLET | Freq: Every day | ORAL | 0 refills | Status: DC
  Filled 2021-01-09: qty 30, 30d supply, fill #0

## 2021-01-09 MED FILL — Estradiol Tab 2 MG: ORAL | 30 days supply | Qty: 60 | Fill #4 | Status: AC

## 2021-01-09 NOTE — Progress Notes (Signed)
Virtual Visit Consent   Kathleen Key, you are scheduled for a virtual visit with a [facility] provider today.     Just as with appointments in the office, your consent must be obtained to participate.  Your consent will be active for this visit and any virtual visit you may have with one of our providers in the next 365 days.     If you have a MyChart account, a copy of this consent can be sent to you electronically.  All virtual visits are billed to your insurance company just like a traditional visit in the office.    As this is a virtual visit, video technology does not allow for your provider to perform a traditional examination.  This may limit your provider's ability to fully assess your condition.  If your provider identifies any concerns that need to be evaluated in person or the need to arrange testing (such as labs, EKG, etc.), we will make arrangements to do so.     Although advances in technology are sophisticated, we cannot ensure that it will always work on either your end or our end.  If the connection with a video visit is poor, the visit may have to be switched to a telephone visit.  With either a video or telephone visit, we are not always able to ensure that we have a secure connection.     I need to obtain your verbal consent now.   Are you willing to proceed with your visit today?    ANAYSIA GERMER has provided verbal consent on 01/09/2021 for a virtual visit (video or telephone).   Leeanne Rio, [place]   Date: 01/09/2021 3:52 PM   Virtual Visit via Video Note   I, Leeanne Rio, connected with  JERMAINE THOLL  ([id number], [birth year]) on 01/09/21 at  3:15 PM EDT by a video-enabled telemedicine application and verified that I am speaking with the correct person using two identifiers.  Location: Patient: Virtual Visit Location Patient: Home Provider: Virtual Visit Location Provider: Home Office   I discussed the limitations of evaluation and management by  telemedicine and the availability of in person appointments. The patient expressed understanding and agreed to proceed.    History of Present Illness: Kathleen Key is a 55 y.o. who identifies as a female who was assigned female at birth, and is being seen today for a couple of weeks of pain in the L knee. Denies any known trauma or injury to the knee recently or remotely. Pain is dull and sharp and anterior, sometimes behind the kneecap and other times, more anterior. No radiation of pain. Worse with ambulation. Does note she has had to walk a lot more, especially stairs and will sit with her left leg underneath her a lot of the time. Denies any numbness or tingling. Denies skin changes but notes mild swelling. Denies symptoms of R leg.  HPI: HPI  Problems:  Patient Active Problem List   Diagnosis Date Noted   COVID-19 virus infection 01/17/2019   Major depressive disorder, recurrent episode, mild ([place]) 05/26/2018   GAD (generalized anxiety disorder) 04/29/2018   ADD (attention deficit disorder) without hyperactivity 04/29/2018   Rash and nonspecific skin eruption 04/25/2017   Asthma, cough variant 02/23/2014   Routine gynecological examination 11/29/2013   Sarcoid ([place]) 12/09/2012   Post op infection 10/15/2012    Allergies:  Allergies  Allergen Reactions   Dilaudid [Hydromorphone Hcl] Itching   Excedrin Back & [Acetaminophen-Aspirin Buffered]  Other (See Comments)    SUGAR-SPIKE, NERVOUSNESS   Oxycodone Itching    percocet   Tramadol Itching   Medications:  Current Outpatient Medications:    meloxicam (MOBIC) 7.5 MG tablet, Take 1 tablet (7.5 mg total) by mouth daily., Disp: 30 tablet, Rfl: 0   albuterol (VENTOLIN HFA) 108 (90 Base) MCG/ACT inhaler, Inhale 2 puffs every 6 hours if needed for breathing, Disp: 18 g, Rfl: 12   ADDERALL XR 20 MG 24 hr capsule, Take 2 capsules (40 mg total) by mouth daily., Disp: 60 capsule, Rfl: 0   amphetamine-dextroamphetamine (ADDERALL XR) 20 MG 24  hr capsule, Take 2 capsules (40 mg total) by mouth in the morning., Disp: 60 capsule, Rfl: 0   amphetamine-dextroamphetamine (ADDERALL XR) 20 MG 24 hr capsule, Take 2 capsules (40 mg total) by mouth every morning., Disp: 60 capsule, Rfl: 0   ARIPiprazole (ABILIFY) 2 MG tablet, Take 1 tablet (2 mg total) by mouth daily at 6 PM., Disp: 30 tablet, Rfl: 1   B Complex CAPS, Take by mouth daily., Disp: , Rfl:    estradiol (ESTRACE) 2 MG tablet, TAKE 2 TABLETS BY MOUTH EVERY DAY, Disp: 60 tablet, Rfl: 12   estradiol (ESTRACE) 2 MG tablet, TAKE 1 TABLET BY MOUTH EVERY DAY, Disp: 30 tablet, Rfl: 12   eszopiclone (LUNESTA) 2 MG TABS tablet, Take 1 tablet (2 mg total) by mouth immediately before bedtime as needed, Disp: 30 tablet, Rfl: 1   FAMOTIDINE PO, Take by mouth., Disp: , Rfl:    loratadine (CLARITIN) 10 MG tablet, Take 10 mg by mouth in the morning and at bedtime., Disp: , Rfl:    Multiple Vitamins-Minerals (EYE VITAMINS) CAPS, Take by mouth daily., Disp: , Rfl:    polyethylene glycol (MIRALAX / GLYCOLAX) packet, Take 17 g by mouth 3 (three) times daily as needed., Disp: 14 each, Rfl: 0   progesterone (PROMETRIUM) 100 MG capsule, Take 1 capsule (100 mg total) by mouth daily., Disp: 30 capsule, Rfl: 11   sertraline (ZOLOFT) 100 MG tablet, Take 2 tablets (200 mg total) by mouth daily., Disp: 180 tablet, Rfl: 0  Observations/Objective: Patient is well-developed, well-nourished in no acute distress.  Resting comfortably at home.  Head is normocephalic, atraumatic.  No labored breathing. Speech is clear and coherent with logical content.  Patient is alert and oriented at baseline.   Assessment and Plan: 1. Patellofemoral pain syndrome of left knee - meloxicam (MOBIC) 7.5 MG tablet; Take 1 tablet (7.5 mg total) by mouth daily.  Dispense: 30 tablet; Refill: 0 She is to wrap the knee with a knee sleeve or ACE wrap daily. Elevate leg while resting. Want her to rest and elevate leg for 5-10 minutes every  1-2 hours of work for next 2 weeks. Work note written. Can ice the knee for 10 minutes a couple of times per day over the next few days to help with mild swelling. Rx Meloxicam to take once daily with food. Will start with lower dose (7.5) as she is on multiple anti-depressants and do not want to increase GI risks. Tylenol for breakthrough pain. Stretches reviewed. Resources given. If not substantially improving/resolving within 2 weeks or if anything worsens, she is to seek in-person care.   Follow Up Instructions: I discussed the assessment and treatment plan with the patient. The patient was provided an opportunity to ask questions and all were answered. The patient agreed with the plan and demonstrated an understanding of the instructions.  A copy of instructions  were sent to the patient via MyChart unless otherwise noted below.   The patient was advised to call back or seek an in-person evaluation if the symptoms worsen or if the condition fails to improve as anticipated.  Time:  I spent 15 minutes with the patient via telehealth technology discussing the above problems/concerns.    Leeanne Rio, PA-C

## 2021-01-09 NOTE — Patient Instructions (Signed)
[name], thank you for joining Leeanne Rio, PA-C for today's virtual visit.  While this provider is not your primary care provider (PCP), if your PCP is located in our provider database this encounter information will be shared with them immediately following your visit.  Consent: (Patient) Kathleen Key provided verbal consent for this virtual visit at the beginning of the encounter.  Current Medications:  Current Outpatient Medications:    meloxicam (MOBIC) 7.5 MG tablet, Take 1 tablet (7.5 mg total) by mouth daily., Disp: 30 tablet, Rfl: 0   albuterol (VENTOLIN HFA) 108 (90 Base) MCG/ACT inhaler, Inhale 2 puffs every 6 hours if needed for breathing, Disp: 18 g, Rfl: 12   ADDERALL XR 20 MG 24 hr capsule, Take 2 capsules (40 mg total) by mouth daily., Disp: 60 capsule, Rfl: 0   amphetamine-dextroamphetamine (ADDERALL XR) 20 MG 24 hr capsule, Take 2 capsules (40 mg total) by mouth in the morning., Disp: 60 capsule, Rfl: 0   amphetamine-dextroamphetamine (ADDERALL XR) 20 MG 24 hr capsule, Take 2 capsules (40 mg total) by mouth every morning., Disp: 60 capsule, Rfl: 0   ARIPiprazole (ABILIFY) 2 MG tablet, Take 1 tablet (2 mg total) by mouth daily at 6 PM., Disp: 30 tablet, Rfl: 1   B Complex CAPS, Take by mouth daily., Disp: , Rfl:    estradiol (ESTRACE) 2 MG tablet, TAKE 2 TABLETS BY MOUTH EVERY DAY, Disp: 60 tablet, Rfl: 12   estradiol (ESTRACE) 2 MG tablet, TAKE 1 TABLET BY MOUTH EVERY DAY, Disp: 30 tablet, Rfl: 12   eszopiclone (LUNESTA) 2 MG TABS tablet, Take 1 tablet (2 mg total) by mouth immediately before bedtime as needed, Disp: 30 tablet, Rfl: 1   FAMOTIDINE PO, Take by mouth., Disp: , Rfl:    loratadine (CLARITIN) 10 MG tablet, Take 10 mg by mouth in the morning and at bedtime., Disp: , Rfl:    Multiple Vitamins-Minerals (EYE VITAMINS) CAPS, Take by mouth daily., Disp: , Rfl:    polyethylene glycol (MIRALAX / GLYCOLAX) packet, Take 17 g by mouth 3 (three) times daily as  needed., Disp: 14 each, Rfl: 0   progesterone (PROMETRIUM) 100 MG capsule, Take 1 capsule (100 mg total) by mouth daily., Disp: 30 capsule, Rfl: 11   sertraline (ZOLOFT) 100 MG tablet, Take 2 tablets (200 mg total) by mouth daily., Disp: 180 tablet, Rfl: 0   Medications ordered in this encounter:  Meds ordered this encounter  Medications   meloxicam (MOBIC) 7.5 MG tablet    Sig: Take 1 tablet (7.5 mg total) by mouth daily.    Dispense:  30 tablet    Refill:  0    Order Specific Question:   Supervising Provider    Answer:   Sabra Heck, BRIAN [[id number]]     *If you need refills on other medications prior to your next appointment, please contact your pharmacy*  Follow-Up: Call back or seek an in-person evaluation if the symptoms worsen or if the condition fails to improve as anticipated.  Other Instructions Please elevate leg while resting. You can apply ice for about 10 minutes a couple of times per day for the next 3 days. Once swelling has improved, ice will not be very beneficial. Make sure to wear supportive footwear.  Take the Meloxicam once daily with food. Tylenol arthritis for breakthrough pain. Wear the knee sleeve/ace wrap daily as discussed. Follow recommendations below.  If symptoms are not improving, anything worsens or new symptoms develop, you need to  be evaluated in person.  Patellofemoral Pain Syndrome Patellofemoral pain syndrome is a condition in which the tissue (cartilage) on the underside of the kneecap (patella) softens or breaks down. This causes pain in the front of the knee. The condition is also called runner's knee or chondromalacia patella. Patellofemoral pain syndrome is most common in young adults who are active in sports. The knee is the largest joint in the body. The patella covers the front of the knee and is attached to muscles above and below the knee. The underside of the patella is covered with a smooth type of cartilage (synovium). The smooth surface helps  the patella glide easily when you move your knee. Patellofemoral pain syndrome causes swelling in the joint linings and bone surfaces in the knee. What are the causes? This condition may be caused by: Overuse of the knee. Poor alignment of your knee joints. Weak leg muscles. A direct hit to your kneecap. What increases the risk? You are more likely to develop this condition if: You do a lot of activities that can wear down your kneecap. These include: Running. Squatting. Climbing stairs. You start a new physical activity or exercise program. You wear shoes that do not fit well. You do not have good leg strength. You are overweight. What are the signs or symptoms? The main symptom of this condition is knee pain. This may feel like a dull, aching pain underneath your patella, in the front of your knee. There may be a popping or cracking sound when you move your knee. Pain may get worse with: Exercise. Climbing stairs. Running. Jumping. Squatting. Kneeling. Sitting for a long time. Moving or pushing on your patella. How is this diagnosed? This condition may be diagnosed based on: Your symptoms and medical history. You may be asked about your recent physical activities and which ones cause knee pain. A physical exam. This may include: Moving your patella back and forth. Checking your range of knee motion. Having you squat or jump to see if you have pain. Checking the strength of your leg muscles. Imaging tests to confirm the diagnosis. These may include an MRI of your knee. How is this treated? This condition may be treated at home with rest, ice, compression, and elevation (RICE).  Other treatments may include: NSAIDs, such as ibuprofen. Physical therapy to stretch and strengthen your leg muscles. Shoe inserts (orthotics) to take stress off your knee. A knee brace or knee support. Adhesive tapes to the skin. Surgery to remove damaged cartilage or move the patella to a better  position. This is rare. Follow these instructions at home: If you have a brace: Wear the brace as told by your health care provider. Remove it only as told by your health care provider. Loosen the brace if your toes tingle, become numb, or turn cold and blue. Keep the brace clean. If the brace is not waterproof: Do not let it get wet. Cover it with a watertight covering when you take a bath or a shower. Managing pain, stiffness, and swelling  If directed, put ice on the painful area. To do this: If you have a removable brace, remove it as told by your health care provider. Put ice in a plastic bag. Place a towel between your skin and the bag. Leave the ice on for 20 minutes, 2-3 times a day. Remove the ice if your skin turns bright red. This is very important. If you cannot feel pain, heat, or cold, you have a greater risk  of damage to the area. Move your toes often to reduce stiffness and swelling. Raise (elevate) the injured area above the level of your heart while you are sitting or lying down. Activity Rest your knee. Avoid activities that cause knee pain. Perform stretching and strengthening exercises as told by your health care provider or physical therapist. Return to your normal activities as told by your health care provider. Ask your health care provider what activities are safe for you. General instructions Take over-the-counter and prescription medicines only as told by your health care provider. Use splints, braces, knee supports, or walking aids as directed by your health care provider. Do not use any products that contain nicotine or tobacco, such as cigarettes, e-cigarettes, and chewing tobacco. These can delay healing. If you need help quitting, ask your health care provider. Keep all follow-up visits. This is important. Contact a health care provider if: Your symptoms get worse. You are not improving with home care. Summary Patellofemoral pain syndrome is a  condition in which the tissue (cartilage) on the underside of the kneecap (patella) softens or breaks down. This condition causes swelling in the joint linings and bone surfaces in the knee. This leads to pain in the front of the knee. This condition may be treated at home with rest, ice, compression, and elevation (RICE). Use splints, braces, knee supports, or walking aids as directed by your health care provider. This information is not intended to replace advice given to you by your health care provider. Make sure you discuss any questions you have with your health care provider. Document Revised: 09/01/2019 Document Reviewed: 09/01/2019 Elsevier Patient Education  2022 [employer].    If you have been instructed to have an in-person evaluation today at a local Urgent Care facility, please use the link below. It will take you to a list of all of our available [facility], including address, phone number and hours of operation. Please do not delay care.  [facility]  If you or a family member do not have a primary care provider, use the link below to schedule a visit and establish care. When you choose a [facility] primary care physician or advanced practice provider, you gain a long-term partner in health. Find a Primary Care Provider  Learn more about [facility]'s in-office and virtual care options: [facility]

## 2021-01-19 ENCOUNTER — Other Ambulatory Visit (HOSPITAL_COMMUNITY): Payer: Self-pay

## 2021-01-19 MED ORDER — AMPHETAMINE-DEXTROAMPHET ER 20 MG PO CP24
40.0000 mg | ORAL_CAPSULE | Freq: Every morning | ORAL | 0 refills | Status: DC
  Filled 2021-01-19: qty 60, 30d supply, fill #0

## 2021-01-22 ENCOUNTER — Other Ambulatory Visit (HOSPITAL_COMMUNITY): Payer: Self-pay

## 2021-01-24 DIAGNOSIS — H52203 Unspecified astigmatism, bilateral: Secondary | ICD-10-CM | POA: Diagnosis not present

## 2021-01-31 ENCOUNTER — Other Ambulatory Visit (HOSPITAL_COMMUNITY): Payer: Self-pay

## 2021-02-01 ENCOUNTER — Other Ambulatory Visit: Payer: Self-pay

## 2021-02-01 ENCOUNTER — Ambulatory Visit: Payer: 59

## 2021-02-01 ENCOUNTER — Other Ambulatory Visit (HOSPITAL_COMMUNITY): Payer: Self-pay

## 2021-02-01 ENCOUNTER — Ambulatory Visit: Payer: 59 | Admitting: Orthopedic Surgery

## 2021-02-01 ENCOUNTER — Encounter: Payer: Self-pay | Admitting: Orthopedic Surgery

## 2021-02-01 VITALS — Ht 64.5 in | Wt 134.0 lb

## 2021-02-01 DIAGNOSIS — S83282A Other tear of lateral meniscus, current injury, left knee, initial encounter: Secondary | ICD-10-CM

## 2021-02-01 DIAGNOSIS — M25562 Pain in left knee: Secondary | ICD-10-CM

## 2021-02-01 DIAGNOSIS — G8929 Other chronic pain: Secondary | ICD-10-CM

## 2021-02-01 NOTE — Progress Notes (Signed)
EVALUATION AND MANAGEMENT   Type of appointment : New patient evaluation  PLAN: rec mri left knee probably has lateral men tear   No orders of the defined types were placed in this encounter.    Chief Complaint  Patient presents with   Knee Pain    Lt knee pain for 1.5 months. Has had the feeling the knee may give way. Worse after working and with bending.     55 years old 1-1/2 months pain left knee knee feels like it will give way no trauma occasional swelling feels like the whole knee hurts.  Took some Tylenol for pain.  Wears a knee brace.   Review of Systems  All other systems reviewed and are negative.   Body mass index is 22.65 kg/m.  Physical Exam Constitutional:      General: She is not in acute distress.    Appearance: She is well-developed.     Comments: Well developed, well nourished Normal grooming and hygiene     Cardiovascular:     Comments: No peripheral edema Musculoskeletal:     Left knee: No swelling, deformity, effusion, erythema, ecchymosis, lacerations or crepitus. Normal range of motion. Tenderness present over the medial joint line and lateral joint line. No patellar tendon tenderness. No ACL laxity or PCL laxity.Abnormal meniscus. Normal alignment. Normal pulse.     Instability Tests: Anterior drawer test negative. Posterior drawer test negative. Lateral McMurray test positive.  Skin:    General: Skin is warm and dry.  Neurological:     Mental Status: She is alert and oriented to person, place, and time.     Sensory: No sensory deficit.     Coordination: Coordination normal.     Gait: Gait normal.     Deep Tendon Reflexes: Reflexes are normal and symmetric.  Psychiatric:        Mood and Affect: Mood normal.        Behavior: Behavior normal.        Thought Content: Thought content normal.        Judgment: Judgment normal.     Comments: Affect normal     Past Medical History:  Diagnosis Date   ADD (attention deficit disorder)    Asthma,  cough variant    pulmonology--- dr c. young   GAD (generalized anxiety disorder)    GERD (gastroesophageal reflux disease)    History of 2019 novel coronavirus disease (COVID-19) 01/05/2019   per pt positive results done at [facility] , Dr Lavone Orn,  per pt mild symptoms resolved in one week   History of urinary retention 08/2012   due to uterine retroverted s/p retroversion and hysterectomy   History of uterine fibroid    Irritable bowel syndrome with constipation    MDD (major depressive disorder)    Ovarian mass    Pelvic pain    Pulmonary sarcoidosis ([place]) 2014   followed by dr c. young-- dx 2014 lung nodules,  clinically inactive and stable   Shortness of breath dyspnea    Past Surgical History:  Procedure Laterality Date   ABDOMINAL HYSTERECTOMY N/A 10/13/2012   Procedure: HYSTERECTOMY ABDOMINAL;  Surgeon: Jonnie Kind, MD;  Location: [facility];  Service: Gynecology;  Laterality: N/A;   BILATERAL SALPINGECTOMY Bilateral 10/13/2012   Procedure: BILATERAL SALPINGECTOMY;  Surgeon: Jonnie Kind, MD;  Location: [facility];  Service: Gynecology;  Laterality: Bilateral;   BREAST EXCISIONAL BIOPSY Left 05/2015   BREAST LUMPECTOMY WITH RADIOACTIVE SEED LOCALIZATION Left 07/28/2015   Procedure:  LEFT BREAST LUMPECTOMY WITH RADIOACTIVE SEED LOCALIZATION;  Surgeon: Autumn Messing III, MD;  Location: [place];  Service: General;  Laterality: Left;   LAPAROSCOPIC BILATERAL SALPINGO OOPHERECTOMY Bilateral 12/29/2019   Procedure: LAPAROSCOPIC BILATERAL OOPHORECTOMY WITH ENDOCATCH;  Surgeon: Louretta Shorten, MD;  Location: [facility];  Service: Gynecology;  Laterality: Bilateral;   ULNAR NERVE TRANSPOSITION Left 05/2019   WISDOM TOOTH EXTRACTION     Dr. Georgina Snell office-[facility], [place], [place]   Social History   Tobacco Use   Smoking status: Never   Smokeless tobacco: Never  Vaping Use   Vaping Use: Never used  Substance Use Topics   Alcohol use: Yes     Comment: occassional   Drug use: Never     Assessment and Plan:  Encounter Diagnoses  Name Primary?   Chronic pain of left knee Yes   Acute lateral meniscus tear of left knee, initial encounter     Appears to have a lateral meniscus tear with mechanical symptoms recommend MRI.  We discussed possible surgery if the tear remains symptomatic  She can continue with ibuprofen and knee sleeve until diagnosis is obtained

## 2021-02-01 NOTE — Patient Instructions (Signed)
While we are working on your approval for MRI please go ahead and call to schedule your appointment with [facility] within at least one (1) week.   Central Scheduling [phone]  

## 2021-02-02 ENCOUNTER — Other Ambulatory Visit (HOSPITAL_COMMUNITY): Payer: Self-pay

## 2021-02-02 MED ORDER — ARIPIPRAZOLE 2 MG PO TABS
2.0000 mg | ORAL_TABLET | Freq: Every day | ORAL | 3 refills | Status: DC
  Filled 2021-02-02: qty 30, 30d supply, fill #0
  Filled 2021-03-12: qty 30, 30d supply, fill #1
  Filled 2021-04-09: qty 30, 30d supply, fill #2
  Filled 2021-05-17: qty 30, 30d supply, fill #3

## 2021-02-05 ENCOUNTER — Other Ambulatory Visit (HOSPITAL_COMMUNITY): Payer: Self-pay

## 2021-02-05 MED ORDER — ESZOPICLONE 2 MG PO TABS
2.0000 mg | ORAL_TABLET | Freq: Every day | ORAL | 1 refills | Status: DC
  Filled 2021-02-05: qty 30, 30d supply, fill #0

## 2021-02-06 ENCOUNTER — Other Ambulatory Visit (HOSPITAL_COMMUNITY): Payer: Self-pay

## 2021-02-08 ENCOUNTER — Ambulatory Visit: Payer: 59 | Admitting: Obstetrics & Gynecology

## 2021-02-08 ENCOUNTER — Encounter: Payer: Self-pay | Admitting: Obstetrics & Gynecology

## 2021-02-08 ENCOUNTER — Other Ambulatory Visit: Payer: Self-pay

## 2021-02-08 ENCOUNTER — Other Ambulatory Visit (HOSPITAL_COMMUNITY): Payer: Self-pay

## 2021-02-08 VITALS — BP 125/83 | HR 83 | Ht 64.0 in | Wt 138.0 lb

## 2021-02-08 DIAGNOSIS — N951 Menopausal and female climacteric states: Secondary | ICD-10-CM

## 2021-02-08 DIAGNOSIS — Z79890 Hormone replacement therapy (postmenopausal): Secondary | ICD-10-CM | POA: Diagnosis not present

## 2021-02-08 MED ORDER — PROGESTERONE MICRONIZED 100 MG PO CAPS
200.0000 mg | ORAL_CAPSULE | Freq: Every day | ORAL | 4 refills | Status: DC
  Filled 2021-02-08: qty 180, 90d supply, fill #0
  Filled 2021-06-01: qty 180, 90d supply, fill #1
  Filled 2021-09-12: qty 180, 90d supply, fill #2
  Filled 2021-12-20: qty 180, 90d supply, fill #3

## 2021-02-08 NOTE — Progress Notes (Signed)
   GYN VISIT Patient name: Kathleen Key MRN [id number]  Date of birth: 02/28/1966 Chief Complaint:   Hormone replacement change (Sweating all the time )  History of Present Illness:   Kathleen Key is a 55 y.o. [place] female being seen today for the following concerns:  Vasomotor symptoms: This has been an ongoing issue, previously seen by Dr. Corinna Capra.  Currently on Estrace 2mg  twice daily and Prometrium 100mg  daily.  This has been increased to current dose overtime as she notes persistence of symptoms.  Mostly sweats- during the day at work and when getting ready for work.  Denies stress at work- she is active at work- making bed, getting supplies etc; not sedentary.    Rates her symptoms 10/10- miserable.  Not taking any herbal supplements.  Drinks about one soda per day.  Not exercising due to recent knee injury  Patient's last menstrual period was 10/08/2012.  Depression screen [facility] 2/9 02/08/2021  Decreased Interest 0  Down, Depressed, Hopeless 1  PHQ - 2 Score 1  Altered sleeping 3  Tired, decreased energy 1  Change in appetite 0  Feeling bad or failure about yourself  0  Trouble concentrating 2  Moving slowly or fidgety/restless 0  Suicidal thoughts 0  PHQ-9 Score 7     Review of Systems:   Pertinent items are noted in HPI Denies fever/chills, dizziness, headaches, visual disturbances, fatigue, shortness of breath, chest pain, abdominal pain, vomiting, bowel movements, urination, or intercourse unless otherwise stated above.  Pertinent History Reviewed:  Reviewed past medical,surgical, social, obstetrical and family history.  Reviewed problem list, medications and allergies. Physical Assessment:   Vitals:   02/08/21 1503  BP: 125/83  Pulse: 83  Weight: 138 lb (62.6 kg)  Height: 5\' 4"  (1.626 m)  Body mass index is 23.69 kg/m.       Physical Examination:   General appearance: alert, well appearing, and in no distress  Psych: mood appropriate, normal affect  Skin: warm  & dry   Cardiovascular: RRR  Respiratory: CTAB  Abdomen: soft, non-tender   Pelvic: examination not indicated  Extremities: no edema, no calf tenderness  Chaperone: N/A    Assessment & Plan:  1) Vasomotor symptoms -Unfortunately she is already on high dose estrogen -encouraged cutting back on caffeine and regular exercise, even yoga/stretching -Plan to increase progesterone to 100mg  daily -If within 6wks she does not have significant improvement would also recommend additional herbal supplements -IF no improvement in 3 mos, RTC may consider transition to patch or other form of HRT  -per pt prior thyroid work up already completed, followed by PCP regularly  Return in about 3 months (around 05/11/2021) for medication follow up ([place] for [employer]).   Janyth Pupa, DO [place], [facility] for [employer], [place]

## 2021-02-12 ENCOUNTER — Other Ambulatory Visit (HOSPITAL_COMMUNITY): Payer: Self-pay

## 2021-02-14 ENCOUNTER — Other Ambulatory Visit: Payer: Self-pay

## 2021-02-14 ENCOUNTER — Ambulatory Visit (HOSPITAL_COMMUNITY)
Admission: RE | Admit: 2021-02-14 | Discharge: 2021-02-14 | Disposition: A | Discharge status: Home / Self Care | Payer: 59 | Source: Ambulatory Visit | Attending: Orthopedic Surgery | Admitting: Orthopedic Surgery

## 2021-02-14 DIAGNOSIS — M25562 Pain in left knee: Secondary | ICD-10-CM | POA: Diagnosis not present

## 2021-02-14 DIAGNOSIS — G8929 Other chronic pain: Secondary | ICD-10-CM | POA: Insufficient documentation

## 2021-02-15 ENCOUNTER — Other Ambulatory Visit (HOSPITAL_COMMUNITY): Payer: Self-pay

## 2021-02-15 ENCOUNTER — Ambulatory Visit: Payer: 59 | Admitting: Orthopedic Surgery

## 2021-02-15 DIAGNOSIS — J4520 Mild intermittent asthma, uncomplicated: Secondary | ICD-10-CM | POA: Insufficient documentation

## 2021-02-15 DIAGNOSIS — N951 Menopausal and female climacteric states: Secondary | ICD-10-CM | POA: Insufficient documentation

## 2021-02-15 DIAGNOSIS — G8929 Other chronic pain: Secondary | ICD-10-CM | POA: Diagnosis not present

## 2021-02-15 DIAGNOSIS — K5900 Constipation, unspecified: Secondary | ICD-10-CM | POA: Insufficient documentation

## 2021-02-15 DIAGNOSIS — M25562 Pain in left knee: Secondary | ICD-10-CM

## 2021-02-15 DIAGNOSIS — K589 Irritable bowel syndrome without diarrhea: Secondary | ICD-10-CM | POA: Insufficient documentation

## 2021-02-15 DIAGNOSIS — F325 Major depressive disorder, single episode, in full remission: Secondary | ICD-10-CM | POA: Insufficient documentation

## 2021-02-15 DIAGNOSIS — S83282D Other tear of lateral meniscus, current injury, left knee, subsequent encounter: Secondary | ICD-10-CM | POA: Diagnosis not present

## 2021-02-15 DIAGNOSIS — E2839 Other primary ovarian failure: Secondary | ICD-10-CM | POA: Insufficient documentation

## 2021-02-15 DIAGNOSIS — K219 Gastro-esophageal reflux disease without esophagitis: Secondary | ICD-10-CM | POA: Insufficient documentation

## 2021-02-15 MED ORDER — MELOXICAM 7.5 MG PO TABS
7.5000 mg | ORAL_TABLET | Freq: Every day | ORAL | 5 refills | Status: DC
  Filled 2021-02-15: qty 30, 30d supply, fill #0

## 2021-02-15 NOTE — Patient Instructions (Signed)
Change from ibuprofen to meloxicam   Meds ordered this encounter  Medications   meloxicam (MOBIC) 7.5 MG tablet    Sig: Take 1 tablet (7.5 mg total) by mouth daily.    Dispense:  30 tablet    Refill:  5     You have received an injection of steroids into the joint. 15% of patients will have increased pain within the 24 hours postinjection.   This is transient and will go away.   We recommend that you use ice packs on the injection site for 20 minutes every 2 hours and extra strength Tylenol 2 tablets every 8 as needed until the pain resolves.  If you continue to have pain after taking the Tylenol and using the ice please call the office for further instructions.

## 2021-02-15 NOTE — Progress Notes (Signed)
  Chief Complaint  Patient presents with   Knee Pain    LT knee MRI results LT knee Pain about the same   Encounter Diagnoses  Name Primary?   Chronic pain of left knee Yes   Tear of lateral meniscus of left knee, current, unspecified tear type, subsequent encounter     Follow-up MRI results  I read the MRI.  I do not see a discrete tear in the lateral meniscus I do see some inflammatory changes there  I reviewed these images with the patient  MRI report FINDINGS: MENISCI   Medial meniscus:  Intact with normal morphology.   Lateral meniscus: There is prominent degenerative signal peripherally in the meniscal body and anterior horn with parameniscal inflammation extending into Hoffa's fat. No well-defined parameniscal cyst, discrete meniscal tear or displaced meniscal fragment.   Based on her symptoms of lateral pain and pain with twisting in the lateral joint I think she may have an unknown recognized meniscal tear of the lateral meniscus  However we are going to try an injection and switch from ibuprofen to meloxicam and continue her brace and see her in 4 weeks  If she just makes no improvement we may be forced to go ahead and do an arthroscopic procedure  Inject left knee joint  Procedure note left knee injection   verbal consent was obtained to inject left knee joint  Timeout was completed to confirm the site of injection  The medications used were Depo-Medrol 40 mg with 3 cc 1% lidocaine  Anesthesia was provided by ethyl chloride and the skin was prepped with alcohol.  After cleaning the skin with alcohol a 20-gauge needle was used to inject the left knee joint. There were no complications. A sterile bandage was applied.

## 2021-02-16 ENCOUNTER — Other Ambulatory Visit (HOSPITAL_COMMUNITY): Payer: Self-pay

## 2021-03-02 ENCOUNTER — Other Ambulatory Visit (HOSPITAL_COMMUNITY): Payer: Self-pay

## 2021-03-02 MED FILL — Estradiol Tab 2 MG: ORAL | 30 days supply | Qty: 60 | Fill #5 | Status: AC

## 2021-03-05 ENCOUNTER — Other Ambulatory Visit (HOSPITAL_COMMUNITY): Payer: Self-pay

## 2021-03-05 MED ORDER — AMPHETAMINE-DEXTROAMPHET ER 20 MG PO CP24
40.0000 mg | ORAL_CAPSULE | Freq: Every morning | ORAL | 0 refills | Status: DC
  Filled 2021-03-05: qty 60, 30d supply, fill #0

## 2021-03-06 ENCOUNTER — Other Ambulatory Visit (HOSPITAL_COMMUNITY): Payer: Self-pay

## 2021-03-06 MED ORDER — ESTRADIOL 2 MG PO TABS
4.0000 mg | ORAL_TABLET | Freq: Every day | ORAL | 3 refills | Status: DC
  Filled 2021-03-06 – 2021-04-24 (×2): qty 180, 90d supply, fill #0
  Filled 2021-10-08: qty 180, 90d supply, fill #1

## 2021-03-12 ENCOUNTER — Other Ambulatory Visit (HOSPITAL_COMMUNITY): Payer: Self-pay

## 2021-03-14 NOTE — Progress Notes (Signed)
HPI F never smoker followed for lung nodules, asthma/cough, sarcoid based on ACE-no bx ACE 06/02/13 76( 8-52)   Quant TB Gold 06/02/13- 0.2 neg FENO 08/07/16- She was unable to perform- unable to maintain stable airflow PFT 10/23/16-WNL ACE 08/07/16- 81 H (9-67)  BMET, LFTs and CBC were all normal. CT chest 07/2016 showed some improvement in small lung nodules, nothing worse. Still consistent with sarcoid. She had dropped off Plaquenil and will remain off  ---------------------------------------------------------------------------------------    11/11/19- 54 yoF never smoker followed for Lung Nodules, Asthma/Cough, Sarcoid based on rash and  ACE-no bx,  complicated by  Depression, ADD, Covid infection 12/2018, Aortic Atherosclerosis, Meds include Adderall, neb xopenex, albuterol hfa,  lorazepam, trazodone, Doing well since recovery from Covid, with less cough and no recent need for inhaled meds. Sarcoid has been clinically inactive, supported by stability of nodules and bronchiectatic changes on CT chest- reviewed. Not using any inhalers now. Tramadol made her itch, as do all narcotics- important because she is pending oophorectomy. CT chest 05/14/19- IMPRESSION: 1. Stable exam. Unchanged appearance of perilymphatic distribution nodularity compatible with the clinical history of sarcoid. Architectural distortion and nodularity within the right upper lobe is unchanged from previous exam. 2. No thoracic adenopathy. Aortic Atherosclerosis (ICD10-I70.0).  03/15/21- 55 yoF never smoker followed for Lung Nodules, Asthma/Cough, Sarcoid based on rash and  ACE-no bx,  complicated by  Depression, ADD, Covid infection 12/2018, Aortic Atherosclerosis, -Adderall, neb xopenex, albuterol hfa,  lunesta, abilify, zoloft,  Covid vax- 2 Flu vax-had ACT score 23 -----Patient is going good no concerns Kathleen Key denies rash, adenopathy, fever or sweats and has had no symptoms suggestive of active systemic sarcoid. Asthma  is well controlled with current meds.  Uses albuterol rescue inhaler occasionally. Working now as a Passenger transport manager at [employer].  She lives in [place] and is trying to consolidate her medical care there.  I have suggested that she work through her primary physician for medication refills.  We will be happy to see her again if we can help.    ROS-see HPI    "+" = positive Constitutional:    weight loss, night sweats, fevers, chills, fatigue, lassitude. HEENT:    headaches, difficulty swallowing, tooth/dental problems, sore throat,       sneezing, itching, ear ache, +nasal congestion, post nasal drip, snoring CV:    chest pain, orthopnea, PND, swelling in lower extremities, anasarca,                          dizziness, palpitations Resp:  + shortness of breath with exertion or at rest.                productive cough,  + non-productive cough, coughing up of blood.              change in color of mucus.  wheezing.   Skin:    +tiny intradermal inclusion nodule L upper ant chest wall near clavicle GI:  No-   heartburn, indigestion, abdominal pain, nausea, vomiting, diarrhea,                 change in bowel habits, loss of appetite GU: dysuria, change in color of urine, no urgency or frequency.   flank pain. MS:   joint pain, stiffness, decreased range of motion, back pain. Neuro-     nothing unusual Psych:  change in mood or affect.  depression or anxiety.   memory loss.  OBJ-  Physical Exam General- Alert, Oriented, Affect-appropriate, Distress- none acute, + slender Skin- rash-none, lesions- none, excoriation- none Lymphadenopathy- none Head- atraumatic            Eyes- Gross vision intact, PERRLA, conjunctivae and secretions clear            Ears- Hearing, canals-normal            Nose- Clear, no-Septal dev, mucus, polyps, erosion, perforation             Throat- Mallampati II , mucosa clear , drainage- none, tonsils- atrophic Neck- flexible , trachea midline, no stridor ,  thyroid nl, carotid no bruit Chest - symmetrical excursion , unlabored           Heart/CV- RRR , no murmur , no gallop  , no rub, nl s1 s2                           - JVD- none , edema- none, stasis changes- none, varices- none           Lung- clear to P&A, wheeze- none, cough- none, dullness-none, rub- none           Chest wall-  Abd- No HSM Br/ Gen/ Rectal- Not done, not indicated Extrem- cyanosis- none, clubbing, none, atrophy- none, strength- nl Neuro- grossly intact to observation

## 2021-03-15 ENCOUNTER — Encounter: Payer: Self-pay | Admitting: Internal Medicine

## 2021-03-15 ENCOUNTER — Ambulatory Visit: Payer: 59 | Admitting: Internal Medicine

## 2021-03-15 ENCOUNTER — Other Ambulatory Visit: Payer: Self-pay

## 2021-03-15 ENCOUNTER — Other Ambulatory Visit (HOSPITAL_COMMUNITY): Payer: Self-pay

## 2021-03-15 DIAGNOSIS — J4520 Mild intermittent asthma, uncomplicated: Secondary | ICD-10-CM

## 2021-03-15 DIAGNOSIS — D869 Sarcoidosis, unspecified: Secondary | ICD-10-CM

## 2021-03-15 MED ORDER — ALBUTEROL SULFATE HFA 108 (90 BASE) MCG/ACT IN AERS
2.0000 | INHALATION_SPRAY | Freq: Four times a day (QID) | RESPIRATORY_TRACT | 12 refills | Status: AC | PRN
  Filled 2021-03-15: qty 18, 25d supply, fill #0

## 2021-03-15 NOTE — Patient Instructions (Signed)
Refill script sent for Ventolin inhaler  Please call if we can help

## 2021-03-16 ENCOUNTER — Other Ambulatory Visit (HOSPITAL_COMMUNITY): Payer: Self-pay

## 2021-03-19 ENCOUNTER — Other Ambulatory Visit: Payer: Self-pay

## 2021-03-19 ENCOUNTER — Other Ambulatory Visit (HOSPITAL_COMMUNITY): Payer: Self-pay

## 2021-03-19 ENCOUNTER — Ambulatory Visit: Payer: 59 | Admitting: Orthopedic Surgery

## 2021-03-19 ENCOUNTER — Encounter: Payer: Self-pay | Admitting: Orthopedic Surgery

## 2021-03-19 DIAGNOSIS — M25562 Pain in left knee: Secondary | ICD-10-CM | POA: Diagnosis not present

## 2021-03-19 DIAGNOSIS — G8929 Other chronic pain: Secondary | ICD-10-CM

## 2021-03-19 DIAGNOSIS — S83282D Other tear of lateral meniscus, current injury, left knee, subsequent encounter: Secondary | ICD-10-CM | POA: Diagnosis not present

## 2021-03-19 NOTE — Progress Notes (Signed)
Chief Complaint  Patient presents with   Knee Pain    Left, doing well except can feel it when bending it    Kathleen Key has had an MRI of her left knee there were some inflammatory changes near the lateral meniscus where she is having pain with deep flexion or squatting and bending  Exam shows no effusion she has full range of motion she has pain and tenderness along the lateral joint line with hyperflexion of the knee mild pain with McMurray's maneuver as well  We discussed possible options at this point  Since she is doing relatively well we will try to avoid surgery and see how she does over the next 3 months  If she has more significant symptoms then we can readdress surgery

## 2021-03-20 ENCOUNTER — Encounter: Payer: Self-pay | Admitting: Internal Medicine

## 2021-03-20 ENCOUNTER — Other Ambulatory Visit (HOSPITAL_COMMUNITY): Payer: Self-pay

## 2021-03-20 MED ORDER — SERTRALINE HCL 100 MG PO TABS
200.0000 mg | ORAL_TABLET | Freq: Every day | ORAL | 3 refills | Status: DC
  Filled 2021-03-20: qty 180, 90d supply, fill #0
  Filled 2021-06-21: qty 180, 90d supply, fill #1

## 2021-03-20 NOTE — Assessment & Plan Note (Signed)
Clinically inactive now and radiologically stable last year.  I do not think she needs active follow-up outside of normal primary care visits.  We will be happy to see her again if needed.

## 2021-03-20 NOTE — Assessment & Plan Note (Signed)
Well-controlled uncomplicated.  Current medications are appropriate and can be refilled when needed.  She is going to be establishing with a new primary physician in [place] and can have that person provide her refills.

## 2021-04-09 ENCOUNTER — Other Ambulatory Visit (HOSPITAL_COMMUNITY): Payer: Self-pay

## 2021-04-12 ENCOUNTER — Other Ambulatory Visit (HOSPITAL_COMMUNITY): Payer: Self-pay

## 2021-04-13 ENCOUNTER — Other Ambulatory Visit (HOSPITAL_COMMUNITY): Payer: Self-pay

## 2021-04-13 MED ORDER — AMPHETAMINE-DEXTROAMPHET ER 20 MG PO CP24
40.0000 mg | ORAL_CAPSULE | Freq: Every morning | ORAL | 0 refills | Status: DC
Start: 2021-04-13 — End: 2021-05-22
  Filled 2021-04-13: qty 60, 30d supply, fill #0

## 2021-04-24 ENCOUNTER — Other Ambulatory Visit (HOSPITAL_COMMUNITY): Payer: Self-pay

## 2021-04-29 IMAGING — MG DIGITAL DIAGNOSTIC BILAT W/ TOMO W/ CAD
6 of 10 series · 6 of 26 positions shown · non-contrast
Comparison: Previous exam(s).

CLINICAL DATA: 54-year-old female presenting for annual exam as
well as 1 year follow-up of probably benign left breast
calcifications.

EXAM:
DIGITAL DIAGNOSTIC BILATERAL MAMMOGRAM WITH TOMO AND CAD

[L CC]
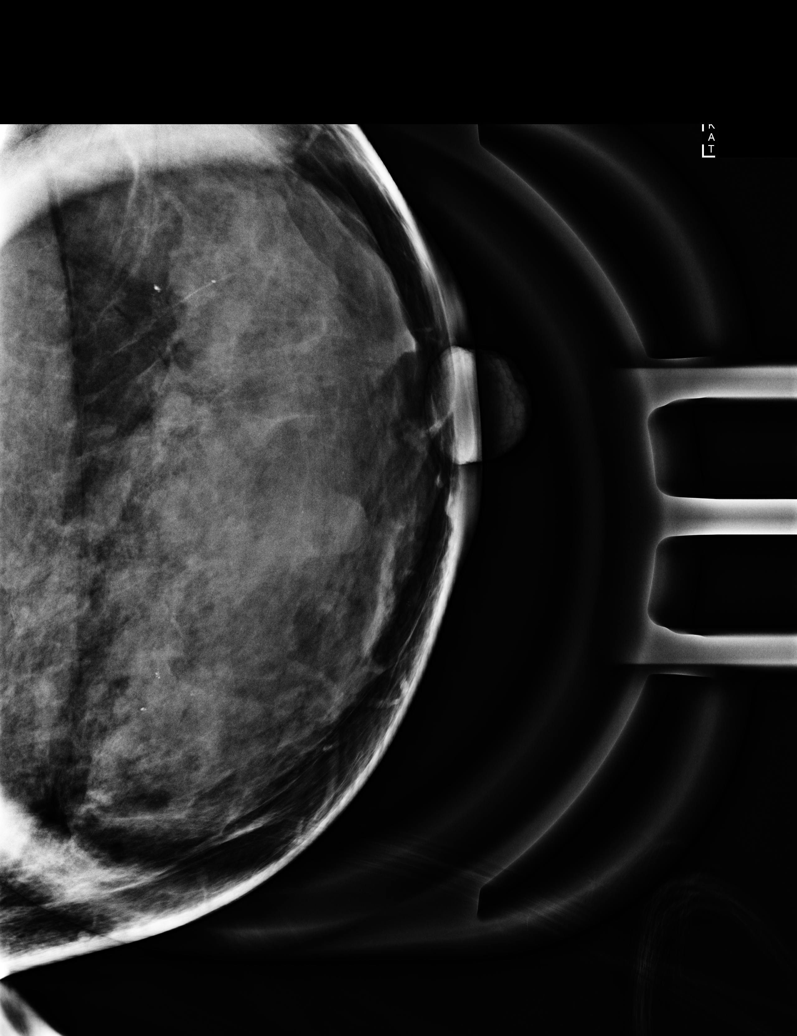

[L ML]
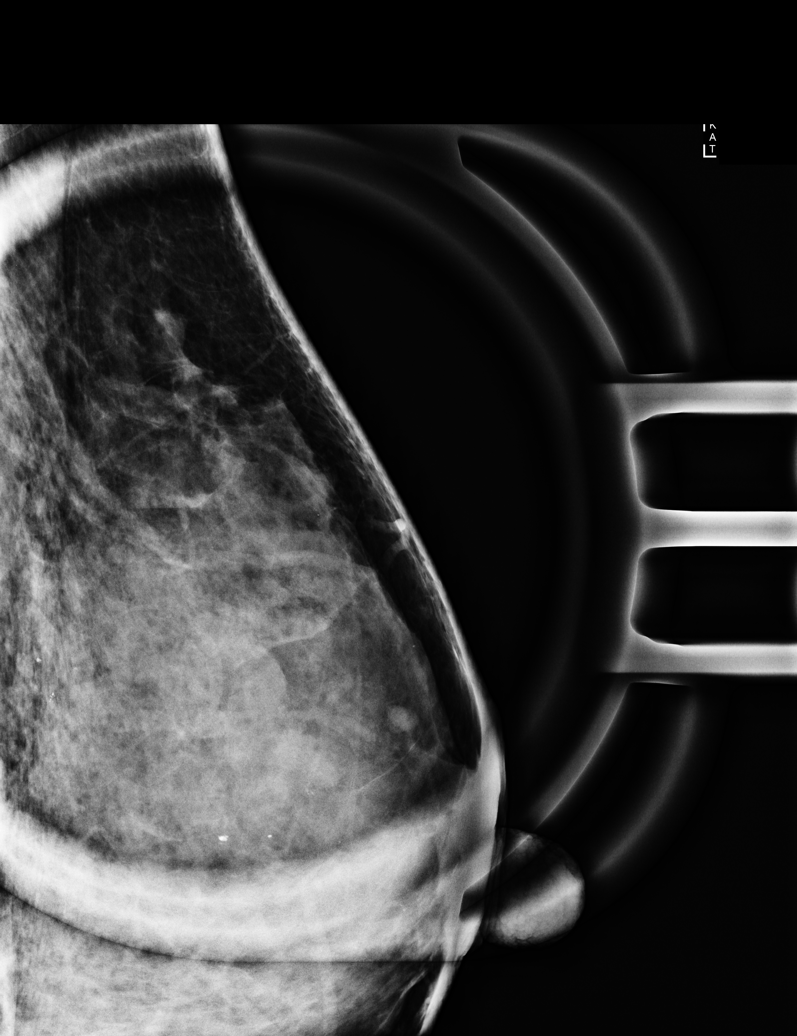

[L MLO synth-2D]
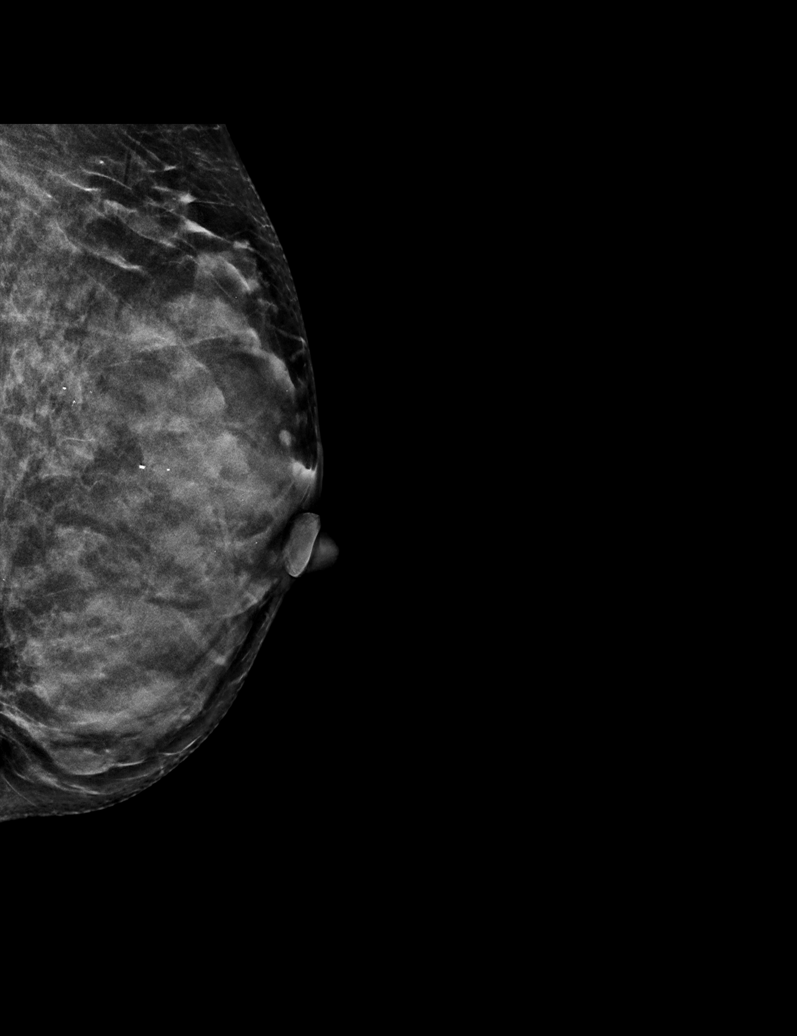

[L CC synth-2D]
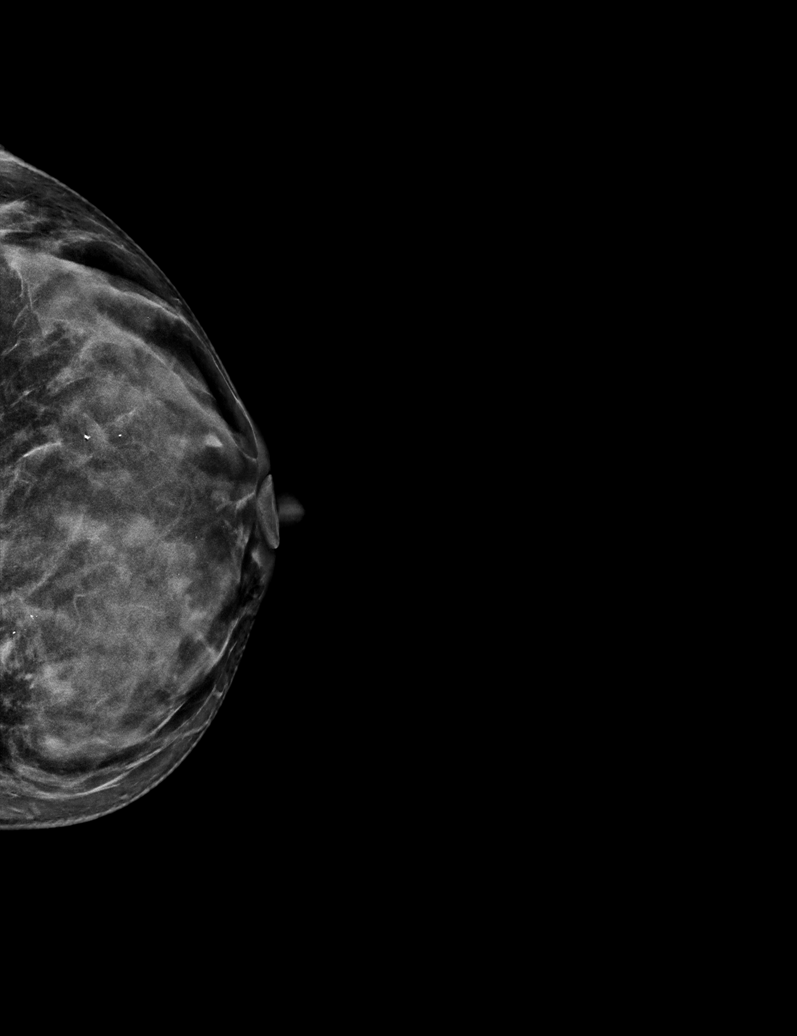

[R CC synth-2D]
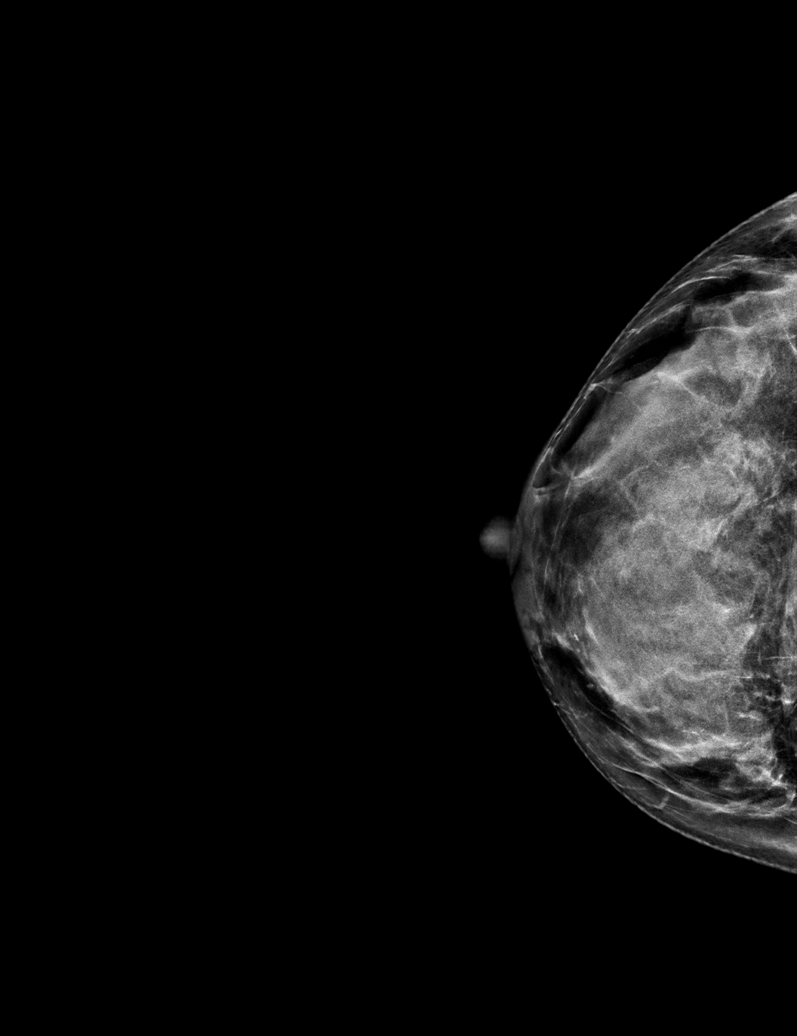

[R MLO synth-2D]
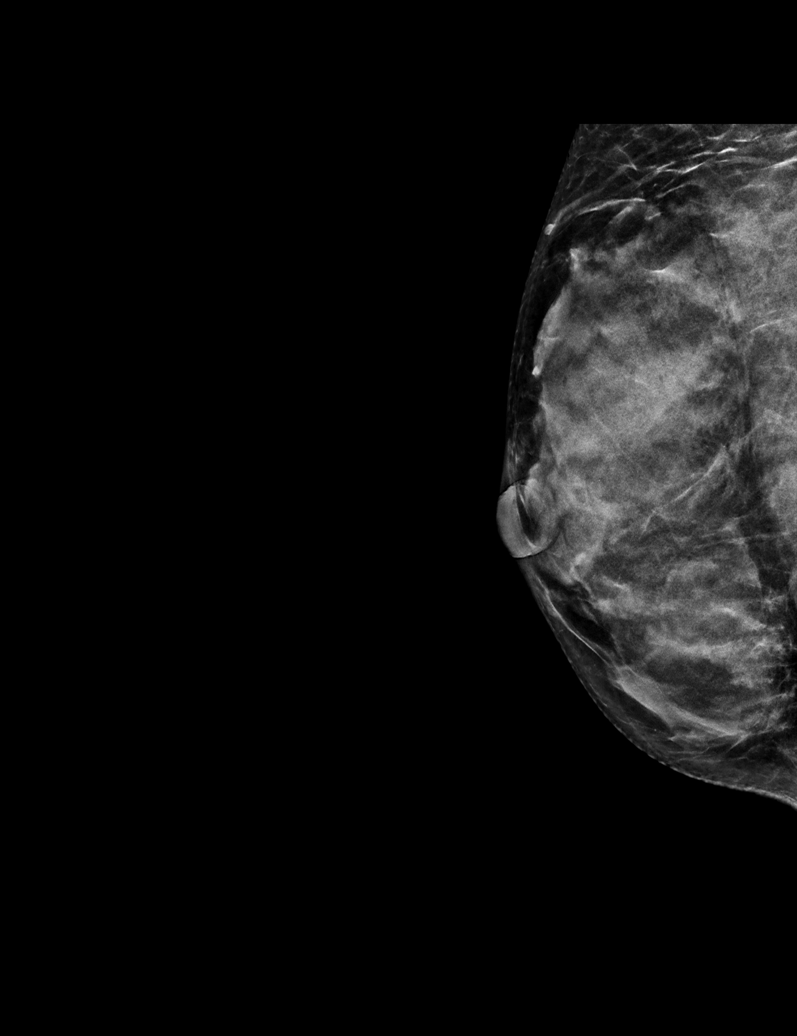

[6 of 26 positions shown; findings below may reference images not displayed]

ACR Breast Density Category d: The breast tissue is extremely dense,
which lowers the sensitivity of mammography.
FINDINGS: Right breast: No suspicious mass, distortion, or microcalcifications
are identified to suggest presence of malignancy.

Left breast: Spot 2D magnification views were performed in addition
to standard views. There is an unchanged group of coarse
heterogeneous calcifications in the upper inner quadrant measuring
approximately 0.6 cm. There are no new suspicious findings in the
left breast.

Mammographic images were processed with CAD.
IMPRESSION: 1.  No mammographic evidence of malignancy in the right breast.

2. Stable probably benign calcifications in the upper inner quadrant
of the left breast.

RECOMMENDATION:
Diagnostic bilateral mammogram in 1 year.

I have discussed the findings and recommendations with the patient.
If applicable, a reminder letter will be sent to the patient
regarding the next appointment.

BI-RADS CATEGORY  3: Probably benign.

## 2021-05-08 ENCOUNTER — Encounter: Payer: Self-pay | Admitting: Obstetrics & Gynecology

## 2021-05-08 ENCOUNTER — Ambulatory Visit: Payer: 59 | Admitting: Obstetrics & Gynecology

## 2021-05-08 DIAGNOSIS — Z79890 Hormone replacement therapy (postmenopausal): Secondary | ICD-10-CM | POA: Diagnosis not present

## 2021-05-08 DIAGNOSIS — N951 Menopausal and female climacteric states: Secondary | ICD-10-CM

## 2021-05-08 NOTE — Progress Notes (Signed)
° °  TELEHEALTH VIRTUAL GYN VISIT ENCOUNTER NOTE Patient name: Kathleen Key MRN [id number]  Date of birth: [birth year]  I connected with patient on 05/08/21 at  3:50 PM EST by phone and transferred to my phone from RN after proper verification.   Chief Complaint:   Follow-up  History of Present Illness:   Kathleen Key is a 56 y.o. G0P0000 PM, [place] female being evaluated today for follow up regarding HRT  -At her last visit, she noted 10/10 vasomotor symptoms despite Estrace 2mg  twice daily and prometrium 100mg  daily.  Most noticeable were the night sweats.  Plan was to increase prometrium to 200mg  daily.  Today she notes that it has helped somewhat.  Rates her symptoms 7/10.  Still getting the night sweats, but not as intense or frequent.  We had also discussed herbal supplements which she did not try.  She reports no other acute complaints.  Last visit had noted decreased exercise due to knee injury, she has gotten back to walking.  Depression screen PHQ 2/9 02/08/2021  Decreased Interest 0  Down, Depressed, Hopeless 1  PHQ - 2 Score 1  Altered sleeping 3  Tired, decreased energy 1  Change in appetite 0  Feeling bad or failure about yourself  0  Trouble concentrating 2  Moving slowly or fidgety/restless 0  Suicidal thoughts 0  PHQ-9 Score 7    Patient's last menstrual period was 10/08/2012.  Review of Systems:   Pertinent items are noted in HPI Denies fever/chills, dizziness, headaches, visual disturbances, fatigue, shortness of breath, chest pain, abdominal pain, vomiting, abnormal vaginal discharge/itching/odor/irritation, bowel movements, urination, or intercourse unless otherwise stated above.  Pertinent History Reviewed:  Reviewed past medical,surgical, social, obstetrical and family history.  Reviewed problem list, medications and allergies. Physical Assessment:  There were no vitals filed for this visit.There is no height or weight on file to calculate BMI.        Physical Examination:   General:  Alert, oriented and cooperative.   Mental Status: Normal mood and affect perceived. Normal judgment and thought content.  Physical exam deferred due to nature of the encounter  No results found for this or any previous visit (from the past 24 hour(s)).  Assessment & Plan:  1) HRT -for now plan to continue with Estrace 2mg  daily and Promterium 200mg  daily -pt to start herbal supplement for the next few mos -if no improvement may consider adding Gabapentin to current medication -F/u prn or November for annual   I discussed the assessment and treatment plan with the patient. The patient was provided an opportunity to ask questions and all were answered. The patient agreed with the plan and demonstrated an understanding of the instructions.   The patient was advised to call back or seek an in-person evaluation/go to the ED if the symptoms worsen or if the condition fails to improve as anticipated.  I provided 10 minutes of non-face-to-face time during this encounter.   Otis Peak, DO [place], [facility] for [employer], [place]

## 2021-05-17 ENCOUNTER — Other Ambulatory Visit (HOSPITAL_COMMUNITY): Payer: Self-pay

## 2021-05-21 ENCOUNTER — Other Ambulatory Visit (HOSPITAL_COMMUNITY): Payer: Self-pay

## 2021-05-22 ENCOUNTER — Other Ambulatory Visit (HOSPITAL_COMMUNITY): Payer: Self-pay

## 2021-05-22 DIAGNOSIS — F3340 Major depressive disorder, recurrent, in remission, unspecified: Secondary | ICD-10-CM | POA: Diagnosis not present

## 2021-05-22 DIAGNOSIS — F900 Attention-deficit hyperactivity disorder, predominantly inattentive type: Secondary | ICD-10-CM | POA: Diagnosis not present

## 2021-05-22 DIAGNOSIS — Z79899 Other long term (current) drug therapy: Secondary | ICD-10-CM | POA: Diagnosis not present

## 2021-05-22 MED ORDER — DOXEPIN HCL 10 MG PO CAPS
10.0000 mg | ORAL_CAPSULE | Freq: Every evening | ORAL | 2 refills | Status: DC
  Filled 2021-05-22: qty 30, 30d supply, fill #0
  Filled 2021-07-10: qty 30, 30d supply, fill #1
  Filled 2021-08-13: qty 30, 30d supply, fill #2

## 2021-05-22 MED ORDER — ARIPIPRAZOLE 2 MG PO TABS
2.0000 mg | ORAL_TABLET | Freq: Every morning | ORAL | 0 refills | Status: DC
Start: 2021-05-22 — End: 2021-09-12
  Filled 2021-05-22 – 2021-06-18 (×2): qty 90, 90d supply, fill #0

## 2021-05-22 MED ORDER — SERTRALINE HCL 100 MG PO TABS
200.0000 mg | ORAL_TABLET | Freq: Every day | ORAL | 0 refills | Status: DC
  Filled 2021-05-22: qty 180, 90d supply, fill #0

## 2021-05-22 MED ORDER — AMPHETAMINE-DEXTROAMPHET ER 20 MG PO CP24
40.0000 mg | ORAL_CAPSULE | Freq: Every day | ORAL | 0 refills | Status: DC
  Filled 2021-05-22: qty 60, 30d supply, fill #0

## 2021-05-23 ENCOUNTER — Other Ambulatory Visit (HOSPITAL_COMMUNITY): Payer: Self-pay

## 2021-05-23 MED ORDER — AMPHETAMINE-DEXTROAMPHET ER 20 MG PO CP24
40.0000 mg | ORAL_CAPSULE | Freq: Every morning | ORAL | 0 refills | Status: DC
  Filled 2021-05-23 – 2021-06-27 (×2): qty 60, 30d supply, fill #0

## 2021-06-01 ENCOUNTER — Other Ambulatory Visit (HOSPITAL_COMMUNITY): Payer: Self-pay

## 2021-06-18 ENCOUNTER — Other Ambulatory Visit: Payer: Self-pay

## 2021-06-18 ENCOUNTER — Other Ambulatory Visit (HOSPITAL_COMMUNITY): Payer: Self-pay

## 2021-06-18 ENCOUNTER — Ambulatory Visit: Payer: 59 | Admitting: Orthopedic Surgery

## 2021-06-18 DIAGNOSIS — M25562 Pain in left knee: Secondary | ICD-10-CM | POA: Diagnosis not present

## 2021-06-18 DIAGNOSIS — G8929 Other chronic pain: Secondary | ICD-10-CM | POA: Diagnosis not present

## 2021-06-18 NOTE — Progress Notes (Signed)
Chief Complaint  ?Patient presents with  ? Knee Pain  ?  Left, doing better  ? ? ?Kathleen Key has improved regarding her left knee she had an injection she had an MRI we decided not to do surgery she has done well no problems at this point she is working without any issues not taking any medicine ? ?Reexamination of the knee showed no abnormalities the knee felt very stable and firm there is no palpable tenderness or swelling and she had full range of motion ? ?I released her to come back as needed ?

## 2021-06-21 ENCOUNTER — Other Ambulatory Visit (HOSPITAL_COMMUNITY): Payer: Self-pay

## 2021-06-27 ENCOUNTER — Other Ambulatory Visit (HOSPITAL_COMMUNITY): Payer: Self-pay

## 2021-07-02 ENCOUNTER — Encounter: Payer: Self-pay | Admitting: Family Medicine

## 2021-07-02 ENCOUNTER — Ambulatory Visit (INDEPENDENT_AMBULATORY_CARE_PROVIDER_SITE_OTHER): Payer: 59 | Admitting: Family Medicine

## 2021-07-02 ENCOUNTER — Other Ambulatory Visit (HOSPITAL_COMMUNITY): Payer: Self-pay

## 2021-07-02 VITALS — BP 127/75 | HR 82 | Temp 98.3°F | Ht 64.0 in | Wt 137.0 lb

## 2021-07-02 DIAGNOSIS — J988 Other specified respiratory disorders: Secondary | ICD-10-CM

## 2021-07-02 DIAGNOSIS — R059 Cough, unspecified: Secondary | ICD-10-CM

## 2021-07-02 MED ORDER — DOXYCYCLINE HYCLATE 100 MG PO TABS
100.0000 mg | ORAL_TABLET | Freq: Two times a day (BID) | ORAL | 0 refills | Status: DC
  Filled 2021-07-02: qty 14, 7d supply, fill #0

## 2021-07-02 MED ORDER — PREDNISONE 50 MG PO TABS
50.0000 mg | ORAL_TABLET | Freq: Every day | ORAL | 0 refills | Status: DC
  Filled 2021-07-02: qty 5, 5d supply, fill #0

## 2021-07-02 NOTE — Patient Instructions (Signed)
Rest. Fluids. ? ?Medication as prescribed. ? ?Awaiting COVID test results. ? ?Take care ? ?Dr. Lacinda Axon  ?

## 2021-07-03 ENCOUNTER — Other Ambulatory Visit: Payer: Self-pay | Admitting: Family Medicine

## 2021-07-03 ENCOUNTER — Other Ambulatory Visit (HOSPITAL_COMMUNITY): Payer: Self-pay

## 2021-07-03 ENCOUNTER — Encounter: Payer: Self-pay | Admitting: Family Medicine

## 2021-07-03 DIAGNOSIS — J988 Other specified respiratory disorders: Secondary | ICD-10-CM | POA: Insufficient documentation

## 2021-07-03 LAB — NOVEL CORONAVIRUS, NAA: SARS-CoV-2, NAA: DETECTED — AB

## 2021-07-03 MED ORDER — MOLNUPIRAVIR EUA 200MG CAPSULE
4.0000 | ORAL_CAPSULE | Freq: Two times a day (BID) | ORAL | 0 refills | Status: AC
  Filled 2021-07-03: qty 40, 5d supply, fill #0

## 2021-07-03 NOTE — Progress Notes (Signed)
? ?Subjective:  ?Patient ID: Kathleen Key, female    DOB: [birth year]  Age: 56 y.o. MRN: [id number] ? ?CC: ?Chief Complaint  ?Patient presents with  ? head congestion  ? Cough  ?  With SOB, Started friday  ? ? ?HPI: ? ?56 year old female with asthma, GERD, history of sarcoidosis presents for evaluation of the above. ? ?Patient states that she has been sick since Friday.  She has had sore throat, cough she is also had chills.  No documented fever.  She also reports associated congestion.  She states that she has had some shortness of breath.  She has not used her inhaler.  Currently afebrile.  She has been taking DayQuil and NyQuil without resolution. ? ?Patient Active Problem List  ? Diagnosis Date Noted  ? Respiratory infection 07/03/2021  ? Gastroesophageal reflux disease 02/15/2021  ? Irritable bowel syndrome 02/15/2021  ? Mild intermittent asthma 02/15/2021  ? Major depressive disorder, recurrent episode, mild ([place]) 05/26/2018  ? GAD (generalized anxiety disorder) 04/29/2018  ? ADD (attention deficit disorder) without hyperactivity 04/29/2018  ? Sarcoid ([place]) 12/09/2012  ? ? ?Social Hx   ?Social History  ? ?Socioeconomic History  ? Marital status: Married  ?  Spouse name: Not on file  ? Number of children: Not on file  ? Years of education: Not on file  ? Highest education level: Not on file  ?Occupational History  ? Not on file  ?Tobacco Use  ? Smoking status: Never  ? Smokeless tobacco: Never  ?Vaping Use  ? Vaping Use: Never used  ?Substance and Sexual Activity  ? Alcohol use: Not Currently  ?  Comment: occassional  ? Drug use: Never  ? Sexual activity: Yes  ?  Birth control/protection: Surgical  ?  Comment: hysterectomy  ?Other Topics Concern  ? Not on file  ?Social History Narrative  ? Not on file  ? ?Social Determinants of Health  ? ?Financial Resource Strain: Medium Risk  ? Difficulty of Paying Living Expenses: Somewhat hard  ?Food Insecurity: No Food Insecurity  ? Worried About Charity fundraiser in the  Last Year: Never true  ? Ran Out of Food in the Last Year: Never true  ?Transportation Needs: No Transportation Needs  ? Lack of Transportation (Medical): No  ? Lack of Transportation (Non-Medical): No  ?Physical Activity: Inactive  ? Days of Exercise per Week: 0 days  ? Minutes of Exercise per Session: 0 min  ?Stress: Stress Concern Present  ? Feeling of Stress : To some extent  ?Social Connections: Moderately Integrated  ? Frequency of Communication with Friends and Family: More than three times a week  ? Frequency of Social Gatherings with Friends and Family: Twice a week  ? Attends Religious Services: More than 4 times per year  ? Active Member of Clubs or Organizations: No  ? Attends Archivist Meetings: Never  ? Marital Status: Married  ? ? ?Review of Systems ?Per HPI ? ?Objective:  ?BP 127/75   Pulse 82   Temp 98.3 ?F (36.8 ?C) (Oral)   Ht '5\' 4"'$  (1.626 m)   Wt 137 lb (62.1 kg)   LMP 10/08/2012   SpO2 96%   BMI 23.52 kg/m?  ? ? ?  07/02/2021  ?  4:11 PM 03/15/2021  ?  4:20 PM 02/08/2021  ?  3:03 PM  ?BP/Weight  ?Systolic BP 409 811 914  ?Diastolic BP 75 72 83  ?Wt. (Lbs) 137 137.4 138  ?BMI 23.52 kg/m2 23.58  kg/m2 23.69 kg/m2  ? ? ?Physical Exam ?Vitals and nursing note reviewed.  ?Constitutional:   ?   General: She is not in acute distress. ?   Appearance: Normal appearance.  ?HENT:  ?   Head: Normocephalic and atraumatic.  ?   Right Ear: Tympanic membrane normal.  ?   Left Ear: Tympanic membrane normal.  ?   Mouth/Throat:  ?   Pharynx: Oropharynx is clear.  ?Eyes:  ?   General:     ?   Right eye: No discharge.     ?   Left eye: No discharge.  ?   Conjunctiva/sclera: Conjunctivae normal.  ?Cardiovascular:  ?   Rate and Rhythm: Normal rate and regular rhythm.  ?   Heart sounds: No murmur heard. ?Pulmonary:  ?   Effort: Pulmonary effort is normal.  ?   Breath sounds: Normal breath sounds. No wheezing, rhonchi or rales.  ?Neurological:  ?   Mental Status: She is alert.  ?Psychiatric:     ?   Mood  and Affect: Mood normal.     ?   Behavior: Behavior normal.  ? ? ?Lab Results  ?Component Value Date  ? WBC 3.7 (L) 12/28/2019  ? HGB 13.4 12/28/2019  ? HCT 41.1 12/28/2019  ? PLT 281 12/28/2019  ? GLUCOSE 77 12/28/2019  ? CHOL 194 03/07/2019  ? TRIG 78 03/07/2019  ? HDL 74 03/07/2019  ? LDLCALC 104 (H) 03/07/2019  ? ALT 19 03/18/2019  ? AST 23 03/18/2019  ? NA 139 12/28/2019  ? K 3.3 (L) 12/28/2019  ? CL 102 12/28/2019  ? CREATININE 0.54 12/28/2019  ? BUN 21 (H) 12/28/2019  ? CO2 27 12/28/2019  ? TSH 2.368 03/07/2019  ? HGBA1C 5.6 03/07/2019  ? ? ? ?Assessment & Plan:  ? ?Problem List Items Addressed This Visit   ? ?  ? Respiratory  ? Respiratory infection - Primary  ?  Patient reports that her job requires COVID testing.  Test was done today.  Awaiting results. ?Treating with prednisone and doxycycline. ?  ?  ? Relevant Orders  ? Novel Coronavirus, NAA ([facility])  ? ? ?Meds ordered this encounter  ?Medications  ? predniSONE (DELTASONE) 50 MG tablet  ?  Sig: Take 1 tablet (50 mg total) by mouth daily for 5 days.  ?  Dispense:  5 tablet  ?  Refill:  0  ? doxycycline (VIBRA-TABS) 100 MG tablet  ?  Sig: Take 1 tablet (100 mg total) by mouth 2 (two) times daily.  ?  Dispense:  14 tablet  ?  Refill:  0  ? ?Thersa Salt DO ?[place] ? ?

## 2021-07-03 NOTE — Assessment & Plan Note (Signed)
Patient reports that her job requires COVID testing.  Test was done today.  Awaiting results. ?Treating with prednisone and doxycycline. ?

## 2021-07-10 ENCOUNTER — Other Ambulatory Visit (HOSPITAL_COMMUNITY): Payer: Self-pay

## 2021-07-14 ENCOUNTER — Other Ambulatory Visit: Payer: Self-pay | Admitting: Physician Assistant

## 2021-07-14 DIAGNOSIS — M545 Low back pain, unspecified: Secondary | ICD-10-CM | POA: Diagnosis not present

## 2021-08-01 ENCOUNTER — Other Ambulatory Visit (HOSPITAL_COMMUNITY): Payer: Self-pay

## 2021-08-01 MED ORDER — AMPHETAMINE-DEXTROAMPHET ER 20 MG PO CP24
40.0000 mg | ORAL_CAPSULE | Freq: Every morning | ORAL | 0 refills | Status: DC
  Filled 2021-08-01: qty 60, 30d supply, fill #0

## 2021-08-02 ENCOUNTER — Other Ambulatory Visit (HOSPITAL_COMMUNITY): Payer: Self-pay

## 2021-08-02 DIAGNOSIS — M791 Myalgia, unspecified site: Secondary | ICD-10-CM | POA: Diagnosis not present

## 2021-08-02 DIAGNOSIS — M545 Low back pain, unspecified: Secondary | ICD-10-CM | POA: Diagnosis not present

## 2021-08-13 ENCOUNTER — Other Ambulatory Visit (HOSPITAL_COMMUNITY): Payer: Self-pay

## 2021-08-22 ENCOUNTER — Ambulatory Visit: Payer: 59 | Admitting: Orthopedic Surgery

## 2021-09-05 ENCOUNTER — Ambulatory Visit: Payer: 59 | Admitting: Orthopedic Surgery

## 2021-09-06 ENCOUNTER — Other Ambulatory Visit: Payer: Self-pay | Admitting: Family Medicine

## 2021-09-06 ENCOUNTER — Other Ambulatory Visit (HOSPITAL_COMMUNITY): Payer: Self-pay

## 2021-09-07 ENCOUNTER — Other Ambulatory Visit (HOSPITAL_COMMUNITY): Payer: Self-pay

## 2021-09-10 ENCOUNTER — Other Ambulatory Visit (HOSPITAL_COMMUNITY): Payer: Self-pay

## 2021-09-10 MED ORDER — AMPHETAMINE-DEXTROAMPHET ER 20 MG PO CP24
40.0000 mg | ORAL_CAPSULE | Freq: Every day | ORAL | 0 refills | Status: DC
  Filled 2021-09-10: qty 60, 30d supply, fill #0

## 2021-09-12 ENCOUNTER — Other Ambulatory Visit (HOSPITAL_COMMUNITY): Payer: Self-pay

## 2021-09-12 DIAGNOSIS — F3340 Major depressive disorder, recurrent, in remission, unspecified: Secondary | ICD-10-CM | POA: Diagnosis not present

## 2021-09-12 DIAGNOSIS — F900 Attention-deficit hyperactivity disorder, predominantly inattentive type: Secondary | ICD-10-CM | POA: Diagnosis not present

## 2021-09-12 MED ORDER — ARIPIPRAZOLE 2 MG PO TABS
2.0000 mg | ORAL_TABLET | Freq: Every morning | ORAL | 0 refills | Status: DC
  Filled 2021-09-12: qty 90, 90d supply, fill #0

## 2021-09-12 MED ORDER — VENLAFAXINE HCL ER 75 MG PO CP24
ORAL_CAPSULE | ORAL | 1 refills | Status: DC
  Filled 2021-09-12: qty 60, 30d supply, fill #0
  Filled 2021-10-24: qty 60, 30d supply, fill #1

## 2021-09-13 ENCOUNTER — Other Ambulatory Visit (HOSPITAL_COMMUNITY): Payer: Self-pay

## 2021-09-14 ENCOUNTER — Other Ambulatory Visit (HOSPITAL_COMMUNITY): Payer: Self-pay

## 2021-09-14 MED ORDER — DOXEPIN HCL 10 MG PO CAPS
10.0000 mg | ORAL_CAPSULE | Freq: Every day | ORAL | 2 refills | Status: DC
  Filled 2021-09-14: qty 30, 30d supply, fill #0

## 2021-09-14 MED ORDER — AMPHETAMINE-DEXTROAMPHET ER 20 MG PO CP24
40.0000 mg | ORAL_CAPSULE | Freq: Every day | ORAL | 0 refills | Status: DC
Start: 2021-10-09 — End: 2021-11-12
  Filled 2021-10-15: qty 60, 30d supply, fill #0

## 2021-09-17 ENCOUNTER — Other Ambulatory Visit (HOSPITAL_COMMUNITY): Payer: Self-pay

## 2021-09-27 ENCOUNTER — Other Ambulatory Visit: Payer: Self-pay | Admitting: Nurse Practitioner

## 2021-09-27 ENCOUNTER — Telehealth: Payer: Self-pay | Admitting: Family Medicine

## 2021-09-27 DIAGNOSIS — Z13220 Encounter for screening for lipoid disorders: Secondary | ICD-10-CM

## 2021-09-27 DIAGNOSIS — D72819 Decreased white blood cell count, unspecified: Secondary | ICD-10-CM

## 2021-09-27 NOTE — Telephone Encounter (Signed)
Patient has physical on 7/17 and needing labs done

## 2021-09-27 NOTE — Telephone Encounter (Signed)
Pt contacted and verbalized understanding.  

## 2021-09-27 NOTE — Telephone Encounter (Signed)
Pt has not had any labs with [place]. Please advise. Thank you

## 2021-10-08 ENCOUNTER — Other Ambulatory Visit (HOSPITAL_COMMUNITY): Payer: Self-pay

## 2021-10-15 ENCOUNTER — Other Ambulatory Visit (HOSPITAL_COMMUNITY): Payer: Self-pay

## 2021-10-15 ENCOUNTER — Ambulatory Visit (INDEPENDENT_AMBULATORY_CARE_PROVIDER_SITE_OTHER): Payer: 59 | Admitting: Nurse Practitioner

## 2021-10-15 ENCOUNTER — Encounter: Payer: Self-pay | Admitting: Nurse Practitioner

## 2021-10-15 VITALS — BP 124/74 | HR 86 | Temp 97.7°F | Ht 64.0 in | Wt 139.6 lb

## 2021-10-15 DIAGNOSIS — Z0000 Encounter for general adult medical examination without abnormal findings: Secondary | ICD-10-CM | POA: Diagnosis not present

## 2021-10-15 DIAGNOSIS — Z1159 Encounter for screening for other viral diseases: Secondary | ICD-10-CM

## 2021-10-15 DIAGNOSIS — D72819 Decreased white blood cell count, unspecified: Secondary | ICD-10-CM | POA: Diagnosis not present

## 2021-10-15 DIAGNOSIS — Z13220 Encounter for screening for lipoid disorders: Secondary | ICD-10-CM | POA: Diagnosis not present

## 2021-10-15 DIAGNOSIS — Z114 Encounter for screening for human immunodeficiency virus [HIV]: Secondary | ICD-10-CM

## 2021-10-15 NOTE — Progress Notes (Signed)
   Subjective:    Patient ID: Kathleen Key, female    DOB: 1965-11-28, 56 y.o.   MRN: [id number]  HPI  The patient comes in today for a wellness visit.  A review of their health history was completed.  A review of medications was also completed.  Any needed refills; None  Eating habits: good  Falls/  MVA accidents in past few months: No  Regular exercise: 2-3 times per week  Specialist pt sees on regular basis: Orthopedics, GYN, psychiatry, optometry.  Preventative health issues were discussed.   Additional concerns: none   Review of Systems     Objective:   Physical Exam Vitals reviewed.  Constitutional:      General: She is not in acute distress.    Appearance: Normal appearance. She is normal weight. She is not ill-appearing, toxic-appearing or diaphoretic.  HENT:     Head: Normocephalic and atraumatic.  Cardiovascular:     Rate and Rhythm: Normal rate and regular rhythm.     Pulses: Normal pulses.     Heart sounds: Normal heart sounds. No murmur heard. Pulmonary:     Effort: Pulmonary effort is normal. No respiratory distress.     Breath sounds: Normal breath sounds. No wheezing.  Musculoskeletal:     Comments: Grossly intact  Skin:    General: Skin is warm.     Capillary Refill: Capillary refill takes less than 2 seconds.  Neurological:     Mental Status: She is alert.     Comments: Grossly intact  Psychiatric:        Mood and Affect: Mood normal.        Behavior: Behavior normal.           Assessment & Plan:   1. Wellness examination Adult wellness-complete.wellness physical was conducted today. Importance of diet and exercise were discussed in detail.  Importance of stress reduction and healthy living were discussed.  In addition to this a discussion regarding safety was also covered.  We also reviewed over immunizations and gave recommendations regarding current immunization needed for age.   In addition to this additional areas were also  touched on including: Preventative health exams needed:  Patient states that she has gotten her colonoscopy, bone density scan, and Shingrix vaccines from [place].  Patient encouraged to have records sent to clinic so they can be uploaded in her chart. Per patient: Colonoscopy completed in 2020 Bone density completed 2020 PAP not indicated: Complete hysterectomy HIV and Hep C completed  Patient was advised yearly wellness exam  Patient to get lipid panel, CBC, CMP with lab work today.   2. Encounter for screening for HIV - HIV antibody (with reflex)  3. Encounter for hepatitis C screening test for low risk patient - Hepatitis C Antibody    Note:  This document was prepared using Dragon voice recognition software and may include unintentional dictation errors. Note - This record has been created using [employer].  Chart creation errors have been sought, but may not always  have been located. Such creation errors do not reflect on  the standard of medical care.

## 2021-10-16 ENCOUNTER — Other Ambulatory Visit (HOSPITAL_COMMUNITY): Payer: Self-pay

## 2021-10-16 LAB — LIPID PANEL
Chol/HDL Ratio: 2.3 ratio (ref 0.0–4.4)
Cholesterol, Total: 184 mg/dL (ref 100–199)
HDL: 79 mg/dL (ref >39)
LDL Chol Calc (NIH): 94 mg/dL (ref 0–99)
Triglycerides: 57 mg/dL (ref 0–149)
VLDL Cholesterol Cal: 11 mg/dL (ref 5–40)

## 2021-10-16 LAB — CBC WITH DIFFERENTIAL/PLATELET
Basophils Absolute: 0.1 10*3/uL (ref 0.0–0.2)
Basos: 2 %
EOS (ABSOLUTE): 0 10*3/uL (ref 0.0–0.4)
Eos: 1 %
Hematocrit: 41.2 % (ref 34.0–46.6)
Hemoglobin: 13.7 g/dL (ref 11.1–15.9)
Immature Grans (Abs): 0 10*3/uL (ref 0.0–0.1)
Immature Granulocytes: 0 %
Lymphocytes Absolute: 1.2 10*3/uL (ref 0.7–3.1)
Lymphs: 25 %
MCH: 31.9 pg (ref 26.6–33.0)
MCHC: 33.3 g/dL (ref 31.5–35.7)
MCV: 96 fL (ref 79–97)
Monocytes Absolute: 0.4 10*3/uL (ref 0.1–0.9)
Monocytes: 8 %
Neutrophils Absolute: 3.3 10*3/uL (ref 1.4–7.0)
Neutrophils: 64 %
Platelets: 318 10*3/uL (ref 150–450)
RBC: 4.3 x10E6/uL (ref 3.77–5.28)
RDW: 12.4 % (ref 11.7–15.4)
WBC: 5 10*3/uL (ref 3.4–10.8)

## 2021-10-16 LAB — CMP14+EGFR
ALT: 17 IU/L (ref 0–32)
AST: 17 IU/L (ref 0–40)
Albumin/Globulin Ratio: 1.8 (ref 1.2–2.2)
Albumin: 4.1 g/dL (ref 3.8–4.9)
Alkaline Phosphatase: 58 IU/L (ref 44–121)
BUN/Creatinine Ratio: 22 (ref 9–23)
BUN: 13 mg/dL (ref 6–24)
Bilirubin Total: 0.2 mg/dL (ref 0.0–1.2)
CO2: 24 mmol/L (ref 20–29)
Calcium: 8.7 mg/dL (ref 8.7–10.2)
Chloride: 102 mmol/L (ref 96–106)
Creatinine, Ser: 0.58 mg/dL (ref 0.57–1.00)
Globulin, Total: 2.3 g/dL (ref 1.5–4.5)
Glucose: 100 mg/dL — ABNORMAL HIGH (ref 70–99)
Potassium: 4.1 mmol/L (ref 3.5–5.2)
Sodium: 142 mmol/L (ref 134–144)
Total Protein: 6.4 g/dL (ref 6.0–8.5)
eGFR: 107 mL/min/{1.73_m2} (ref >59)

## 2021-10-16 LAB — HIV ANTIBODY (ROUTINE TESTING W REFLEX): HIV Screen 4th Generation wRfx: NONREACTIVE

## 2021-10-16 LAB — HEPATITIS C ANTIBODY: Hep C Virus Ab: NONREACTIVE

## 2021-10-23 DIAGNOSIS — Z5181 Encounter for therapeutic drug level monitoring: Secondary | ICD-10-CM | POA: Diagnosis not present

## 2021-10-23 DIAGNOSIS — F1520 Other stimulant dependence, uncomplicated: Secondary | ICD-10-CM | POA: Diagnosis not present

## 2021-10-23 DIAGNOSIS — Z79899 Other long term (current) drug therapy: Secondary | ICD-10-CM | POA: Diagnosis not present

## 2021-10-24 ENCOUNTER — Other Ambulatory Visit (HOSPITAL_COMMUNITY): Payer: Self-pay

## 2021-10-24 DIAGNOSIS — F3340 Major depressive disorder, recurrent, in remission, unspecified: Secondary | ICD-10-CM | POA: Diagnosis not present

## 2021-10-24 DIAGNOSIS — F900 Attention-deficit hyperactivity disorder, predominantly inattentive type: Secondary | ICD-10-CM | POA: Diagnosis not present

## 2021-10-24 MED ORDER — PRAZOSIN HCL 1 MG PO CAPS
ORAL_CAPSULE | ORAL | 1 refills | Status: DC
  Filled 2021-10-24: qty 60, 30d supply, fill #0
  Filled 2021-12-07: qty 60, 7d supply, fill #1

## 2021-10-25 ENCOUNTER — Other Ambulatory Visit (HOSPITAL_COMMUNITY): Payer: Self-pay

## 2021-10-29 ENCOUNTER — Telehealth: Payer: Self-pay | Admitting: Orthopedic Surgery

## 2021-10-29 NOTE — Telephone Encounter (Signed)
Per voice message - called back to patient - will speak w/us tomorrow 10/30/21 as I was unable to compete scheduling w/Dr Aline Brochure at this time - requests injections.

## 2021-10-30 ENCOUNTER — Other Ambulatory Visit: Payer: Self-pay | Admitting: Obstetrics & Gynecology

## 2021-10-30 DIAGNOSIS — R921 Mammographic calcification found on diagnostic imaging of breast: Secondary | ICD-10-CM

## 2021-11-01 NOTE — Telephone Encounter (Signed)
done

## 2021-11-09 ENCOUNTER — Other Ambulatory Visit (HOSPITAL_COMMUNITY): Payer: Self-pay

## 2021-11-12 ENCOUNTER — Ambulatory Visit (INDEPENDENT_AMBULATORY_CARE_PROVIDER_SITE_OTHER): Payer: 59 | Admitting: Orthopedic Surgery

## 2021-11-12 ENCOUNTER — Other Ambulatory Visit (HOSPITAL_COMMUNITY): Payer: Self-pay

## 2021-11-12 DIAGNOSIS — G8929 Other chronic pain: Secondary | ICD-10-CM | POA: Diagnosis not present

## 2021-11-12 DIAGNOSIS — S83282D Other tear of lateral meniscus, current injury, left knee, subsequent encounter: Secondary | ICD-10-CM

## 2021-11-12 DIAGNOSIS — M25562 Pain in left knee: Secondary | ICD-10-CM

## 2021-11-12 MED ORDER — METHYLPREDNISOLONE ACETATE 40 MG/ML IJ SUSP
40.0000 mg | Freq: Once | INTRAMUSCULAR | Status: AC
  Administered 2021-11-12: 40 mg via INTRA_ARTICULAR

## 2021-11-12 MED ORDER — AMPHETAMINE-DEXTROAMPHET ER 20 MG PO CP24
40.0000 mg | ORAL_CAPSULE | Freq: Every day | ORAL | 0 refills | Status: DC
  Filled 2021-11-12 – 2021-11-21 (×2): qty 60, 30d supply, fill #0

## 2021-11-12 NOTE — Addendum Note (Signed)
Addended by: Moreen Fowler R on: 11/12/2021 04:41 PM   Modules accepted: Orders

## 2021-11-12 NOTE — Progress Notes (Signed)
Chief Complaint  Patient presents with   Knee Pain    Left, request injection    Recurrent pain left knee patient has already had MRI  Declined surgery last time did well with injection would like another  Pain still in the lateral side of the knee  No effusion no sign of infection or swelling tenderness lateral joint line pain with McMurray's maneuver  Procedure note left knee injection   verbal consent was obtained to inject left knee joint  Timeout was completed to confirm the site of injection  The medications used were depomedrol 40 mg and 1% lidocaine 3 cc Anesthesia was provided by ethyl chloride and the skin was prepped with alcohol.  After cleaning the skin with alcohol a 20-gauge needle was used to inject the left knee joint. There were no complications. A sterile bandage was applied.

## 2021-11-12 NOTE — Patient Instructions (Signed)

## 2021-11-21 ENCOUNTER — Other Ambulatory Visit (HOSPITAL_COMMUNITY): Payer: Self-pay

## 2021-11-22 ENCOUNTER — Other Ambulatory Visit (HOSPITAL_COMMUNITY): Payer: Self-pay

## 2021-11-23 ENCOUNTER — Other Ambulatory Visit (HOSPITAL_COMMUNITY): Payer: Self-pay

## 2021-11-27 ENCOUNTER — Other Ambulatory Visit (HOSPITAL_COMMUNITY): Payer: Self-pay

## 2021-11-27 MED ORDER — VENLAFAXINE HCL ER 150 MG PO CP24
150.0000 mg | ORAL_CAPSULE | Freq: Every day | ORAL | 0 refills | Status: DC
  Filled 2021-11-27: qty 30, 30d supply, fill #0

## 2021-11-30 ENCOUNTER — Ambulatory Visit
Admission: RE | Admit: 2021-11-30 | Discharge: 2021-11-30 | Disposition: A | Discharge status: Home / Self Care | Payer: 59 | Source: Ambulatory Visit | Attending: Obstetrics & Gynecology | Admitting: Obstetrics & Gynecology

## 2021-11-30 DIAGNOSIS — R921 Mammographic calcification found on diagnostic imaging of breast: Secondary | ICD-10-CM | POA: Diagnosis not present

## 2021-12-07 ENCOUNTER — Other Ambulatory Visit (HOSPITAL_COMMUNITY): Payer: Self-pay

## 2021-12-13 ENCOUNTER — Other Ambulatory Visit (HOSPITAL_COMMUNITY): Payer: Self-pay

## 2021-12-13 MED ORDER — AMPHETAMINE-DEXTROAMPHET ER 20 MG PO CP24
20.0000 mg | ORAL_CAPSULE | Freq: Every morning | ORAL | 0 refills | Status: DC
  Filled 2021-12-13 – 2021-12-31 (×2): qty 30, 30d supply, fill #0

## 2021-12-13 MED ORDER — VENLAFAXINE HCL ER 150 MG PO CP24
150.0000 mg | ORAL_CAPSULE | Freq: Every day | ORAL | 0 refills | Status: DC
  Filled 2021-12-25: qty 90, 90d supply, fill #0

## 2021-12-13 MED ORDER — ARIPIPRAZOLE 2 MG PO TABS
2.0000 mg | ORAL_TABLET | Freq: Every morning | ORAL | 0 refills | Status: DC
  Filled 2021-12-13: qty 90, 90d supply, fill #0

## 2021-12-13 MED ORDER — PRAZOSIN HCL 1 MG PO CAPS
ORAL_CAPSULE | ORAL | 0 refills | Status: DC
  Filled 2021-12-13: qty 60, 30d supply, fill #0

## 2021-12-20 ENCOUNTER — Other Ambulatory Visit (HOSPITAL_COMMUNITY): Payer: Self-pay

## 2021-12-21 ENCOUNTER — Other Ambulatory Visit (HOSPITAL_COMMUNITY): Payer: Self-pay

## 2021-12-24 ENCOUNTER — Other Ambulatory Visit (HOSPITAL_COMMUNITY): Payer: Self-pay

## 2021-12-25 ENCOUNTER — Other Ambulatory Visit (HOSPITAL_COMMUNITY): Payer: Self-pay

## 2021-12-31 ENCOUNTER — Other Ambulatory Visit (HOSPITAL_COMMUNITY): Payer: Self-pay

## 2022-01-10 ENCOUNTER — Other Ambulatory Visit (HOSPITAL_COMMUNITY): Payer: Self-pay

## 2022-01-10 DIAGNOSIS — F900 Attention-deficit hyperactivity disorder, predominantly inattentive type: Secondary | ICD-10-CM | POA: Diagnosis not present

## 2022-01-10 DIAGNOSIS — F3340 Major depressive disorder, recurrent, in remission, unspecified: Secondary | ICD-10-CM | POA: Diagnosis not present

## 2022-01-10 MED ORDER — AMPHETAMINE-DEXTROAMPHET ER 20 MG PO CP24
40.0000 mg | ORAL_CAPSULE | Freq: Every day | ORAL | 0 refills | Status: AC
  Filled 2022-01-17 – 2022-01-18 (×2): qty 60, 30d supply, fill #0

## 2022-01-15 ENCOUNTER — Other Ambulatory Visit (HOSPITAL_COMMUNITY): Payer: Self-pay

## 2022-01-15 MED ORDER — AMPHETAMINE-DEXTROAMPHET ER 20 MG PO CP24: 40.0000 mg | ORAL_CAPSULE | Freq: Every day | ORAL | 0 refills | Status: AC

## 2022-01-15 MED ORDER — ARIPIPRAZOLE 2 MG PO TABS
2.0000 mg | ORAL_TABLET | Freq: Every morning | ORAL | 0 refills | Status: AC
  Filled 2022-01-15: qty 90, 90d supply, fill #0

## 2022-01-15 MED ORDER — PRAZOSIN HCL 2 MG PO CAPS
4.0000 mg | ORAL_CAPSULE | Freq: Every day | ORAL | 0 refills | Status: DC
  Filled 2022-01-15: qty 83, 42d supply, fill #0
  Filled 2022-01-15: qty 97, 48d supply, fill #0

## 2022-01-15 MED ORDER — VENLAFAXINE HCL ER 150 MG PO CP24
150.0000 mg | ORAL_CAPSULE | Freq: Every day | ORAL | 0 refills | Status: AC
  Filled 2022-01-15: qty 90, 90d supply, fill #0

## 2022-01-15 MED ORDER — AMPHETAMINE-DEXTROAMPHET ER 20 MG PO CP24
40.0000 mg | ORAL_CAPSULE | Freq: Every day | ORAL | 0 refills | Status: AC
  Filled 2022-02-19: qty 60, 30d supply, fill #0

## 2022-01-17 ENCOUNTER — Other Ambulatory Visit (HOSPITAL_COMMUNITY): Payer: Self-pay

## 2022-01-18 ENCOUNTER — Other Ambulatory Visit (HOSPITAL_COMMUNITY): Payer: Self-pay

## 2022-02-19 ENCOUNTER — Other Ambulatory Visit (HOSPITAL_COMMUNITY): Payer: Self-pay

## 2022-02-26 ENCOUNTER — Ambulatory Visit: Payer: 59 | Admitting: Family Medicine

## 2022-02-26 ENCOUNTER — Other Ambulatory Visit (HOSPITAL_COMMUNITY): Payer: Self-pay

## 2022-02-26 VITALS — BP 133/74 | Temp 97.6°F | Ht 64.0 in | Wt 142.0 lb

## 2022-02-26 DIAGNOSIS — J029 Acute pharyngitis, unspecified: Secondary | ICD-10-CM | POA: Diagnosis not present

## 2022-02-26 DIAGNOSIS — J988 Other specified respiratory disorders: Secondary | ICD-10-CM

## 2022-02-26 DIAGNOSIS — B9789 Other viral agents as the cause of diseases classified elsewhere: Secondary | ICD-10-CM | POA: Diagnosis not present

## 2022-02-26 LAB — POCT RAPID STREP A (OFFICE): Rapid Strep A Screen: NEGATIVE

## 2022-02-26 MED ORDER — PREDNISONE 50 MG PO TABS
50.0000 mg | ORAL_TABLET | Freq: Every day | ORAL | 0 refills | Status: AC
  Filled 2022-02-26: qty 5, 5d supply, fill #0

## 2022-02-26 MED ORDER — PROMETHAZINE-DM 6.25-15 MG/5ML PO SYRP
5.0000 mL | ORAL_SOLUTION | Freq: Four times a day (QID) | ORAL | 0 refills | Status: DC | PRN
  Filled 2022-02-26: qty 118, 6d supply, fill #0

## 2022-02-26 NOTE — Assessment & Plan Note (Signed)
Rapid strep negative.  Clinical picture consistent with viral respiratory infection.  Given history of sarcoidosis I am placing her on a brief burst of prednisone.  Promethazine DM for cough.

## 2022-02-26 NOTE — Patient Instructions (Signed)
Strep negative.  This is viral.  Rest, lots of fluids.  Medications as prescribed.  Take care  Dr. Lacinda Axon

## 2022-02-26 NOTE — Progress Notes (Signed)
Subjective:  Patient ID: Kathleen Key, female    DOB: September 10, 1965  Age: 56 y.o. MRN: [id number]  CC: Chief Complaint  Patient presents with   Cough    Congestion and scratchy throat- losing voice since last Friday    HPI:  56 year old female with a history of sarcoidosis presents with respiratory symptoms.  Patient reports that her symptoms started on Friday.  She reports sore throat, runny nose, watery eyes, cough, congestion.  No fever.  She has had a recent sick contact.  No relieving factors.  Most troublesome symptom is a sore throat.  She is also had voice change.  No other complaints or concerns at this time.  Patient Active Problem List   Diagnosis Date Noted   Viral respiratory infection 02/26/2022   Gastroesophageal reflux disease 02/15/2021   Irritable bowel syndrome 02/15/2021   Mild intermittent asthma 02/15/2021   Major depressive disorder, recurrent episode, mild ([place]) 05/26/2018   GAD (generalized anxiety disorder) 04/29/2018   ADD (attention deficit disorder) without hyperactivity 04/29/2018   Sarcoid ([place]) 12/09/2012    Social Hx   Social History   Socioeconomic History   Marital status: Married    Spouse name: Not on file   Number of children: Not on file   Years of education: Not on file   Highest education level: Not on file  Occupational History   Not on file  Tobacco Use   Smoking status: Never   Smokeless tobacco: Never  Vaping Use   Vaping Use: Never used  Substance and Sexual Activity   Alcohol use: Not Currently    Comment: occassional   Drug use: Never   Sexual activity: Yes    Birth control/protection: Surgical    Comment: hysterectomy  Other Topics Concern   Not on file  Social History Narrative   Not on file   Social Determinants of Health   Financial Resource Strain: Medium Risk (02/08/2021)   Overall Financial Resource Strain (CARDIA)    Difficulty of Paying Living Expenses: Somewhat hard  Food Insecurity: No Food  Insecurity (02/08/2021)   Hunger Vital Sign    Worried About Running Out of Food in the Last Year: Never true    Ran Out of Food in the Last Year: Never true  Transportation Needs: No Transportation Needs (02/08/2021)   PRAPARE - Hydrologist (Medical): No    Lack of Transportation (Non-Medical): No  Physical Activity: Inactive (02/08/2021)   Exercise Vital Sign    Days of Exercise per Week: 0 days    Minutes of Exercise per Session: 0 min  Stress: Stress Concern Present (02/08/2021)   [place]    Feeling of Stress : To some extent  Social Connections: Moderately Integrated (02/08/2021)   Social Connection and Isolation Panel [NHANES]    Frequency of Communication with Friends and Family: More than three times a week    Frequency of Social Gatherings with Friends and Family: Twice a week    Attends Religious Services: More than 4 times per year    Active Member of [employer] or Organizations: No    Attends Music therapist: Never    Marital Status: Married    Review of Systems Per HPI  Objective:  BP 133/74   Temp 97.6 F (36.4 C) (Oral)   Ht '5\' 4"'$  (1.626 m)   Wt 142 lb (64.4 kg)   LMP 10/08/2012   BMI 24.37 kg/m  02/26/2022   11:33 AM 10/15/2021    9:06 AM 07/02/2021    4:11 PM  BP/Weight  Systolic BP 702 637 858  Diastolic BP 74 74 75  Wt. (Lbs) 142 139.6 137  BMI 24.37 kg/m2 23.96 kg/m2 23.52 kg/m2    Physical Exam Vitals and nursing note reviewed.  Constitutional:      General: She is not in acute distress.    Appearance: She is not ill-appearing.  HENT:     Head: Normocephalic and atraumatic.     Right Ear: Tympanic membrane normal.     Left Ear: Tympanic membrane normal.     Mouth/Throat:     Pharynx: Posterior oropharyngeal erythema present. No oropharyngeal exudate.  Eyes:     General:        Right eye: No discharge.        Left eye: No  discharge.     Conjunctiva/sclera: Conjunctivae normal.  Cardiovascular:     Rate and Rhythm: Normal rate and regular rhythm.  Pulmonary:     Effort: Pulmonary effort is normal.     Breath sounds: Normal breath sounds. No wheezing or rales.  Neurological:     Mental Status: She is alert.     Lab Results  Component Value Date   WBC 5.0 10/15/2021   HGB 13.7 10/15/2021   HCT 41.2 10/15/2021   PLT 318 10/15/2021   GLUCOSE 100 (H) 10/15/2021   CHOL 184 10/15/2021   TRIG 57 10/15/2021   HDL 79 10/15/2021   LDLCALC 94 10/15/2021   ALT 17 10/15/2021   AST 17 10/15/2021   NA 142 10/15/2021   K 4.1 10/15/2021   CL 102 10/15/2021   CREATININE 0.58 10/15/2021   BUN 13 10/15/2021   CO2 24 10/15/2021   TSH 2.368 03/07/2019   HGBA1C 5.6 03/07/2019     Assessment & Plan:   Problem List Items Addressed This Visit       Respiratory   Viral respiratory infection - Primary    Rapid strep negative.  Clinical picture consistent with viral respiratory infection.  Given history of sarcoidosis I am placing her on a brief burst of prednisone.  Promethazine DM for cough.      Other Visit Diagnoses     Sore throat       Relevant Orders   POCT rapid strep A (Completed)       Meds ordered this encounter  Medications   predniSONE (DELTASONE) 50 MG tablet    Sig: Take 1 tablet (50 mg total) by mouth daily for 5 days.    Dispense:  5 tablet    Refill:  0   promethazine-dextromethorphan (PROMETHAZINE-DM) 6.25-15 MG/5ML syrup    Sig: Take 5 mLs by mouth 4 (four) times daily as needed.    Dispense:  118 mL    Refill:  0    Follow-up:  Return if symptoms worsen or fail to improve.  [place]

## 2022-03-26 ENCOUNTER — Other Ambulatory Visit (HOSPITAL_COMMUNITY): Payer: Self-pay

## 2022-05-03 ENCOUNTER — Ambulatory Visit (INDEPENDENT_AMBULATORY_CARE_PROVIDER_SITE_OTHER): Payer: Self-pay | Admitting: Family Medicine

## 2022-05-03 ENCOUNTER — Encounter: Payer: Self-pay | Admitting: Family Medicine

## 2022-05-03 VITALS — BP 125/79 | HR 88 | Ht 64.0 in | Wt 145.0 lb

## 2022-05-03 DIAGNOSIS — R0781 Pleurodynia: Secondary | ICD-10-CM

## 2022-05-03 MED ORDER — MELOXICAM 15 MG PO TABS: 15.0000 mg | ORAL_TABLET | Freq: Every day | ORAL | 0 refills | Status: DC | PRN

## 2022-05-03 NOTE — Patient Instructions (Signed)
Medication as directed.  If continues to persist and does not improve, please let us know.  Take care  Dr. Lacinda Axon

## 2022-05-04 DIAGNOSIS — R0781 Pleurodynia: Secondary | ICD-10-CM | POA: Insufficient documentation

## 2022-05-04 NOTE — Assessment & Plan Note (Signed)
MSK in nature.  Meloxicam as directed.

## 2022-05-04 NOTE — Progress Notes (Signed)
Subjective:  Patient ID: Kathleen Key, female    DOB: [birth year]  Age: 57 y.o. MRN: [id number]  CC: Chief Complaint  Patient presents with   left side rib cage pain    Pain with sneezing or moving X 2 weeks worse if takes a inhales deeply no recent injury or fall    HPI:  57 year old female presents for evaluation of the above.  Patient reports left-sided rib pain for the past 2 weeks.  She states that pain typically occurs when she moves in certain ways, sneezes, or takes a deep breath.  No known injury.  The only thing that she can think of that may have caused her discomfort is getting in and out of her husband's truck.  No recent fall or trauma.  No cough or shortness of breath.  No fever.  No other complaints.  Patient Active Problem List   Diagnosis Date Noted   Rib pain on left side 05/04/2022   Gastroesophageal reflux disease 02/15/2021   Irritable bowel syndrome 02/15/2021   Mild intermittent asthma 02/15/2021   Major depressive disorder, recurrent episode, mild ([place]) 05/26/2018   GAD (generalized anxiety disorder) 04/29/2018   ADD (attention deficit disorder) without hyperactivity 04/29/2018   Sarcoid ([place]) 12/09/2012    Social Hx   Social History   Socioeconomic History   Marital status: Married    Spouse name: Not on file   Number of children: Not on file   Years of education: Not on file   Highest education level: Not on file  Occupational History   Not on file  Tobacco Use   Smoking status: Never   Smokeless tobacco: Never  Vaping Use   Vaping Use: Never used  Substance and Sexual Activity   Alcohol use: Not Currently    Comment: occassional   Drug use: Never   Sexual activity: Yes    Birth control/protection: Surgical    Comment: hysterectomy  Other Topics Concern   Not on file  Social History Narrative   Not on file   Social Determinants of Health   Financial Resource Strain: Medium Risk (02/08/2021)   Overall Financial Resource Strain  (CARDIA)    Difficulty of Paying Living Expenses: Somewhat hard  Food Insecurity: No Food Insecurity (02/08/2021)   Hunger Vital Sign    Worried About Running Out of Food in the Last Year: Never true    Ran Out of Food in the Last Year: Never true  Transportation Needs: No Transportation Needs (02/08/2021)   PRAPARE - Hydrologist (Medical): No    Lack of Transportation (Non-Medical): No  Physical Activity: Inactive (02/08/2021)   Exercise Vital Sign    Days of Exercise per Week: 0 days    Minutes of Exercise per Session: 0 min  Stress: Stress Concern Present (02/08/2021)   [place]    Feeling of Stress : To some extent  Social Connections: Moderately Integrated (02/08/2021)   Social Connection and Isolation Panel [NHANES]    Frequency of Communication with Friends and Family: More than three times a week    Frequency of Social Gatherings with Friends and Family: Twice a week    Attends Religious Services: More than 4 times per year    Active Member of [employer] or Organizations: No    Attends Archivist Meetings: Never    Marital Status: Married    Review of Systems Per HPI  Objective:  BP  125/79   Pulse 88   Ht '5\' 4"'$  (1.626 m)   Wt 145 lb (65.8 kg)   LMP 10/08/2012   SpO2 100%   BMI 24.89 kg/m      05/03/2022   11:08 AM 02/26/2022   11:33 AM 10/15/2021    9:06 AM  BP/Weight  Systolic BP 030 092 330  Diastolic BP 79 74 74  Wt. (Lbs) 145 142 139.6  BMI 24.89 kg/m2 24.37 kg/m2 23.96 kg/m2    Physical Exam Vitals and nursing note reviewed.  Constitutional:      General: She is not in acute distress.    Appearance: Normal appearance.  HENT:     Head: Normocephalic and atraumatic.  Cardiovascular:     Rate and Rhythm: Normal rate and regular rhythm.  Pulmonary:     Effort: Pulmonary effort is normal.     Breath sounds: Normal breath sounds. No wheezing, rhonchi  or rales.  Neurological:     Mental Status: She is alert.  Psychiatric:        Mood and Affect: Mood normal.        Behavior: Behavior normal.     Lab Results  Component Value Date   WBC 5.0 10/15/2021   HGB 13.7 10/15/2021   HCT 41.2 10/15/2021   PLT 318 10/15/2021   GLUCOSE 100 (H) 10/15/2021   CHOL 184 10/15/2021   TRIG 57 10/15/2021   HDL 79 10/15/2021   LDLCALC 94 10/15/2021   ALT 17 10/15/2021   AST 17 10/15/2021   NA 142 10/15/2021   K 4.1 10/15/2021   CL 102 10/15/2021   CREATININE 0.58 10/15/2021   BUN 13 10/15/2021   CO2 24 10/15/2021   TSH 2.368 03/07/2019   HGBA1C 5.6 03/07/2019     Assessment & Plan:   Problem List Items Addressed This Visit       Other   Rib pain on left side - Primary    MSK in nature.  Meloxicam as directed.       Meds ordered this encounter  Medications   meloxicam (MOBIC) 15 MG tablet    Sig: Take 1 tablet (15 mg total) by mouth daily as needed for pain.    Dispense:  14 tablet    Refill:  0    Follow-up:  Return if symptoms worsen or fail to improve.  [place]

## 2022-05-30 ENCOUNTER — Encounter: Payer: Self-pay | Admitting: Radiology

## 2022-07-08 ENCOUNTER — Other Ambulatory Visit: Payer: Self-pay

## 2022-07-08 DIAGNOSIS — N951 Menopausal and female climacteric states: Secondary | ICD-10-CM

## 2022-07-08 DIAGNOSIS — Z79890 Hormone replacement therapy (postmenopausal): Secondary | ICD-10-CM

## 2022-07-08 NOTE — Telephone Encounter (Signed)
Patient called and wanted her Progesterone refilled.  The refill should be sent to [facility] on [address]. [employer]

## 2022-07-09 MED ORDER — PROGESTERONE MICRONIZED 100 MG PO CAPS: 200.0000 mg | ORAL_CAPSULE | Freq: Every day | ORAL | 4 refills | Status: DC

## 2022-10-24 ENCOUNTER — Encounter: Payer: Self-pay | Admitting: Obstetrics & Gynecology

## 2022-10-24 ENCOUNTER — Ambulatory Visit (INDEPENDENT_AMBULATORY_CARE_PROVIDER_SITE_OTHER): Payer: PRIVATE HEALTH INSURANCE | Admitting: Obstetrics & Gynecology

## 2022-10-24 VITALS — BP 133/84 | HR 88 | Ht 64.0 in | Wt 137.8 lb

## 2022-10-24 DIAGNOSIS — N951 Menopausal and female climacteric states: Secondary | ICD-10-CM | POA: Diagnosis not present

## 2022-10-24 DIAGNOSIS — Z79890 Hormone replacement therapy (postmenopausal): Secondary | ICD-10-CM | POA: Diagnosis not present

## 2022-10-24 DIAGNOSIS — Z79899 Other long term (current) drug therapy: Secondary | ICD-10-CM | POA: Diagnosis not present

## 2022-10-24 MED ORDER — VEOZAH 45 MG PO TABS: 1.0000 | ORAL_TABLET | Freq: Every day | ORAL | 4 refills | Status: AC

## 2022-10-24 NOTE — Progress Notes (Signed)
GYN VISIT Patient name: Kathleen Key MRN [id number]  Date of birth: 01-19-1966 Chief Complaint:   Follow-up  History of Present Illness:   Kathleen Key is a 57 y.o. G0P0000 PM, PHfemale being seen today for following regarding:  HRT/vasomotor symptoms- last visit Feb 2023.  Plan was trial of herbal supplement along with current dose of HRT.  She notes no improvement.  She rates her symptoms 8-9/10 with multiple hot flashes daily.   Currently on Estrace 2mg  daily and prometrium 200mg  daily  Patient's last menstrual period was 10/08/2012.    Review of Systems:   Pertinent items are noted in HPI Denies fever/chills, dizziness, headaches, visual disturbances, fatigue, shortness of breath, chest pain, abdominal pain, vomiting. Pertinent History Reviewed:   Past Surgical History:  Procedure Laterality Date   ABDOMINAL HYSTERECTOMY N/A 10/13/2012   Procedure: HYSTERECTOMY ABDOMINAL;  Surgeon: Tilda Burrow, MD;  Location: [facility];  Service: Gynecology;  Laterality: N/A;   BILATERAL SALPINGECTOMY Bilateral 10/13/2012   Procedure: BILATERAL SALPINGECTOMY;  Surgeon: Tilda Burrow, MD;  Location: [facility];  Service: Gynecology;  Laterality: Bilateral;   BREAST EXCISIONAL BIOPSY Left 05/2015   BREAST LUMPECTOMY WITH RADIOACTIVE SEED LOCALIZATION Left 07/28/2015   Procedure: LEFT BREAST LUMPECTOMY WITH RADIOACTIVE SEED LOCALIZATION;  Surgeon: Chevis Pretty III, MD;  Location: [facility];  Service: General;  Laterality: Left;   LAPAROSCOPIC BILATERAL SALPINGO OOPHERECTOMY Bilateral 12/29/2019   Procedure: LAPAROSCOPIC BILATERAL OOPHORECTOMY WITH ENDOCATCH;  Surgeon: Candice Camp, MD;  Location: [facility];  Service: Gynecology;  Laterality: Bilateral;   ULNAR NERVE TRANSPOSITION Left 05/2019   WISDOM TOOTH EXTRACTION     Dr. Keturah Barre office-[facility], [place], [place]    Past Medical History:  Diagnosis Date   ADD (attention deficit disorder)    Asthma,  cough variant    pulmonology--- dr c. young   GAD (generalized anxiety disorder)    GERD (gastroesophageal reflux disease)    History of 2019 novel coronavirus disease (COVID-19) 01/05/2019   per pt positive results done at [facility] , Dr Kirby Funk,  per pt mild symptoms resolved in one week   History of urinary retention 08/2012   due to uterine retroverted s/p retroversion and hysterectomy   History of uterine fibroid    Irritable bowel syndrome with constipation    MDD (major depressive disorder)    Ovarian mass    Pelvic pain    Pulmonary sarcoidosis (HCC) 2014   followed by dr c. young-- dx 2014 lung nodules,  clinically inactive and stable   Shortness of breath dyspnea    Reviewed problem list, medications and allergies. Physical Assessment:   Vitals:   10/24/22 1614  BP: 133/84  Pulse: 88  Weight: 137 lb 12.8 oz (62.5 kg)  Height: 5\' 4"  (1.626 m)  Body mass index is 23.65 kg/m.       Physical Examination:   General appearance: alert, well appearing, and in no distress  Psych: mood appropriate, normal affect  Skin: warm & dry   Cardiovascular: normal heart rate noted  Respiratory: normal respiratory effort, no distress  Chaperone: N/A    Assessment & Plan:  1) Vasomotor symptoms/HRT -already on max dose of HRT -plan to start on veozah- no contraindications.  Last LFTs 09/2021, plan to recheck today then @ 3 and 6 mos -pt given 2 wks sample  Meds ordered this encounter  Medications   Fezolinetant (VEOZAH) 45 MG TABS    Sig: Take 1 tablet (45  mg total) by mouth daily.    Dispense:  90 tablet    Refill:  4      Orders Placed This Encounter  Procedures   Comp Met (CMET)    Return in about 3 months (around 01/24/2023) for Medication follow up.   Myna Hidalgo, DO Attending Obstetrician & Gynecologist, [facility] for [employer], [facility]

## 2022-10-25 LAB — COMPREHENSIVE METABOLIC PANEL
Albumin: 4.1 g/dL (ref 3.8–4.9)
Potassium: 4.2 mmol/L (ref 3.5–5.2)
eGFR: 103 mL/min/{1.73_m2} (ref >59)

## 2022-10-30 ENCOUNTER — Telehealth: Payer: Self-pay | Admitting: Obstetrics & Gynecology

## 2022-10-30 ENCOUNTER — Other Ambulatory Visit: Payer: Self-pay | Admitting: Obstetrics & Gynecology

## 2022-10-30 ENCOUNTER — Other Ambulatory Visit (HOSPITAL_COMMUNITY): Payer: Self-pay

## 2022-10-30 DIAGNOSIS — N951 Menopausal and female climacteric states: Secondary | ICD-10-CM

## 2022-10-30 DIAGNOSIS — Z79890 Hormone replacement therapy (postmenopausal): Secondary | ICD-10-CM

## 2022-10-30 MED ORDER — ESTRADIOL 2 MG PO TABS
4.0000 mg | ORAL_TABLET | Freq: Every day | ORAL | 3 refills | Status: DC
  Filled 2022-10-30: qty 180, 90d supply, fill #0

## 2022-10-30 NOTE — Telephone Encounter (Signed)
Patient called stating she couldn't start the trial meds due to high enzyme level. Would like to get back on what she was on for her hot flashes. Please advise.

## 2022-10-30 NOTE — Progress Notes (Signed)
Refill on estrogen therapy

## 2022-10-31 ENCOUNTER — Encounter: Payer: Self-pay | Admitting: Obstetrics & Gynecology

## 2022-10-31 ENCOUNTER — Other Ambulatory Visit (HOSPITAL_COMMUNITY): Payer: Self-pay

## 2022-11-01 ENCOUNTER — Other Ambulatory Visit (HOSPITAL_COMMUNITY): Payer: Self-pay

## 2022-11-20 ENCOUNTER — Ambulatory Visit: Payer: PRIVATE HEALTH INSURANCE | Admitting: Family Medicine

## 2022-11-22 ENCOUNTER — Encounter: Payer: Self-pay | Admitting: Family Medicine

## 2022-11-22 DIAGNOSIS — Z1231 Encounter for screening mammogram for malignant neoplasm of breast: Secondary | ICD-10-CM

## 2022-11-28 ENCOUNTER — Ambulatory Visit: Payer: PRIVATE HEALTH INSURANCE | Admitting: Family Medicine

## 2022-11-29 ENCOUNTER — Other Ambulatory Visit (HOSPITAL_COMMUNITY): Payer: Self-pay

## 2022-12-25 LAB — HM MAMMOGRAPHY

## 2023-01-21 ENCOUNTER — Encounter: Payer: Self-pay | Admitting: *Deleted

## 2023-03-20 ENCOUNTER — Encounter: Payer: Self-pay | Admitting: Obstetrics & Gynecology

## 2023-03-20 ENCOUNTER — Encounter (HOSPITAL_COMMUNITY): Payer: Self-pay

## 2023-03-20 ENCOUNTER — Ambulatory Visit: Payer: PRIVATE HEALTH INSURANCE | Admitting: Obstetrics & Gynecology

## 2023-03-20 ENCOUNTER — Other Ambulatory Visit (HOSPITAL_COMMUNITY): Payer: Self-pay

## 2023-03-20 VITALS — BP 135/82 | HR 72 | Ht 64.0 in | Wt 136.8 lb

## 2023-03-20 DIAGNOSIS — Z79890 Hormone replacement therapy (postmenopausal): Secondary | ICD-10-CM

## 2023-03-20 DIAGNOSIS — Z01419 Encounter for gynecological examination (general) (routine) without abnormal findings: Secondary | ICD-10-CM | POA: Diagnosis not present

## 2023-03-20 DIAGNOSIS — N941 Unspecified dyspareunia: Secondary | ICD-10-CM

## 2023-03-20 DIAGNOSIS — N951 Menopausal and female climacteric states: Secondary | ICD-10-CM

## 2023-03-20 DIAGNOSIS — Z124 Encounter for screening for malignant neoplasm of cervix: Secondary | ICD-10-CM

## 2023-03-20 MED ORDER — ESTRADIOL 2 MG PO TABS
4.0000 mg | ORAL_TABLET | Freq: Every day | ORAL | 3 refills | Status: DC
  Filled 2023-03-20 (×2): qty 180, 90d supply, fill #0

## 2023-03-20 MED ORDER — ESTRADIOL 0.1 MG/GM VA CREA
0.5000 g | TOPICAL_CREAM | VAGINAL | 6 refills | Status: DC
  Filled 2023-03-20 (×2): qty 42.5, 90d supply, fill #0

## 2023-03-20 MED ORDER — PROGESTERONE MICRONIZED 100 MG PO CAPS
200.0000 mg | ORAL_CAPSULE | Freq: Every day | ORAL | 4 refills | Status: DC
  Filled 2023-03-20: qty 180, 90d supply, fill #0

## 2023-03-20 NOTE — Progress Notes (Signed)
WELL-WOMAN EXAMINATION Patient name: Kathleen Key MRN [id number]  Date of birth: 09-04-1965 Chief Complaint:   Gynecologic Exam  History of Present Illness:   Kathleen Key is a 57 y.o. G0P0000 PM, PH female being seen today for a routine well-woman exam and the following concerns:  HRT:  Long standing use- did not start veozah and seems to be doing ok.  Rates her symptoms 6/10- medication seems to help- she has noted some improvement with hot flashes. Overall this seems to be about the same. She has noted some vaginal dryness/dyspareunia.  Using lubricant which used to work, but not any longer.  Denies vaginal discharge, itching or irritation.  Denies pelvic or abdominal pain. Reports no other acute complaints  Notes hemorrhoids- using cream and soaks.    Patient's last menstrual period was 10/08/2012.  The current method of family planning is status post hysterectomy.   Last pap NA.  Last mammogram: 12/2022. Last colonoscopy: [facility]- not sure when     02/08/2021    3:05 PM  Depression screen PHQ 2/9  Decreased Interest 0  Down, Depressed, Hopeless 1  PHQ - 2 Score 1  Altered sleeping 3  Tired, decreased energy 1  Change in appetite 0  Feeling bad or failure about yourself  0  Trouble concentrating 2  Moving slowly or fidgety/restless 0  Suicidal thoughts 0  PHQ-9 Score 7      Review of Systems:   Pertinent items are noted in HPI Denies any headaches, blurred vision, fatigue, shortness of breath, chest pain, abdominal pain, bowel movements, urination, or intercourse unless otherwise stated above.  Pertinent History Reviewed:  Reviewed past medical,surgical, social and family history.  Reviewed problem list, medications and allergies. Physical Assessment:   Vitals:   03/20/23 1140  BP: 135/82  Pulse: 72  Weight: 136 lb 12.8 oz (62.1 kg)  Height: 5\' 4"  (1.626 m)  Body mass index is 23.48 kg/m.        Physical Examination:   General appearance - well  appearing, and in no distress  Mental status - alert, oriented to person, place, and time  Psych:  She has a normal mood and affect  Skin - warm and dry, normal color, no suspicious lesions noted  Chest - effort normal, all lung fields clear to auscultation bilaterally  Heart - normal rate and regular rhythm  Neck:  midline trachea, no thyromegaly or nodules  Breasts - breasts appear normal, no suspicious masses, no skin or nipple changes or  axillary nodes  Abdomen - soft, nontender, nondistended, no masses or organomegaly  Pelvic - VULVA: normal appearing vulva with no masses, tenderness or lesions  VAGINA: normal appearing vagina with normal color and discharge, no lesions Uterus and cervix surgically absent. No abnormalities noted on exam.    ADNEXA: No adnexal masses or tenderness noted.  Extremities:  No swelling or varicosities noted  Chaperone:  pt declined      Assessment & Plan:  1) Well-Woman Exam Pap no longer indicated Mammogram and colonoscopy up to date  2) HRT, Dyspareunia -plan to continue with current meds -will add vaginal estrogen for dysparuenia   No orders of the defined types were placed in this encounter.   Meds:  Meds ordered this encounter  Medications   estradiol (ESTRACE) 2 MG tablet    Sig: Take 2 tablets (4 mg total) by mouth daily.    Dispense:  180 tablet    Refill:  3   progesterone (  PROMETRIUM) 100 MG capsule    Sig: Take 2 capsules (200 mg total) by mouth daily.    Dispense:  180 capsule    Refill:  4   estradiol (ESTRACE VAGINAL) 0.1 MG/GM vaginal cream    Sig: 0.5g (pea-sized amount) twice weekly    Dispense:  42.5 g    Refill:  6    Follow-up: Return in about 1 year (around 03/19/2024) for Annual.   Myna Hidalgo, DO Attending Obstetrician & Gynecologist, [facility], [facility]

## 2023-04-01 ENCOUNTER — Other Ambulatory Visit (HOSPITAL_COMMUNITY): Payer: Self-pay

## 2023-04-04 ENCOUNTER — Telehealth: Payer: Self-pay

## 2023-04-04 DIAGNOSIS — N941 Unspecified dyspareunia: Secondary | ICD-10-CM

## 2023-04-04 DIAGNOSIS — N951 Menopausal and female climacteric states: Secondary | ICD-10-CM

## 2023-04-04 DIAGNOSIS — Z79890 Hormone replacement therapy (postmenopausal): Secondary | ICD-10-CM

## 2023-04-04 MED ORDER — PROGESTERONE MICRONIZED 100 MG PO CAPS: 200.0000 mg | ORAL_CAPSULE | Freq: Every day | ORAL | 4 refills | Status: AC

## 2023-04-04 MED ORDER — ESTRADIOL 2 MG PO TABS: 4.0000 mg | ORAL_TABLET | Freq: Every day | ORAL | 3 refills | Status: DC

## 2023-04-04 MED ORDER — ESTRADIOL 0.1 MG/GM VA CREA: 0.5000 g | TOPICAL_CREAM | VAGINAL | 6 refills | Status: DC

## 2023-04-04 NOTE — Telephone Encounter (Signed)
 Meds ordered on 12/19 sent to wrong pharmacy. Pt unable to transfer. Resent per [facility] to [facility] per patient request.

## 2023-04-04 NOTE — Telephone Encounter (Signed)
 Patient called and stated that Dr. Charlotta Newton sent her in a rx for estradiol on 03/20/23 but, it was sent to the wrong pharmacy.  It should be [facility] to [facility] on [address].

## 2023-05-05 ENCOUNTER — Ambulatory Visit (INDEPENDENT_AMBULATORY_CARE_PROVIDER_SITE_OTHER): Payer: PRIVATE HEALTH INSURANCE | Admitting: Physician Assistant

## 2023-05-05 ENCOUNTER — Encounter: Payer: Self-pay | Admitting: Physician Assistant

## 2023-05-05 VITALS — BP 124/78 | HR 76 | Temp 98.6°F | Ht 64.0 in | Wt 141.0 lb

## 2023-05-05 DIAGNOSIS — J029 Acute pharyngitis, unspecified: Secondary | ICD-10-CM | POA: Diagnosis not present

## 2023-05-05 DIAGNOSIS — J069 Acute upper respiratory infection, unspecified: Secondary | ICD-10-CM | POA: Diagnosis not present

## 2023-05-05 LAB — POCT RAPID STREP A (OFFICE): Rapid Strep A Screen: NEGATIVE

## 2023-05-05 MED ORDER — PROMETHAZINE-DM 6.25-15 MG/5ML PO SYRP: 5.0000 mL | ORAL_SOLUTION | Freq: Four times a day (QID) | ORAL | 0 refills | Status: AC | PRN

## 2023-05-05 MED ORDER — PREDNISONE 20 MG PO TABS: 40.0000 mg | ORAL_TABLET | Freq: Every day | ORAL | 0 refills | Status: AC

## 2023-05-05 NOTE — Progress Notes (Cosign Needed)
Acute Office Visit  Subjective:     Patient ID: Kathleen Key, female    DOB: Oct 04, 1965, 58 y.o.   MRN: [id number]   Sore Throat  Associated symptoms include congestion, coughing and shortness of breath. Pertinent negatives include no abdominal pain, diarrhea, ear pain or headaches.   Patient is in today for sore throat. Patient states symptoms began Friday night and persisted over the weekend. Today she is complaining of body aches, cough, and congestion as well. She denies fevers or ear pain. She admits some shortness of breath. She has been taking cough syrup at home. Patient does have past medical history of sarcoidosis.   Review of Systems  Constitutional:  Positive for malaise/fatigue. Negative for chills and fever.  HENT:  Positive for congestion and sore throat. Negative for ear pain.   Respiratory:  Positive for cough and shortness of breath.   Cardiovascular:  Negative for chest pain and palpitations.  Gastrointestinal:  Negative for abdominal pain, constipation, diarrhea and nausea.  Musculoskeletal:  Positive for myalgias.  Neurological:  Negative for headaches.        Objective:     BP 124/78   Pulse 76   Temp 98.6 F (37 C)   Ht 5\' 4"  (1.626 m)   Wt 141 lb (64 kg)   LMP 10/08/2012   SpO2 100%   BMI 24.20 kg/m   Physical Exam Vitals reviewed.  Constitutional:      General: She is not in acute distress.    Appearance: Normal appearance.  HENT:     Right Ear: Tympanic membrane normal.     Left Ear: Tympanic membrane normal.     Nose: Congestion present.     Mouth/Throat:     Mouth: Mucous membranes are moist.     Pharynx: Oropharynx is clear. Posterior oropharyngeal erythema present.  Eyes:     Extraocular Movements: Extraocular movements intact.     Conjunctiva/sclera: Conjunctivae normal.  Cardiovascular:     Rate and Rhythm: Normal rate and regular rhythm.     Heart sounds: No murmur heard.    No friction rub. No gallop.  Pulmonary:      Effort: Pulmonary effort is normal.     Breath sounds: Normal breath sounds. No stridor. No wheezing, rhonchi or rales.  Musculoskeletal:        General: Normal range of motion.  Skin:    General: Skin is warm and dry.     Capillary Refill: Capillary refill takes less than 2 seconds.  Neurological:     General: No focal deficit present.     Mental Status: She is alert and oriented to person, place, and time.  Psychiatric:        Mood and Affect: Mood normal.        Behavior: Behavior normal.     Results for orders placed or performed in visit on 05/05/23  POCT rapid strep A  Result Value Ref Range   Rapid Strep A Screen Negative Negative        Assessment & Plan:  Viral URI -     predniSONE; Take 2 tablets (40 mg total) by mouth daily for 5 days.  Dispense: 10 tablet; Refill: 0 -     Promethazine-DM; Take 5 mLs by mouth 4 (four) times daily as needed for cough.  Dispense: 118 mL; Refill: 0  Sore throat -     POCT rapid strep A   Patient appears stable today. Benign exam. Strep test negative. Likely  self-resolving viral infection. Supportive care reviewed with patient. Short burst of prednisone due to history of sarcoid and some shortness of breath today. Promethazine DM for cough. Discussed with patient that there are no indications for antibiotics at this time, and viral respiratory illness can be persistent in duration.Tylenol or ibuprofen for pain or fever as needed.  Patient instructed to return to clinic if worsening shortness of breath, chest pain, hypoxia, or other concerns. Patient agreeable to plan.    Return if symptoms worsen or fail to improve.  Toni Amend Antaeus Karel, PA-C

## 2023-08-18 ENCOUNTER — Encounter: Payer: Self-pay | Admitting: Physician Assistant

## 2023-08-18 ENCOUNTER — Telehealth: Payer: PRIVATE HEALTH INSURANCE | Admitting: Physician Assistant

## 2023-08-18 ENCOUNTER — Ambulatory Visit: Payer: Self-pay

## 2023-08-18 DIAGNOSIS — K219 Gastro-esophageal reflux disease without esophagitis: Secondary | ICD-10-CM | POA: Diagnosis not present

## 2023-08-18 NOTE — Progress Notes (Signed)
 c  Virtual Visit via Video Note  I connected with Kathleen Key on 08/18/23 at 11:20 AM EDT by a video enabled telemedicine application and verified that I am speaking with the correct person using two identifiers.  Patient Location: Home Provider Location: Office/Clinic  I discussed the limitations, risks, security, and privacy concerns of performing an evaluation and management service by video and the availability of in person appointments. I also discussed with the patient that there may be a patient responsible charge related to this service. The patient expressed understanding and agreed to proceed.  Subjective: PCP: Cook, Jayce G, DO  No chief complaint on file.  Patient presents today with complaints of heartburn and 2 episodes of vomiting over the last 12 hours. She reports symptoms have now resolved. Patient reports increased heartburn starting around 10pm last night. She states she took Tums for symptoms. She relates 2 episodes of vomiting since 5:30am. She states symptoms have now improved and she is feeling much better. She denies nausea, abdominal pain, or changes in bowel movements.      ROS: Per HPI  Current Outpatient Medications:    albuterol  (VENTOLIN  HFA) 108 (90 Base) MCG/ACT inhaler, Inhale 2 puffs into the lungs every 6 (six) hours as needed for breathing., Disp: 18 g, Rfl: 12   amphetamine -dextroamphetamine  (ADDERALL  XR) 20 MG 24 hr capsule, Take 2 capsules (40 mg total) by mouth daily., Disp: 60 capsule, Rfl: 0   amphetamine -dextroamphetamine  (ADDERALL  XR) 20 MG 24 hr capsule, Take 2 capsules (40 mg total) by mouth daily., Disp: 60 capsule, Rfl: 0   amphetamine -dextroamphetamine  (ADDERALL  XR) 20 MG 24 hr capsule, Take 2 capsules (40 mg total) by mouth daily. (02-11-22), Disp: 60 capsule, Rfl: 0   ARIPiprazole  (ABILIFY ) 2 MG tablet, Take 1 tablet (2 mg total) by mouth in the morning. (Patient taking differently: Take 5 mg by mouth in the morning.), Disp: 90 tablet,  Rfl: 0   cloNIDine HCl (KAPVAY) 0.1 MG TB12 ER tablet, Take by mouth., Disp: , Rfl:    estradiol  (ESTRACE ) 2 MG tablet, Take 2 tablets (4 mg total) by mouth daily., Disp: 180 tablet, Rfl: 3   FAMOTIDINE PO, Take by mouth., Disp: , Rfl:    loratadine (CLARITIN) 10 MG tablet, Take 10 mg by mouth daily., Disp: , Rfl:    Multiple Vitamins-Minerals (MULTI FOR HER 50+ PO), 1 tablet, Disp: , Rfl:    prazosin  (MINIPRESS ) 5 MG capsule, Take 5 mg by mouth at bedtime., Disp: , Rfl:    progesterone  (PROMETRIUM ) 100 MG capsule, Take 2 capsules (200 mg total) by mouth daily., Disp: 180 capsule, Rfl: 4   promethazine -dextromethorphan (PROMETHAZINE -DM) 6.25-15 MG/5ML syrup, Take 5 mLs by mouth 4 (four) times daily as needed for cough., Disp: 118 mL, Rfl: 0   venlafaxine  XR (EFFEXOR -XR) 150 MG 24 hr capsule, Take 1 capsule (150 mg total) by mouth daily., Disp: 90 capsule, Rfl: 0  Observations/Objective: There were no vitals filed for this visit. Physical Exam Constitutional:      Appearance: Normal appearance.  Eyes:     Extraocular Movements: Extraocular movements intact.  Pulmonary:     Effort: Pulmonary effort is normal.  Musculoskeletal:     Cervical back: Normal range of motion.  Neurological:     General: No focal deficit present.     Mental Status: She is alert.  Psychiatric:        Mood and Affect: Mood normal.     Assessment and Plan: Gastroesophageal reflux disease without  esophagitis Assessment & Plan: Patient presents today with increased symptoms, including heart burn and vomiting. Advised patient to take famotidine daily for best control of symptoms. Reassurance given. Patient to follow up for new or worsening symptoms.      Follow Up Instructions: No follow-ups on file.   I discussed the assessment and treatment plan with the patient. The patient was provided an opportunity to ask questions, and all were answered. The patient agreed with the plan and demonstrated an  understanding of the instructions.   The patient was advised to call back or seek an in-person evaluation if the symptoms worsen or if the condition fails to improve as anticipated.  The above assessment and management plan was discussed with the patient. The patient verbalized understanding of and has agreed to the management plan.   Jearlean Mince Jafari Mckillop, PA-C

## 2023-08-18 NOTE — Telephone Encounter (Signed)
  Chief Complaint: Vomiting 2x, also nausea and heartburn Symptoms: above Frequency: since last night Pertinent Negatives: Patient denies numbness, chest pain Disposition: [] ED /[] Urgent Care (no appt availability in office) / [x] Appointment(In office/virtual)/ []  [facility]/ [] Home Care/ [] Refused Recommended Disposition /[] [facility]/ []  Follow-up with PCP Additional Notes: Pt reports nausea and vomiting 2x. Pt started with nausea last night at 4am after eating peanut butter crackers. Pt took some pepto bismol at 5:30 am and vomited shortly after. About 1 hour later pt ate some crackers and ginger ale, and vomited again. Pt states she is very weak from vomiting and would like a VV today. Scheduled VV for pt today. Updated Email address as requested.     Copied from CRM [id number]. Topic: Clinical - Red Word Triage >> Aug 18, 2023  8:21 AM Jorie Newness J wrote: [facility] that prompted transfer to Nurse Triage:  severe heart burn, vomiting unable to keep food down Reason for Disposition  [1] MILD or MODERATE vomiting AND [2] present > 48 hours (2 days) (Exception: Mild vomiting with associated diarrhea.)  Answer Assessment - Initial Assessment Questions 1. VOMITING SEVERITY: "How many times have you vomited in the past 24 hours?"     - MILD:  1 - 2 times/day    - MODERATE: 3 - 5 times/day, decreased oral intake without significant weight loss or symptoms of dehydration    - SEVERE: 6 or more times/day, vomits everything or nearly everything, with significant weight loss, symptoms of dehydration      2x 2. ONSET: "When did the vomiting begin?"      4 am nausea 3. FLUIDS: "What fluids or food have you vomited up today?" "Have you been able to keep any fluids down?"      4. ABDOMEN PAIN: "Are your having any abdomen pain?" If Yes : "How bad is it and what does it feel like?" (e.g., crampy, dull, intermittent, constant)      heartburn 5. DIARRHEA: "Is there any diarrhea?" If  Yes, ask: "How many times today?"      No - but has had BMs 6. CONTACTS: "Is there anyone else in the family with the same symptoms?"      no 7. CAUSE: "What do you think is causing your vomiting?"     unsure 9. OTHER SYMPTOMS: "Do you have any other symptoms?" (e.g., fever, headache, vertigo, vomiting blood or coffee grounds, recent head injury)     no  Protocols used: Vomiting-A-AH

## 2023-08-18 NOTE — Assessment & Plan Note (Signed)
 Patient presents today with increased symptoms, including heart burn and vomiting. Advised patient to take famotidine daily for best control of symptoms. Reassurance given. Patient to follow up for new or worsening symptoms.

## 2024-01-19 ENCOUNTER — Ambulatory Visit: Payer: PRIVATE HEALTH INSURANCE | Admitting: Orthopedic Surgery

## 2024-01-19 ENCOUNTER — Other Ambulatory Visit (INDEPENDENT_AMBULATORY_CARE_PROVIDER_SITE_OTHER): Payer: PRIVATE HEALTH INSURANCE

## 2024-01-19 DIAGNOSIS — M1712 Unilateral primary osteoarthritis, left knee: Secondary | ICD-10-CM | POA: Diagnosis not present

## 2024-01-19 DIAGNOSIS — G8929 Other chronic pain: Secondary | ICD-10-CM

## 2024-01-19 DIAGNOSIS — M25562 Pain in left knee: Secondary | ICD-10-CM | POA: Diagnosis not present

## 2024-01-19 DIAGNOSIS — F3341 Major depressive disorder, recurrent, in partial remission: Secondary | ICD-10-CM | POA: Insufficient documentation

## 2024-01-19 NOTE — Progress Notes (Signed)
     01/19/2024   Chief Complaint  Patient presents with   Knee Pain    Left- better now after I made the appointment but before my knee cap and posterior side of knee was really hurting and bothered me when I was sleeping saw you last for this 10/2021    What pharmacy do you use ? [facility]  Patient: Kathleen Key           Date of Birth: September 06, 1965           MRN: [id number] Visit Date: 01/19/2024 Requested by: Cook, Jayce G, DO [address] [place],  [place] [address] PCP: Cook, Jayce G, DO    Encounter Diagnosis  Name Primary?   Acute pain of left knee Yes   DG Knee AP/LAT W/Sunrise Left Result Date: 01/19/2024 X-ray report bilateral knees AP and then lateral and sunrise left knee.  Alignment increased valgus alignment left knee without significant joint space narrowing.  Critically looking there may be some mild medial joint space narrowing.  May have a small slight effusion Patellofemoral alignment is normal no osteophytes are seen. Impression grade 1 arthritis of the knee     Plan:  Patient can resume normal activities and let us  know if the knee bothers her anymore.  Right now we do not see any increase in arthritis she is asymptomatic her exam is relatively benign  History of present illness:  58 year old female last seen in 2023 had a knee injection for lateral knee pain.  We did do an MRI back then and she had a small amount of arthritis and some degeneration in the meniscus but no obvious tear.  Comes in today after an acute episode of increased pain lateral and patellofemoral area left knee with normal symptoms of intermittent aching  Knee Pain  Pertinent negatives include no tingling.   Problem list, medical hx, medications and allergies reviewed   Physical exam  Left knee.  No swelling or effusion  Range of motion is limited to 110 degrees knee feels stable ligaments are normal strength and muscle tone intact without  atrophy neurovascular exam normal skin no abrasions on the left Rasor scratch on the right lower extremity  Review of Systems  Constitutional:  Negative for fever.  Respiratory:  Negative for shortness of breath.   Cardiovascular:  Negative for chest pain.  Skin: Negative.   Neurological:  Negative for tingling and sensory change.     Allergies  Allergen Reactions   Dilaudid  [Hydromorphone  Hcl] Itching   Excedrin Back & [Acetaminophen -Aspirin Buffered] Other (See Comments)    SUGAR-SPIKE, NERVOUSNESS   Oxycodone  Itching    percocet   Tramadol  Itching    LMP 10/08/2012

## 2024-01-19 NOTE — Progress Notes (Signed)
     01/19/2024   Chief Complaint  Patient presents with   Knee Pain    Left- better now after I made the appointment but before my knee cap and posterior side of knee was really hurting and bothered me when I was sleeping saw you last for this 10/2021    What pharmacy do you use ? [facility]  Patient: Kathleen Key           Date of Birth: [birth year]           MRN: [id number] Visit Date: 01/19/2024 Requested by: Cook, Jayce G, DO [address] [place],  [place] [address] PCP: Cook, Jayce G, DO    Encounter Diagnosis  Name Primary?   Acute pain of left knee Yes    Plan:     History of present illness:  58 year old female last seen in 2023 had a knee injection for lateral knee pain.  We did do an MRI back then and she had a small amount of arthritis and some degeneration in the meniscus but no obvious tear.  Comes in today after an acute episode of increased pain lateral and patellofemoral area left knee with normal symptoms of intermittent aching  Knee Pain  Pertinent negatives include no tingling.   Problem list, medical hx, medications and allergies reviewed   Physical exam  Left knee.  No swelling or effusion  Range of motion is limited to 110 degrees knee feels stable ligaments are normal strength and muscle tone intact without atrophy neurovascular exam normal skin no abrasions on the left Rasor scratch on the right lower extremity  Review of Systems  Constitutional:  Negative for fever.  Respiratory:  Negative for shortness of breath.   Cardiovascular:  Negative for chest pain.  Skin: Negative.   Neurological:  Negative for tingling and sensory change.     Allergies  Allergen Reactions   Dilaudid  [Hydromorphone  Hcl] Itching   Excedrin Back & [Acetaminophen -Aspirin Buffered] Other (See Comments)    SUGAR-SPIKE, NERVOUSNESS   Oxycodone  Itching    percocet   Tramadol  Itching    LMP 10/08/2012

## 2024-03-26 ENCOUNTER — Ambulatory Visit: Payer: PRIVATE HEALTH INSURANCE | Admitting: Physician Assistant

## 2024-03-29 ENCOUNTER — Ambulatory Visit: Payer: PRIVATE HEALTH INSURANCE

## 2024-03-29 VITALS — BP 148/73 | HR 101 | Temp 98.2°F | Ht 64.0 in | Wt 137.5 lb

## 2024-03-29 DIAGNOSIS — L239 Allergic contact dermatitis, unspecified cause: Secondary | ICD-10-CM | POA: Diagnosis not present

## 2024-03-29 DIAGNOSIS — L739 Follicular disorder, unspecified: Secondary | ICD-10-CM | POA: Diagnosis not present

## 2024-03-29 DIAGNOSIS — L2082 Flexural eczema: Secondary | ICD-10-CM | POA: Diagnosis not present

## 2024-03-29 MED ORDER — CETIRIZINE HCL 10 MG PO TABS: 10.0000 mg | ORAL_TABLET | Freq: Every day | ORAL | 5 refills | Status: AC

## 2024-03-29 MED ORDER — TRIAMCINOLONE ACETONIDE 0.1 % EX CREA: 1.0000 | TOPICAL_CREAM | Freq: Two times a day (BID) | CUTANEOUS | 0 refills | Status: AC

## 2024-03-30 NOTE — Progress Notes (Signed)
 "  Acute Office Visit  Subjective:     Patient ID: ZAKARIAH DEJARNETTE, female    DOB: Dec 14, 1965, 58 y.o.   MRN: [id number]  Chief Complaint  Patient presents with   Rash    Patient is here for having a consistent itch. Was having some type of red rash but have scratched it til raw in certain areas.   Patient stated it has been going on for 2 1/2 weeks now     Rash Pertinent negatives include no cough, fatigue, fever or shortness of breath.   Patient is in today for itching and burning on her whole body that has been occurring for 2 1/2 weeks now. Was using a new laundry sanitizer and a new deodorant about a month ago, but stopped using since the itching started. Says that she had a rash appear and describes it as small red dots, but has went away over time. Reports that the itching has not gotten better since eliminating the laundry sanitizer and deodorant in daily routine. Denies fever or any associating symptoms. Takes Claritin and famotidine daily.   Also has two small red bumps on her left armpit that resembles lymph nodes. Says that she cut her skin shaving, and afterwards two red bumps popped up.   Review of Systems  Constitutional:  Negative for chills, fatigue and fever.  Respiratory:  Negative for cough, chest tightness, shortness of breath and wheezing.   Cardiovascular:  Negative for chest pain and palpitations.  Skin:  Positive for rash.  Allergic/Immunologic: Positive for environmental allergies.       Objective:    Today's Vitals   03/29/24 1402  BP: (!) 148/73  Pulse: (!) 101  Temp: 98.2 F (36.8 C)  SpO2: 99%  Weight: 137 lb 8 oz (62.4 kg)  Height: 5' 4 (1.626 m)   Body mass index is 23.6 kg/m.   Physical Exam Vitals and nursing note reviewed.  Constitutional:      General: She is not in acute distress.    Appearance: Normal appearance. She is not ill-appearing.  Cardiovascular:     Rate and Rhythm: Normal rate and regular rhythm.     Heart sounds:  Normal heart sounds, S1 normal and S2 normal. No murmur heard. Pulmonary:     Effort: Pulmonary effort is normal. No respiratory distress.     Breath sounds: Normal breath sounds. No wheezing.  Lymphadenopathy:     Cervical: No cervical adenopathy.  Skin:    Comments: Left axilla: Two small superficial papular lesions noted, consistent with folliculitis. Each lesion has a small underlying mobile nodule. Nodules are superficial, non-fixed, and not located in typical axillary lymph node regions. Findings are not consistent with lymphadenopathy.   Diffuse xerosis noted. Eczematous changes present in the flexural surfaces of the bilateral elbows. Scattered follicular papules on bilateral lower extremities consistent with folliculitis.   Neurological:     Mental Status: She is alert.  Psychiatric:        Mood and Affect: Mood normal.        Behavior: Behavior normal.        Thought Content: Thought content normal.        Judgment: Judgment normal.    No results found for any visits on 03/29/24.    Assessment & Plan:  1. Allergic dermatitis -Likely secondary to laundry sanitizer and deodorant exposure.  -Advised patient to discontinue use of offending products.  -Counseled that residual laundry sanitizer may persist in clothing and may take  multiple washes to fully remove.  -Recommended cetirizine and continued use of famotidine for symptomatic relief of pruritus.  -Educated patient on appropriate use and potential side effects.   - cetirizine (ZYRTEC) 10 MG tablet; Take 1 tablet (10 mg total) by mouth daily.  Dispense: 30 tablet; Refill: 5  2. Flexural eczema (Primary) -Prescribed triamcinolone  topical cream; instructions provided for appropriate use. -Advised that this cream is to only be used for short term use only, and to not use longer 2 weeks. Then on an as needed basis after 2 weeks. Patient agrees.  -Counseled on the importance of moisturizing skin and avoiding hot baths with  eczema flare-ups. Recommended Cerave or Cetaphil moisturizer for skin.  - triamcinolone  cream (KENALOG ) 0.1 %; Apply 1 Application topically 2 (two) times daily.  Dispense: 30 g; Refill: 0  3. Folliculitis -Provided education on folliculitis, including gentle skin care, and avoidance of shaving until resolution. -Advised to monitor for signs of infection or worsening symptoms.   Return if symptoms worsen or fail to improve.   Damien KATHEE Pringle, FNP  "

## 2024-04-12 ENCOUNTER — Ambulatory Visit: Payer: Self-pay

## 2024-04-12 NOTE — Telephone Encounter (Signed)
 FYI Only or Action Required?: Action required by provider: request for appointment and ED Refusal.  Patient was last seen in primary care on 03/29/2024 by Gerard Damien NOVAK, FNP.  Called Nurse Triage reporting Abscess.  Symptoms began several days ago.  Interventions attempted: Rest, hydration, or home remedies.  Symptoms are: unchanged.  Triage Disposition: Go to ED Now (or PCP Triage)  Patient/caregiver understands and will follow disposition?: No, wishes to speak with PCP           Copied from CRM #[id number]. Topic: Clinical - Red Word Triage >> Apr 12, 2024 12:54 PM Kevelyn M wrote: Red Word that prompted transfer to Nurse Triage: Right Leg has a sore. It is swollen and it was hot to touch from Wednesday to yesterday. Now it's just swollen. Reason for Disposition  Black (necrotic), dark purple, or blisters develop in area of boil (abscess)  Answer Assessment - Initial Assessment Questions Area has been on leg for about five days Patient states area on her leg was red and swollen/hot to touch. This area is on on her right lower leg anterior area. Patient put salt on it last night and scab over---around a dime sized  Patient denies fever, difficulty breathing, chest pain, injuries Patient states minimal pain at this time Patient did state that the area was starting to look black and nasty at one point. She states since it has started to scab over it doesn't look as bad. She is advised if this area of her skin is turning black/dark purple it would be recommended to have it evaluated at the Emergency Room. 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 She wanted this message sent to her PCP and requests an office visit. Patient currently has this area covered  Patient is advised to call us  back  if anything changes or with any further questions/concerns. Patient is advised that if anything worsens to go to the Emergency Room. Patient verbalized understanding.  Protocols used: Boil (Skin Abscess)-A-AH

## 2024-04-12 NOTE — Telephone Encounter (Signed)
 Consulted with Dr Bluford who stated the patient needs to go to the ER for evaluation and treatment. Patient was notified and advised of provider's recommendations and verbalized understanding.

## 2024-04-12 NOTE — Telephone Encounter (Signed)
 Called [facility] and advised [facility] of ER Refusal at this time.

## 2024-04-13 ENCOUNTER — Other Ambulatory Visit: Payer: Self-pay | Admitting: *Deleted

## 2024-04-13 DIAGNOSIS — Z79890 Hormone replacement therapy (postmenopausal): Secondary | ICD-10-CM

## 2024-04-13 DIAGNOSIS — N951 Menopausal and female climacteric states: Secondary | ICD-10-CM

## 2024-04-14 MED ORDER — ESTRADIOL 2 MG PO TABS: 4.0000 mg | ORAL_TABLET | Freq: Every day | ORAL | 3 refills | Status: AC

## 2024-04-26 ENCOUNTER — Ambulatory Visit: Payer: PRIVATE HEALTH INSURANCE | Admitting: Obstetrics & Gynecology

## 2024-04-26 ENCOUNTER — Encounter: Payer: Self-pay | Admitting: *Deleted

## 2024-04-28 ENCOUNTER — Ambulatory Visit: Payer: PRIVATE HEALTH INSURANCE | Admitting: Obstetrics & Gynecology

## 2024-06-09 ENCOUNTER — Ambulatory Visit: Payer: PRIVATE HEALTH INSURANCE | Admitting: Obstetrics & Gynecology
